# Patient Record
Sex: Female | Born: 1950
Health system: Southern US, Community
[De-identification: ages and names within clinical notes are randomized; demographics above are authoritative.]

## PROBLEM LIST (undated history)

## (undated) DIAGNOSIS — E785 Hyperlipidemia, unspecified: Secondary | ICD-10-CM

## (undated) DIAGNOSIS — I10 Essential (primary) hypertension: Secondary | ICD-10-CM

## (undated) DIAGNOSIS — R87629 Unspecified abnormal cytological findings in specimens from vagina: Secondary | ICD-10-CM

## (undated) DIAGNOSIS — R112 Nausea with vomiting, unspecified: Secondary | ICD-10-CM

## (undated) DIAGNOSIS — M1990 Unspecified osteoarthritis, unspecified site: Secondary | ICD-10-CM

## (undated) DIAGNOSIS — K219 Gastro-esophageal reflux disease without esophagitis: Secondary | ICD-10-CM

## (undated) DIAGNOSIS — E876 Hypokalemia: Secondary | ICD-10-CM

## (undated) DIAGNOSIS — Z9889 Other specified postprocedural states: Secondary | ICD-10-CM

## (undated) DIAGNOSIS — K635 Polyp of colon: Secondary | ICD-10-CM

## (undated) HISTORY — PX: TUBAL LIGATION: SHX77

## (undated) HISTORY — PX: TOE SURGERY: SHX1073

## (undated) HISTORY — DX: Essential (primary) hypertension: I10

## (undated) HISTORY — DX: Unspecified osteoarthritis, unspecified site: M19.90

## (undated) HISTORY — DX: Hyperlipidemia, unspecified: E78.5

## (undated) HISTORY — PX: BREAST BIOPSY: SHX20

## (undated) HISTORY — PX: BREAST SURGERY: SHX581

## (undated) HISTORY — DX: Hypokalemia: E87.6

## (undated) HISTORY — PX: NEUROMA SURGERY: SHX722

## (undated) HISTORY — DX: Unspecified abnormal cytological findings in specimens from vagina: R87.629

## (undated) HISTORY — PX: ABDOMINAL HYSTERECTOMY: SHX81

## (undated) HISTORY — PX: HYSTEROSCOPY: SHX211

## (undated) HISTORY — PX: HAMMER TOE SURGERY: SHX385

## (undated) HISTORY — PX: DILATION AND CURETTAGE OF UTERUS: SHX78

## (undated) HISTORY — PX: APPENDECTOMY: SHX54

---

## 1997-08-12 ENCOUNTER — Other Ambulatory Visit: Admission: RE | Admit: 1997-08-12 | Discharge: 1997-08-12 | Payer: Self-pay | Admitting: *Deleted

## 1998-04-19 ENCOUNTER — Ambulatory Visit (HOSPITAL_COMMUNITY): Admission: RE | Admit: 1998-04-19 | Discharge: 1998-04-19 | Payer: Self-pay | Admitting: *Deleted

## 1998-04-19 ENCOUNTER — Encounter: Payer: Self-pay | Admitting: *Deleted

## 1999-04-21 ENCOUNTER — Encounter: Payer: Self-pay | Admitting: Internal Medicine

## 1999-04-21 ENCOUNTER — Ambulatory Visit (HOSPITAL_COMMUNITY): Admission: RE | Admit: 1999-04-21 | Discharge: 1999-04-21 | Payer: Self-pay | Admitting: Internal Medicine

## 2000-05-02 ENCOUNTER — Encounter: Payer: Self-pay | Admitting: *Deleted

## 2000-05-02 ENCOUNTER — Ambulatory Visit (HOSPITAL_COMMUNITY): Admission: RE | Admit: 2000-05-02 | Discharge: 2000-05-02 | Payer: Self-pay | Admitting: *Deleted

## 2000-09-03 ENCOUNTER — Other Ambulatory Visit: Admission: RE | Admit: 2000-09-03 | Discharge: 2000-09-03 | Payer: Self-pay | Admitting: Obstetrics and Gynecology

## 2001-05-07 ENCOUNTER — Encounter: Admission: RE | Admit: 2001-05-07 | Discharge: 2001-05-07 | Payer: Self-pay | Admitting: Internal Medicine

## 2001-05-07 ENCOUNTER — Encounter: Payer: Self-pay | Admitting: Internal Medicine

## 2001-09-04 ENCOUNTER — Other Ambulatory Visit: Admission: RE | Admit: 2001-09-04 | Discharge: 2001-09-04 | Payer: Self-pay | Admitting: Obstetrics and Gynecology

## 2002-05-08 ENCOUNTER — Encounter: Admission: RE | Admit: 2002-05-08 | Discharge: 2002-05-08 | Payer: Self-pay | Admitting: Internal Medicine

## 2002-05-08 ENCOUNTER — Encounter: Payer: Self-pay | Admitting: Internal Medicine

## 2002-09-09 ENCOUNTER — Other Ambulatory Visit: Admission: RE | Admit: 2002-09-09 | Discharge: 2002-09-09 | Payer: Self-pay | Admitting: *Deleted

## 2003-05-11 ENCOUNTER — Encounter: Admission: RE | Admit: 2003-05-11 | Discharge: 2003-05-11 | Payer: Self-pay | Admitting: Internal Medicine

## 2003-09-10 ENCOUNTER — Other Ambulatory Visit: Admission: RE | Admit: 2003-09-10 | Discharge: 2003-09-10 | Payer: Self-pay | Admitting: Obstetrics and Gynecology

## 2004-01-21 ENCOUNTER — Ambulatory Visit: Payer: Self-pay | Admitting: Internal Medicine

## 2004-02-03 ENCOUNTER — Other Ambulatory Visit: Admission: RE | Admit: 2004-02-03 | Discharge: 2004-02-03 | Payer: Self-pay | Admitting: Obstetrics & Gynecology

## 2004-02-03 ENCOUNTER — Other Ambulatory Visit: Admission: RE | Admit: 2004-02-03 | Discharge: 2004-02-03 | Payer: Self-pay | Admitting: Obstetrics and Gynecology

## 2004-04-25 ENCOUNTER — Encounter: Admission: RE | Admit: 2004-04-25 | Discharge: 2004-04-25 | Payer: Self-pay | Admitting: Internal Medicine

## 2004-05-13 ENCOUNTER — Encounter: Admission: RE | Admit: 2004-05-13 | Discharge: 2004-05-13 | Payer: Self-pay | Admitting: Internal Medicine

## 2004-06-27 ENCOUNTER — Other Ambulatory Visit: Admission: RE | Admit: 2004-06-27 | Discharge: 2004-06-27 | Payer: Self-pay | Admitting: Obstetrics & Gynecology

## 2004-12-26 ENCOUNTER — Other Ambulatory Visit: Admission: RE | Admit: 2004-12-26 | Discharge: 2004-12-26 | Payer: Self-pay | Admitting: Obstetrics & Gynecology

## 2005-01-23 ENCOUNTER — Ambulatory Visit: Payer: Self-pay | Admitting: Internal Medicine

## 2005-05-19 ENCOUNTER — Encounter: Admission: RE | Admit: 2005-05-19 | Discharge: 2005-05-19 | Payer: Self-pay | Admitting: Internal Medicine

## 2005-06-27 ENCOUNTER — Other Ambulatory Visit: Admission: RE | Admit: 2005-06-27 | Discharge: 2005-06-27 | Payer: Self-pay | Admitting: Obstetrics & Gynecology

## 2005-07-31 ENCOUNTER — Encounter: Admission: RE | Admit: 2005-07-31 | Discharge: 2005-07-31 | Payer: Self-pay | Admitting: Obstetrics & Gynecology

## 2005-10-05 ENCOUNTER — Ambulatory Visit (HOSPITAL_COMMUNITY): Admission: RE | Admit: 2005-10-05 | Discharge: 2005-10-05 | Payer: Self-pay | Admitting: Obstetrics & Gynecology

## 2005-10-05 ENCOUNTER — Encounter (INDEPENDENT_AMBULATORY_CARE_PROVIDER_SITE_OTHER): Payer: Self-pay | Admitting: *Deleted

## 2005-12-18 ENCOUNTER — Encounter (INDEPENDENT_AMBULATORY_CARE_PROVIDER_SITE_OTHER): Payer: Self-pay | Admitting: *Deleted

## 2005-12-18 LAB — CONVERTED CEMR LAB

## 2006-01-24 ENCOUNTER — Ambulatory Visit: Payer: Self-pay | Admitting: Internal Medicine

## 2006-01-24 LAB — CONVERTED CEMR LAB
ALT: 28 units/L (ref 0–40)
Alkaline Phosphatase: 64 units/L (ref 39–117)
BUN: 14 mg/dL (ref 6–23)
Calcium: 9.8 mg/dL (ref 8.4–10.5)
Chloride: 102 meq/L (ref 96–112)
Cholesterol: 150 mg/dL (ref 0–200)
GFR calc non Af Amer: 70 mL/min
Glomerular Filtration Rate, Af Am: 84 mL/min/{1.73_m2}
HDL: 55 mg/dL (ref >39.0)
Hemoglobin: 14.7 g/dL (ref 12.0–15.0)
LDL Cholesterol: 86 mg/dL (ref 0–99)
Monocytes Absolute: 0.3 10*3/uL (ref 0.2–0.7)
Monocytes Relative: 5.4 % (ref 3.0–11.0)
Neutro Abs: 3.2 10*3/uL (ref 1.4–7.7)
RDW: 11 % — ABNORMAL LOW (ref 11.5–14.6)
Sodium: 139 meq/L (ref 135–145)
TSH: 1 microintl units/mL (ref 0.35–5.50)
WBC: 4.9 10*3/uL (ref 4.5–10.5)

## 2006-03-20 HISTORY — PX: COLONOSCOPY: SHX174

## 2006-03-28 ENCOUNTER — Encounter: Admission: RE | Admit: 2006-03-28 | Discharge: 2006-03-28 | Payer: Self-pay | Admitting: Internal Medicine

## 2006-05-22 ENCOUNTER — Encounter: Admission: RE | Admit: 2006-05-22 | Discharge: 2006-05-22 | Payer: Self-pay | Admitting: Obstetrics & Gynecology

## 2007-02-04 ENCOUNTER — Encounter: Admission: RE | Admit: 2007-02-04 | Discharge: 2007-02-04 | Payer: Self-pay | Admitting: Obstetrics & Gynecology

## 2007-04-15 ENCOUNTER — Encounter (INDEPENDENT_AMBULATORY_CARE_PROVIDER_SITE_OTHER): Payer: Self-pay | Admitting: *Deleted

## 2007-04-15 DIAGNOSIS — Z8639 Personal history of other endocrine, nutritional and metabolic disease: Secondary | ICD-10-CM

## 2007-04-15 DIAGNOSIS — R87619 Unspecified abnormal cytological findings in specimens from cervix uteri: Secondary | ICD-10-CM | POA: Insufficient documentation

## 2007-04-15 DIAGNOSIS — M1990 Unspecified osteoarthritis, unspecified site: Secondary | ICD-10-CM | POA: Insufficient documentation

## 2007-04-15 DIAGNOSIS — Z862 Personal history of diseases of the blood and blood-forming organs and certain disorders involving the immune mechanism: Secondary | ICD-10-CM | POA: Insufficient documentation

## 2007-04-16 ENCOUNTER — Ambulatory Visit: Payer: Self-pay | Admitting: Internal Medicine

## 2007-04-16 DIAGNOSIS — N926 Irregular menstruation, unspecified: Secondary | ICD-10-CM | POA: Insufficient documentation

## 2007-04-16 DIAGNOSIS — E785 Hyperlipidemia, unspecified: Secondary | ICD-10-CM | POA: Insufficient documentation

## 2007-04-16 DIAGNOSIS — T887XXA Unspecified adverse effect of drug or medicament, initial encounter: Secondary | ICD-10-CM | POA: Insufficient documentation

## 2007-04-16 DIAGNOSIS — I10 Essential (primary) hypertension: Secondary | ICD-10-CM | POA: Insufficient documentation

## 2007-04-22 ENCOUNTER — Encounter (INDEPENDENT_AMBULATORY_CARE_PROVIDER_SITE_OTHER): Payer: Self-pay | Admitting: *Deleted

## 2007-04-29 ENCOUNTER — Encounter (INDEPENDENT_AMBULATORY_CARE_PROVIDER_SITE_OTHER): Payer: Self-pay | Admitting: *Deleted

## 2007-05-07 ENCOUNTER — Ambulatory Visit: Payer: Self-pay | Admitting: Internal Medicine

## 2007-05-23 ENCOUNTER — Encounter: Admission: RE | Admit: 2007-05-23 | Discharge: 2007-05-23 | Payer: Self-pay | Admitting: Internal Medicine

## 2007-08-05 ENCOUNTER — Telehealth (INDEPENDENT_AMBULATORY_CARE_PROVIDER_SITE_OTHER): Payer: Self-pay | Admitting: *Deleted

## 2007-08-07 ENCOUNTER — Encounter: Admission: RE | Admit: 2007-08-07 | Discharge: 2007-08-07 | Payer: Self-pay | Admitting: Radiology

## 2007-08-19 ENCOUNTER — Ambulatory Visit: Payer: Self-pay | Admitting: Internal Medicine

## 2007-08-27 ENCOUNTER — Encounter: Payer: Self-pay | Admitting: Internal Medicine

## 2007-09-18 ENCOUNTER — Ambulatory Visit (HOSPITAL_COMMUNITY): Admission: RE | Admit: 2007-09-18 | Discharge: 2007-09-19 | Payer: Self-pay | Admitting: Surgery

## 2007-09-18 ENCOUNTER — Encounter (INDEPENDENT_AMBULATORY_CARE_PROVIDER_SITE_OTHER): Payer: Self-pay | Admitting: Surgery

## 2007-09-18 HISTORY — PX: CHOLECYSTECTOMY: SHX55

## 2007-10-21 ENCOUNTER — Encounter: Payer: Self-pay | Admitting: Internal Medicine

## 2008-02-06 ENCOUNTER — Ambulatory Visit: Payer: Self-pay | Admitting: Family Medicine

## 2008-02-24 ENCOUNTER — Encounter: Admission: RE | Admit: 2008-02-24 | Discharge: 2008-02-24 | Payer: Self-pay | Admitting: Obstetrics & Gynecology

## 2008-03-20 HISTORY — PX: NEURECTOMY FOOT: SUR888

## 2008-03-23 ENCOUNTER — Encounter: Admission: RE | Admit: 2008-03-23 | Discharge: 2008-03-23 | Payer: Self-pay

## 2008-04-22 ENCOUNTER — Ambulatory Visit: Payer: Self-pay | Admitting: Internal Medicine

## 2008-04-22 DIAGNOSIS — E559 Vitamin D deficiency, unspecified: Secondary | ICD-10-CM | POA: Insufficient documentation

## 2008-04-27 ENCOUNTER — Encounter (INDEPENDENT_AMBULATORY_CARE_PROVIDER_SITE_OTHER): Payer: Self-pay | Admitting: *Deleted

## 2008-04-28 ENCOUNTER — Telehealth (INDEPENDENT_AMBULATORY_CARE_PROVIDER_SITE_OTHER): Payer: Self-pay | Admitting: *Deleted

## 2008-04-29 ENCOUNTER — Ambulatory Visit: Payer: Self-pay | Admitting: Internal Medicine

## 2008-04-29 LAB — CONVERTED CEMR LAB: OCCULT 3: NEGATIVE

## 2008-04-30 ENCOUNTER — Encounter (INDEPENDENT_AMBULATORY_CARE_PROVIDER_SITE_OTHER): Payer: Self-pay | Admitting: *Deleted

## 2008-05-25 ENCOUNTER — Encounter: Admission: RE | Admit: 2008-05-25 | Discharge: 2008-05-25 | Payer: Self-pay | Admitting: Internal Medicine

## 2008-05-29 ENCOUNTER — Encounter: Admission: RE | Admit: 2008-05-29 | Discharge: 2008-05-29 | Payer: Self-pay | Admitting: Internal Medicine

## 2008-08-27 ENCOUNTER — Ambulatory Visit: Payer: Self-pay | Admitting: Internal Medicine

## 2008-08-31 LAB — CONVERTED CEMR LAB
ALT: 25 units/L (ref 0–35)
AST: 30 units/L (ref 0–37)
Alkaline Phosphatase: 57 units/L (ref 39–117)
Cholesterol: 175 mg/dL (ref 0–200)
HDL: 61 mg/dL (ref >39.00)
LDL Cholesterol: 103 mg/dL — ABNORMAL HIGH (ref 0–99)
Triglycerides: 54 mg/dL (ref 0.0–149.0)
VLDL: 10.8 mg/dL (ref 0.0–40.0)

## 2008-09-01 ENCOUNTER — Encounter (INDEPENDENT_AMBULATORY_CARE_PROVIDER_SITE_OTHER): Payer: Self-pay | Admitting: *Deleted

## 2009-01-28 ENCOUNTER — Telehealth: Payer: Self-pay | Admitting: Internal Medicine

## 2009-03-20 HISTORY — PX: BUNIONECTOMY: SHX129

## 2009-03-23 ENCOUNTER — Encounter: Admission: RE | Admit: 2009-03-23 | Discharge: 2009-03-23 | Payer: Self-pay | Admitting: Obstetrics & Gynecology

## 2009-04-23 ENCOUNTER — Ambulatory Visit: Payer: Self-pay | Admitting: Internal Medicine

## 2009-05-31 ENCOUNTER — Encounter: Admission: RE | Admit: 2009-05-31 | Discharge: 2009-05-31 | Payer: Self-pay | Admitting: Obstetrics & Gynecology

## 2010-04-10 ENCOUNTER — Encounter: Payer: Self-pay | Admitting: Internal Medicine

## 2010-04-17 LAB — CONVERTED CEMR LAB
ALT: 21 units/L (ref 0–35)
ALT: 24 units/L (ref 0–35)
AST: 22 units/L (ref 0–37)
AST: 23 units/L (ref 0–37)
Albumin: 4.2 g/dL (ref 3.5–5.2)
Alkaline Phosphatase: 48 units/L (ref 39–117)
Alkaline Phosphatase: 64 units/L (ref 39–117)
BUN: 13 mg/dL (ref 6–23)
Basophils Absolute: 0 10*3/uL (ref 0.0–0.1)
Basophils Absolute: 0 10*3/uL (ref 0.0–0.1)
Basophils Relative: 0.4 % (ref 0.0–3.0)
Basophils Relative: 0.5 % (ref 0.0–3.0)
Bilirubin, Direct: 0.1 mg/dL (ref 0.0–0.3)
Bilirubin, Direct: 0.1 mg/dL (ref 0.0–0.3)
CO2: 33 meq/L — ABNORMAL HIGH (ref 19–32)
Chloride: 103 meq/L (ref 96–112)
Chloride: 104 meq/L (ref 96–112)
Creatinine, Ser: 0.9 mg/dL (ref 0.4–1.2)
Eosinophils Absolute: 0.1 10*3/uL (ref 0.0–0.6)
Eosinophils Absolute: 0.1 10*3/uL (ref 0.0–0.7)
Eosinophils Relative: 1.2 % (ref 0.0–5.0)
GFR calc Af Amer: 67 mL/min
GFR calc Af Amer: 84 mL/min
GFR calc non Af Amer: 55 mL/min
GFR calc non Af Amer: 60.81 mL/min (ref >60)
Glucose, Bld: 76 mg/dL (ref 70–99)
Glucose, Bld: 87 mg/dL (ref 70–99)
HCT: 44 % (ref 36.0–46.0)
HCT: 44.6 % (ref 36.0–46.0)
HDL goal, serum: 40 mg/dL
HDL: 54.4 mg/dL (ref >39.0)
HDL: 67 mg/dL (ref >39.00)
Hemoglobin: 14.6 g/dL (ref 12.0–15.0)
LDL Goal: 130 mg/dL
LDL Goal: 160 mg/dL
Lymphocytes Relative: 23.7 % (ref 12.0–46.0)
MCHC: 33.3 g/dL (ref 30.0–36.0)
MCHC: 34.6 g/dL (ref 30.0–36.0)
MCV: 98.6 fL (ref 78.0–100.0)
Monocytes Absolute: 0.3 10*3/uL (ref 0.1–1.0)
Monocytes Relative: 6 % (ref 3.0–12.0)
Neutrophils Relative %: 68.7 % (ref 43.0–77.0)
Platelets: 258 10*3/uL (ref 150.0–400.0)
Potassium: 3.8 meq/L (ref 3.5–5.1)
Potassium: 4.1 meq/L (ref 3.5–5.1)
Potassium: 4.2 meq/L (ref 3.5–5.1)
Sodium: 140 meq/L (ref 135–145)
TSH: 0.83 microintl units/mL (ref 0.35–5.50)
Total Bilirubin: 0.8 mg/dL (ref 0.3–1.2)
Total CHOL/HDL Ratio: 3.7
Total Protein: 6.8 g/dL (ref 6.0–8.3)
Triglycerides: 85 mg/dL (ref 0–149)
VLDL: 13.4 mg/dL (ref 0.0–40.0)
VLDL: 17 mg/dL (ref 0–40)
Vit D, 1,25-Dihydroxy: 43 (ref 30–89)
WBC: 5.3 10*3/uL (ref 4.5–10.5)
WBC: 6 10*3/uL (ref 4.5–10.5)

## 2010-04-19 NOTE — Assessment & Plan Note (Signed)
Summary: cpx/alr   Vital Signs:  Patient profile:   60 year old female Height:      62.75 inches Weight:      162.8 pounds BMI:     29.17 Temp:     98.4 degrees F oral Pulse rate:   59 / minute Resp:     14 per minute BP sitting:   118 / 78  (left arm) Cuff size:   large  Vitals Entered By: Shonna Chock (April 23, 2009 8:29 AM) CC: CPX with fasting labs and refill meds , Lipid Management Comments REVIEWED MED LIST, PATIENT AGREED DOSE AND INSTRUCTION CORRECT    Primary Care Provider:  Marga Melnick MD  CC:  CPX with fasting labs and refill meds  and Lipid Management.  History of Present Illness: [name] is here for a physical; she is asymptomatic except for several months of intermittent  L thigh pain @ night which awakens her. Minor daytime symptoms  occasionally. Mobilization  & NSAIDS help.  Lipid Management History:      Positive NCEP/ATP III risk factors include female age 7 years old or older and hypertension.  Negative NCEP/ATP III risk factors include no history of early menopause without estrogen hormone replacement, non-diabetic, HDL cholesterol greater than 60, no family history for ischemic heart disease, non-tobacco-user status, no ASHD (atherosclerotic heart disease), no prior stroke/TIA, no peripheral vascular disease, and no history of aortic aneurysm.     Allergies: 1)  ! Tetracycline 2)  ! * Emycin 3)  ! Maxzide  Past History:  Past Medical History: Hyperlipidemia: NMR : LDL 161(0960/454), HDL 59,TG 69. LDL goal = < 130,ideally < 100. Hypertension history of abnormal pap,mon itored annually by  Dr Jennette Kettle history of hypokalemia degenerative joint disease Vitamin D deficiency( 1600 International Units  daily )  Past Surgical History: Tubal ligation G2 P2 Toe surgery for bone spur Colonoscopy, VIRTUAL  (04/2004) negative Hysteroscopy to assess Prometrium  therapy and dysmenorrhea (09/2005) hysteroscopy  with D and C in  7/07, 12/08 & 03/2008(+ polyp  removal); IUD insertion 09/2008, Dr Jennette Kettle neuroma left foot Dr. Ralene Cork  ,UJ:[id number] injections 2009; Bunionectomy,Hammer Toe ,& Neurectomy  L foot 03/2008; Bunionectomy 03/2009 Cholecystectomy,Lap 09/2007,Dr Gerkin  Family History: Father: CABG 4-vessel, MI @ 29; Mother: deceased Lung CA (former smoker); Siblings: sister morbid obesity,DJD; PGM:  deceased arrhymthmia age 39, MI; PGF:  deceased age 25 CVA; MGM:  breast CA, osteoporosis; MGF:  deceased Lung CA (former smoker); Maternal aunt:  breast CA  Social History: Never Smoked Heart Healthy Diet Occupation: Nurse,[employer] Married Regular exercise-no due to foot surgery  Review of Systems General:  Denies chills, fatigue, fever, sweats, and weight loss. Eyes:  Denies blurring, double vision, and vision loss-both eyes. ENT:  Complains of nasal congestion; denies decreased hearing, ringing in ears, and sinus pressure; Perennial congestion ? from wood dust. CV:  Denies bluish discoloration of lips or nails, chest pain or discomfort, difficulty breathing at night, difficulty breathing while lying down, leg cramps with exertion, palpitations, shortness of breath with exertion, swelling of feet, and swelling of hands. Resp:  Denies cough, shortness of breath, sputum productive, and wheezing. GI:  Denies abdominal pain, bloody stools, dark tarry stools, and indigestion. GU:  Denies discharge, dysuria, and hematuria; Nocturia X 1. MS:  See HPI; Complains of joint pain; denies joint redness, joint swelling, low back pain, mid back pain, and thoracic pain; ? weakness L thigh intermittently. Derm:  Denies changes in nail beds, dryness, hair  loss, lesion(s), and rash. Neuro:  Denies brief paralysis, numbness, and tingling. Psych:  Denies anxiety, depression, easily angered, easily tearful, and irritability. Endo:  Denies cold intolerance, excessive hunger, excessive thirst, excessive urination, and heat intolerance. Heme:  Denies abnormal  bruising and bleeding. Allergy:  Complains of itching eyes, seasonal allergies, and sneezing; Visine & artificial tears as needed .  Physical Exam  General:  well-nourished,in no acute distress; alert,appropriate and cooperative throughout examination Head:  Normocephalic and atraumatic without obvious abnormalities.  Eyes:  No corneal or conjunctival inflammation noted. Perrla. Funduscopic exam benign, without hemorrhages, exudates or papilledema.  Ears:  External ear exam shows no significant lesions or deformities.  Otoscopic examination reveals clear canals, tympanic membranes are intact bilaterally without bulging, retraction, inflammation or discharge. Hearing is grossly normal bilaterally. Nose:  External nasal examination shows no deformity or inflammation. Nasal mucosa are  dry , esp on L ,without lesions or exudates. Mouth:  Oral mucosa and oropharynx without lesions or exudates.  Teeth in good repair. Neck:  No deformities, masses, or tenderness noted. Lungs:  Normal respiratory effort, chest expands symmetrically. Lungs are clear to auscultation, no crackles or wheezes. Heart:  Normal rate and regular rhythm. S1 and S2 normal without gallop, murmur, click, rub . S4 Abdomen:  Bowel sounds positive,abdomen soft and non-tender without masses, organomegaly or hernias noted. Genitalia:  Dr Jennette Kettle Msk:  No deformity or scoliosis noted of thoracic or lumbar spine.   Pulses:  R and L carotid,dorsalis pedis and posterior tibial pulses are full and equal bilaterally. Decreased radial arteries Extremities:  No clubbing, cyanosis, edema  noted with normal full range of motion of all joints.  DIP OA changes & crepitus in knees Neurologic:  alert & oriented X3, strength normal in all extremities, and DTRs symmetrical and normal.   Skin:  Intact without suspicious lesions or rashes Cervical Nodes:  No lymphadenopathy noted Axillary Nodes:  No palpable lymphadenopathy Psych:  memory intact for  recent and remote, normally interactive, and good eye contact.     Impression & Recommendations:  Problem # 1:  PREVENTIVE HEALTH CARE (ICD-V70.0)  Orders: EKG w/ Interpretation (93000) Venipuncture (16109) TLB-Lipid Panel (80061-LIPID) TLB-BMP (Basic Metabolic Panel-BMET) (80048-METABOL) TLB-CBC Platelet - w/Differential (85025-CBCD) TLB-Hepatic/Liver Function Pnl (80076-HEPATIC) TLB-TSH (Thyroid Stimulating Hormone) (84443-TSH) T-Vitamin D (25-Hydroxy) (60454-09811)  Problem # 2:  HIP PAIN, LEFT (ICD-719.45) probabbly related to DJD Her updated medication list for this problem includes:    Percocet 5-325 Mg Tabs (Oxycodone-acetaminophen) .Marland Kitchen... As needed  Problem # 3:  VITAMIN D DEFICIENCY (ICD-268.9)  Orders: Venipuncture (91478) T-Vitamin D (25-Hydroxy) (29562-13086)  Problem # 4:  HYPERTENSION, ESSENTIAL NOS (ICD-401.9)  Controlled Her updated medication list for this problem includes:    Spironolactone 25 Mg Tabs (Spironolactone) .Marland Kitchen... Take 1 tablet by mouth two times a day  Orders: EKG w/ Interpretation (93000) Venipuncture (57846)  Problem # 5:  HYPERLIPIDEMIA (ICD-272.2)  Her updated medication list for this problem includes:    Pravastatin Sodium 20 Mg Tabs (Pravastatin sodium) .Marland Kitchen... 1 at bedtime  Orders: Venipuncture (96295) TLB-Lipid Panel (80061-LIPID)  Problem # 6:  DEGENERATIVE JOINT DISEASE (ICD-715.90)  Her updated medication list for this problem includes:    Percocet 5-325 Mg Tabs (Oxycodone-acetaminophen) .Marland Kitchen... As needed  Complete Medication List: 1)  Folic Acid 800 Mcg Tabs (Folic acid) .... Take 1 tablet by mouth daily 2)  Caltrate 600+d Plus 600-400 Mg-unit Tabs (Calcium carbonate-vit d-min) .... Take 1 tablet by mouth daily 3)  Spironolactone 25 Mg  Tabs (Spironolactone) .... Take 1 tablet by mouth two times a day 4)  Estrogen Patch  5)  Super B Complex  6)  Vitamin D 1000iu  .... Qd 7)  Pepcid Ac 10 Mg Chew (Famotidine) .... As  needed. 8)  Percocet 5-325 Mg Tabs (Oxycodone-acetaminophen) .... As needed 9)  Fish Oil 1000 Mg Caps (Omega-3 fatty acids) .... 2 once daily 10)  Zolpidem Tartrate 5 Mg Tabs (Zolpidem tartrate) .Marland Kitchen.. 1 every 3rd night as needed 11)  Pravastatin Sodium 20 Mg Tabs (Pravastatin sodium) .Marland Kitchen.. 1 at bedtime 12)  Fluticasone Propionate 50 Mcg/act Susp (Fluticasone propionate) .Marland Kitchen.. 1 spray two times a day as needed  Lipid Assessment/Plan:      Based on NCEP/ATP III, the patient's risk factor category is "0-1 risk factors".  The patient's lipid goals are as follows: Total cholesterol goal is 200; LDL cholesterol goal is 130; HDL cholesterol goal is 40; Triglyceride goal is 150.  Her LDL cholesterol goal has been met.    Patient Instructions: 1)  AS Tylenol at bedtime as needed for thigh pain. Your LDL goal = < 130. Prescriptions: FLUTICASONE PROPIONATE 50 MCG/ACT SUSP (FLUTICASONE PROPIONATE) 1 spray two times a day as needed  #1 x 11   Entered and Authorized by:   Marga Melnick MD   Signed by:   Marga Melnick MD on 04/23/2009   Method used:   Print then Give to Patient   RxID:   [id number] PRAVASTATIN SODIUM 20 MG TABS (PRAVASTATIN SODIUM) 1 at bedtime  #90 Each x 3   Entered and Authorized by:   Marga Melnick MD   Signed by:   Marga Melnick MD on 04/23/2009   Method used:   Print then Give to Patient   RxID:   [id number] SPIRONOLACTONE 25 MG  TABS (SPIRONOLACTONE) take 1 tablet by mouth two times a day  #180 x 3   Entered and Authorized by:   Marga Melnick MD   Signed by:   Marga Melnick MD on 04/23/2009   Method used:   Print then Give to Patient   RxID:   [id number]

## 2010-04-28 ENCOUNTER — Other Ambulatory Visit: Payer: Self-pay | Admitting: Obstetrics & Gynecology

## 2010-04-28 DIAGNOSIS — Z1231 Encounter for screening mammogram for malignant neoplasm of breast: Secondary | ICD-10-CM

## 2010-05-09 ENCOUNTER — Other Ambulatory Visit: Payer: Self-pay | Admitting: Internal Medicine

## 2010-05-09 ENCOUNTER — Encounter (INDEPENDENT_AMBULATORY_CARE_PROVIDER_SITE_OTHER): Payer: PRIVATE HEALTH INSURANCE | Admitting: Internal Medicine

## 2010-05-09 ENCOUNTER — Encounter: Payer: Self-pay | Admitting: Internal Medicine

## 2010-05-09 DIAGNOSIS — E559 Vitamin D deficiency, unspecified: Secondary | ICD-10-CM

## 2010-05-09 DIAGNOSIS — Z0000 Encounter for general adult medical examination without abnormal findings: Secondary | ICD-10-CM

## 2010-05-09 DIAGNOSIS — E782 Mixed hyperlipidemia: Secondary | ICD-10-CM

## 2010-05-09 DIAGNOSIS — I10 Essential (primary) hypertension: Secondary | ICD-10-CM

## 2010-05-09 DIAGNOSIS — R635 Abnormal weight gain: Secondary | ICD-10-CM

## 2010-05-09 DIAGNOSIS — M79609 Pain in unspecified limb: Secondary | ICD-10-CM

## 2010-05-09 DIAGNOSIS — M1990 Unspecified osteoarthritis, unspecified site: Secondary | ICD-10-CM

## 2010-05-09 DIAGNOSIS — Z2911 Encounter for prophylactic immunotherapy for respiratory syncytial virus (RSV): Secondary | ICD-10-CM

## 2010-05-09 DIAGNOSIS — Z23 Encounter for immunization: Secondary | ICD-10-CM

## 2010-05-09 LAB — LIPID PANEL
Cholesterol: 167 mg/dL (ref 0–200)
HDL: 55.5 mg/dL (ref >39.00)
LDL Cholesterol: 100 mg/dL — ABNORMAL HIGH (ref 0–99)
Total CHOL/HDL Ratio: 3
Triglycerides: 60 mg/dL (ref 0.0–149.0)

## 2010-05-09 LAB — HEPATIC FUNCTION PANEL
ALT: 85 U/L — ABNORMAL HIGH (ref 0–35)
Alkaline Phosphatase: 78 U/L (ref 39–117)
Bilirubin, Direct: 0.1 mg/dL (ref 0.0–0.3)
Total Protein: 7 g/dL (ref 6.0–8.3)

## 2010-05-09 LAB — CBC WITH DIFFERENTIAL/PLATELET
Basophils Relative: 0.6 % (ref 0.0–3.0)
Eosinophils Absolute: 0.1 10*3/uL (ref 0.0–0.7)
Lymphocytes Relative: 25 % (ref 12.0–46.0)
MCHC: 34.6 g/dL (ref 30.0–36.0)
MCV: 97.5 fl (ref 78.0–100.0)
Monocytes Absolute: 0.3 10*3/uL (ref 0.1–1.0)
Neutrophils Relative %: 68.3 % (ref 43.0–77.0)
Platelets: 251 10*3/uL (ref 150.0–400.0)
RBC: 4.56 Mil/uL (ref 3.87–5.11)
WBC: 5.8 10*3/uL (ref 4.5–10.5)

## 2010-05-09 LAB — BASIC METABOLIC PANEL
BUN: 13 mg/dL (ref 6–23)
CO2: 29 mEq/L (ref 19–32)
Calcium: 9.9 mg/dL (ref 8.4–10.5)
Chloride: 104 mEq/L (ref 96–112)
Creatinine, Ser: 0.8 mg/dL (ref 0.4–1.2)

## 2010-05-10 LAB — CONVERTED CEMR LAB: Vit D, 25-Hydroxy: 64 ng/mL (ref 30–89)

## 2010-05-12 ENCOUNTER — Other Ambulatory Visit: Payer: Self-pay | Admitting: Internal Medicine

## 2010-05-12 ENCOUNTER — Ambulatory Visit
Admission: RE | Admit: 2010-05-12 | Discharge: 2010-05-12 | Disposition: A | Discharge status: Home / Self Care | Payer: PRIVATE HEALTH INSURANCE | Source: Ambulatory Visit | Attending: Internal Medicine | Admitting: Internal Medicine

## 2010-05-12 DIAGNOSIS — R52 Pain, unspecified: Secondary | ICD-10-CM

## 2010-05-17 NOTE — Assessment & Plan Note (Signed)
Summary: cpx/fasting/kn   Vital Signs:  Patient profile:   60 year old female Height:      63 inches Weight:      168.8 pounds BMI:     30.01 Temp:     98.3 degrees F oral Pulse rate:   60 / minute Resp:     14 per minute BP sitting:   102 / 70  (left arm) Cuff size:   large  Vitals Entered By: Shonna Chock CMA (May 09, 2010 8:31 AM) CC: CPX with fasting labs, refill meds , Lower Extremity Joint pain   Primary Care Provider:  Marga Melnick MD  CC:  CPX with fasting labs, refill meds , and Lower Extremity Joint pain.  History of Present Illness:    [name] is here for a physical ; she has  had significant L > R  thumb pain for 6 - 8 mos. Rx: NSAIDS help "some". She  reports weakness, but denies swelling, redness, giving away, locking, popping, stiffness for >1 hr, and decreased ROM.  The pain is described as aching and intermittent.  The patient denies the following symptoms: fever, rash, photosensitivity, eye symptoms, diarrhea, and dysuria.  Strong FH of OA.    Hyperlipidemia Follow-Up:  She  denies muscle aches, GI upset, abdominal pain, flushing, itching, constipation, and fatigue.  The patient denies the following symptoms: chest pain/pressure, exercise intolerance, dypsnea, palpitations, syncope, and pedal edema.  Compliance with medications (by patient report) has been near 100%.  Dietary compliance has been fair.  The patient reports exercising 2-3 X per week.  Adjunctive measures currently used by the patient include folic acid and fish oil supplements.     Hypertension Follow-Up: She denies lightheadedness, urinary frequency, and headaches.  Compliance with medications (by patient report) has been near 100%.  Adjunctive measures currently used by the patient include salt restriction.   BP 98-123/60-70s @ work.  Current Medications (verified): 1)  Folic Acid 800 Mcg  Tabs (Folic Acid) .... Take 1 Tablet By Mouth Daily 2)  Caltrate 600+d Plus 600-400 Mg-Unit  Tabs (Calcium  Carbonate-Vit D-Min) .... Take 1 Tablet By Mouth Daily 3)  Spironolactone 25 Mg  Tabs (Spironolactone) .... Take 1 Tablet By Mouth Two Times A Day 4)  Estrogen Patch 5)  Super B Complex 6)  Vitamin D 1000iu .... Qd 7)  Pepcid Ac 10 Mg  Chew (Famotidine) .... As Needed. 8)  Percocet 5-325 Mg Tabs (Oxycodone-Acetaminophen) .... As Needed 9)  Fish Oil 1000 Mg Caps (Omega-3 Fatty Acids) .... 2 Once Daily 10)  Zolpidem Tartrate 5 Mg Tabs (Zolpidem Tartrate) .Marland Kitchen.. 1 Every 3rd Night As Needed 11)  Pravastatin Sodium 20 Mg Tabs (Pravastatin Sodium) .Marland Kitchen.. 1 At Bedtime 12)  Fluticasone Propionate 50 Mcg/act Susp (Fluticasone Propionate) .Marland Kitchen.. 1 Spray Two Times A Day As Needed  Allergies: 1)  ! Tetracycline 2)  ! * Emycin 3)  ! Maxzide 4)  ! Betadine  Past History:  Past Medical History: Hyperlipidemia: NMR : LDL 120 (1267/584), HDL 59,TG 69. LDL goal = < 130,ideally < 100. Hypertension History of abnormal pap,mon itored annually by  Dr Jennette Kettle History of hypokalemia on HCTZ Degenerative joint disease Vitamin D deficiency, PMH of ( 1600 International Units  daily )  Past Surgical History: Tubal ligation G2 P2 Toe surgery for bone spur Colonoscopy, VIRTUAL  (04/2004) negative Hysteroscopy to assess Prometrium  therapy and dysmenorrhea (09/2005) Hysteroscopy  with D and C in  09/2005, 02/2007 & 03/2008 (+ polyp removal); IUD  insertion 09/2008, Dr Jennette Kettle Neuroma left foot Dr. Ralene Cork  ,[id number] injections 2009; Bunionectomy,Hammer Toe ,& Neurectomy  L foot 03/2008; Bunionectomy 03/2009 Cholecystectomy,Lap 09/2007,Dr Gerkin  Family History: Father: CABG 4-vessel, MI @ 76; Mother: deceased Lung  cancer  (former smoker); Siblings: sister: morbid obesity,DJD; PGM:  deceased arrhymthmia age 33, MI; PGF:  deceased age 23 CVA; MGM:  breast cancer , osteoporosis; MGF:  deceased Lung  cancer  (former smoker); Maternal aunt:  breast  cancer   Social History: Never Smoked Heart Healthy  Diet Occupation: Nurse,[employer] Married  Review of Systems  The patient denies anorexia, vision loss, decreased hearing, hoarseness, prolonged cough, hemoptysis, melena, hematochezia, severe indigestion/heartburn, hematuria, suspicious skin lesions, depression, abnormal bleeding, enlarged lymph nodes, and angioedema.         Weight up 20# over 18 months despite diets.  Physical Exam  General:  well-nourished; alert,appropriate and cooperative throughout examination Head:  Normocephalic and atraumatic without obvious abnormalities.  Eyes:  No corneal or conjunctival inflammation noted.  Perrla. Funduscopic exam benign, without hemorrhages, exudates or papilledema. Ears:  External ear exam shows no significant lesions or deformities.  Otoscopic examination reveals clear canals, tympanic membranes are intact bilaterally without bulging, retraction, inflammation or discharge. Hearing is grossly normal bilaterally. Nose:  External nasal examination shows no deformity or inflammation. Nasal mucosa are pink and moist without lesions or exudates. Mouth:  Oral mucosa and oropharynx without lesions or exudates.  Teeth in good repair. Neck:  No deformities, masses, or tenderness noted. Lungs:  Normal respiratory effort, chest expands symmetrically. Lungs are clear to auscultation, no crackles or wheezes. Heart:  Normal rate and regular rhythm. S1 and S2 normal without gallop, murmur, click, rub .Soft  S4 Abdomen:  Bowel sounds positive,abdomen soft and non-tender without masses, organomegaly or hernias noted. Genitalia:  Dr Konrad Dolores Msk:  No deformity or scoliosis noted of thoracic or lumbar spine.   Pulses:  R and L carotid,dorsalis pedis and posterior tibial pulses are full and equal bilaterally. Radial pulses difficult to palpate Extremities:  No clubbing, cyanosis, edema  noted . OA hand changes  & crepitus of L > R knee with normal full range of motion of all joints.   Neurologic:  alert &  oriented X3 and DTRs symmetrical and normal.   Skin:  Intact without suspicious lesions or rashes Cervical Nodes:  No lymphadenopathy noted Axillary Nodes:  No palpable lymphadenopathy Psych:  memory intact for recent and remote, normally interactive, and good eye contact.     Impression & Recommendations:  Problem # 1:  PREVENTIVE HEALTH CARE (ICD-V70.0)  Orders: EKG w/ Interpretation (93000) Venipuncture (40981) TLB-Lipid Panel (80061-LIPID) TLB-BMP (Basic Metabolic Panel-BMET) (80048-METABOL) TLB-CBC Platelet - w/Differential (85025-CBCD) TLB-Hepatic/Liver Function Pnl (80076-HEPATIC) TLB-TSH (Thyroid Stimulating Hormone) (84443-TSH) TLB-T4 (Thyrox), Free [phone]) T-Vitamin D (25-Hydroxy) (62130-86578)  Problem # 2:  THUMB PAIN, BILATERAL (ICD-729.5)  Orders: Radiology Referral (Radiology)  Problem # 3:  HYPERTENSION, ESSENTIAL NOS (ICD-401.9) controlled Her updated medication list for this problem includes:    Spironolactone 25 Mg Tabs (Spironolactone) .Marland Kitchen... Take 1 tablet by mouth two times a day  Problem # 4:  HYPERLIPIDEMIA (ICD-272.2)  Her updated medication list for this problem includes:    Pravastatin Sodium 20 Mg Tabs (Pravastatin sodium) .Marland Kitchen... 1 at bedtime  Problem # 5:  DEGENERATIVE JOINT DISEASE (ICD-715.90)  Her updated medication list for this problem includes:    Percocet 5-325 Mg Tabs (Oxycodone-acetaminophen) .Marland Kitchen... As needed  Problem # 6:  VITAMIN D DEFICIENCY (ICD-268.9)  Complete Medication List: 1)  Folic Acid 800 Mcg Tabs (Folic acid) .... Take 1 tablet by mouth daily 2)  Caltrate 600+d Plus 600-400 Mg-unit Tabs (Calcium carbonate-vit d-min) .... Take 1 tablet by mouth daily 3)  Spironolactone 25 Mg Tabs (Spironolactone) .... Take 1 tablet by mouth two times a day 4)  Estrogen Patch  5)  Super B Complex  6)  Vitamin D 1000iu  .... Qd 7)  Pepcid Ac 10 Mg Chew (Famotidine) .... As needed. 8)  Percocet 5-325 Mg Tabs (Oxycodone-acetaminophen)  .... As needed 9)  Fish Oil 1000 Mg Caps (Omega-3 fatty acids) .... 2 once daily 10)  Zolpidem Tartrate 5 Mg Tabs (Zolpidem tartrate) .Marland Kitchen.. 1 every 3rd night as needed 11)  Pravastatin Sodium 20 Mg Tabs (Pravastatin sodium) .Marland Kitchen.. 1 at bedtime 12)  Fluticasone Propionate 50 Mcg/act Susp (Fluticasone propionate) .Marland Kitchen.. 1 spray two times a day as needed  Other Orders: Zoster (Shingles) Vaccine Live [phone]) Admin 1st Vaccine (60454)  Patient Instructions: 1)  It is important that you exercise regularly at least 20 minutes 5 times a week. If you develop chest pain, have severe difficulty breathing, or feel very tired , stop exercising immediately and seek medical attention. 2)  Check your Blood Pressure regularly. If it is above:135/85 ON AVERAGE  you should make an appointment. Prescriptions: FLUTICASONE PROPIONATE 50 MCG/ACT SUSP (FLUTICASONE PROPIONATE) 1 spray two times a day as needed  #1 x 11   Entered and Authorized by:   Marga Melnick MD   Signed by:   Marga Melnick MD on 05/09/2010   Method used:   Print then Give to Patient   RxID:   [id number] PRAVASTATIN SODIUM 20 MG TABS (PRAVASTATIN SODIUM) 1 at bedtime  #90 Each x 3   Entered and Authorized by:   Marga Melnick MD   Signed by:   Marga Melnick MD on 05/09/2010   Method used:   Print then Give to Patient   RxID:   [id number] ZOLPIDEM TARTRATE 5 MG TABS (ZOLPIDEM TARTRATE) 1 every 3rd night as needed  #10 x 5   Entered and Authorized by:   Marga Melnick MD   Signed by:   Marga Melnick MD on 05/09/2010   Method used:   Print then Give to Patient   RxID:   [id number] SPIRONOLACTONE 25 MG  TABS (SPIRONOLACTONE) take 1 tablet by mouth two times a day  #180 x 3   Entered and Authorized by:   Marga Melnick MD   Signed by:   Marga Melnick MD on 05/09/2010   Method used:   Print then Give to Patient   RxID:   [id number]    Orders Added: 1)  Zoster (Shingles) Vaccine Live [90736] 2)  Admin 1st Vaccine  [90471] 3)  Est. Patient 40-64 years [99396] 4)  EKG w/ Interpretation [93000] 5)  Venipuncture [36415] 6)  TLB-Lipid Panel [80061-LIPID] 7)  TLB-BMP (Basic Metabolic Panel-BMET) [80048-METABOL] 8)  TLB-CBC Platelet - w/Differential [85025-CBCD] 9)  TLB-Hepatic/Liver Function Pnl [80076-HEPATIC] 10)  TLB-TSH (Thyroid Stimulating Hormone) [84443-TSH] 11)  TLB-T4 (Thyrox), Free [64332-RJ1O] 12)  T-Vitamin D (25-Hydroxy) [84166-06301] 13)  Radiology Referral [Radiology]   Immunizations Administered:  Zostavax # 1:    Vaccine Type: Zostavax    Site: Left Arm    Mfr: Merck    Dose: 0.68mL    Route: [place]    Given by: Shonna Chock CMA    Exp. Date: 04/02/2011    Lot #: [id number]    VIS given:  12/30/04 given May 09, 2010.   Immunizations Administered:  Zostavax # 1:    Vaccine Type: Zostavax    Site: Left Arm    Mfr: Merck    Dose: 0.74mL    Route: [place]    Given by: Shonna Chock CMA    Exp. Date: 04/02/2011    Lot #: [id number]    VIS given: 12/30/04 given May 09, 2010.    Appended Document: cpx/fasting/kn

## 2010-05-30 ENCOUNTER — Other Ambulatory Visit (INDEPENDENT_AMBULATORY_CARE_PROVIDER_SITE_OTHER): Payer: PRIVATE HEALTH INSURANCE

## 2010-05-30 ENCOUNTER — Encounter (INDEPENDENT_AMBULATORY_CARE_PROVIDER_SITE_OTHER): Payer: Self-pay | Admitting: *Deleted

## 2010-05-30 ENCOUNTER — Other Ambulatory Visit: Payer: Self-pay | Admitting: Internal Medicine

## 2010-05-30 DIAGNOSIS — R740 Nonspecific elevation of levels of transaminase and lactic acid dehydrogenase [LDH]: Secondary | ICD-10-CM

## 2010-05-30 DIAGNOSIS — R7401 Elevation of levels of liver transaminase levels: Secondary | ICD-10-CM

## 2010-05-30 LAB — ALT: ALT: 19 U/L (ref 0–35)

## 2010-06-02 ENCOUNTER — Ambulatory Visit: Payer: Self-pay

## 2010-06-09 ENCOUNTER — Ambulatory Visit
Admission: RE | Admit: 2010-06-09 | Discharge: 2010-06-09 | Disposition: A | Discharge status: Home / Self Care | Payer: PRIVATE HEALTH INSURANCE | Source: Ambulatory Visit | Attending: Obstetrics & Gynecology | Admitting: Obstetrics & Gynecology

## 2010-06-09 DIAGNOSIS — Z1231 Encounter for screening mammogram for malignant neoplasm of breast: Secondary | ICD-10-CM

## 2010-08-02 NOTE — Op Note (Signed)
[name], [name]             ACCOUNT NO.:  [id number]   MEDICAL RECORD NO.:  [id number]          PATIENT TYPE:  OIB   LOCATION:  5127                         FACILITY:  [facility]   PHYSICIAN:  Velora Heckler, MD      DATE OF BIRTH:  1950-10-15   DATE OF PROCEDURE:  09/18/2007  DATE OF DISCHARGE:                               OPERATIVE REPORT   PREOPERATIVE DIAGNOSIS:  Symptomatic cholelithiasis.   POSTOPERATIVE DIAGNOSIS:  Symptomatic cholelithiasis.   PROCEDURE:  Laparoscopic cholecystectomy with intraoperative  cholangiography.   SURGEON:  Velora Heckler, MD, FACS   ASSISTANT:  Magnus Ivan, RNFA   ANESTHESIA:  General per Dr. Arta Bruce.   ESTIMATED BLOOD LOSS:  Minimal.   PREPARATION:  Betadine.   COMPLICATIONS:  None.   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  She is employed at [employer].  She is  referred by Dr. Marga Melnick for symptomatic gallstones.  The patient  had initial episode of biliary colic in May 2009.  She now comes to  surgery for cholecystectomy.   BODY OF REPORT:  Procedure is done in OR #16 at the [facility].  The patient was brought to the operating room,  placed in supine position on the operating room table.  Following  administration of general anesthesia, the patient is positioned and then  prepped and draped in usual strict aseptic fashion.  After ascertaining  that an adequate level of anesthesia has been achieved, an  infraumbilical incision was made, the midline with a #15 blade.  Dissection was carried through subcutaneous tissues.  Fascia was incised  in the midline and the peritoneal cavity is entered cautiously.  A 0  Vicryl purse-string suture is placed in the fascia.  An Hasson cannula  was introduced and secured with a purse-string suture.  Abdomen was  insufflated with carbon dioxide.  Laparoscope was introduced.  Abdomen  was explored.   Operative ports were placed along the right costal margin  in the midline, midclavicular line, and anterior axillary line.  Fundus  of the gallbladder was grasped and retracted cephalad.  There are  adhesions to the undersurface of the gallbladder.  These were taken down  with gentle blunt dissection.  Gallbladder is completely exposed.  There  were stones in the neck of the gallbladder which were visible.  Gallbladder was gently elevated.  Dissection was begun at the neck of  the gallbladder and peritoneum was incised.  Cystic duct just dissected  out.  Shoulder between the cystic duct and the common duct is  identified.  Clip was placed at the neck of the gallbladder.  Cystic  duct is incised and clear yellow bile emanates from the cystic duct.  A  Cook cholangiography catheter was inserted into the cystic duct and  secured with a Ligaclip.  Using C-arm fluoroscopy, real time  cholangiography was performed.  There was rapid filling of a slightly  dilated-appearing common bile duct.  There is free flow distally into  the duodenum without filling defect or obstruction.  There is reflux of  contrast into both the right and left hepatic ductal systems.  Clip was  withdrawn and Cook catheter was removed from the peritoneal cavity.  Cystic duct is triply clipped and then divided.  Cystic artery was  dissected out, doubly clipped, and divided.  Posterior branch of the  cystic artery was clipped and then divided with the electrocautery.  Gallbladder was excised from the gallbladder bed using the hook  electrocautery for hemostasis.  Gallbladder was completely excised and  placed and removed through the umbilical port without difficulty.  It  contains multiple gallstones.  Right upper quadrant was copiously  irrigated with warm saline.  Saline was evacuated.  Good hemostasis was  noted.  Ports were removed under direct vision and good hemostasis was  noted at all port sites.  Pneumoperitoneum was  released.  0 Vicryl purse-  string suture was tied securely.  Wounds were anesthetized with local  anesthetic.  Wounds were closed with interrupted 4-0 Monocryl  subcuticular sutures.  Wounds were washed and dried and Benzoin and  Steri-Strips were applied.  Sterile dressings were applied.  The patient  was awakened from anesthesia and brought to the recovery room in stable  condition.  The patient tolerated the procedure well.      Velora Heckler, MD  Electronically Signed     TMG/MEDQ  D:  09/18/2007  T:  09/19/2007  Job:  [id number]   cc:   Titus Dubin. Alwyn Ren, MD,FACP,FCCP

## 2010-08-05 NOTE — Op Note (Signed)
[name], [name]             ACCOUNT NO.:  [id number]   MEDICAL RECORD NO.:  [id number]          PATIENT TYPE:  AMB   LOCATION:  SDC                           FACILITY:  [facility]   PHYSICIAN:  Freddy Finner, M.D.   DATE OF BIRTH:  Jul 26, 1950   DATE OF PROCEDURE:  10/05/2005  DATE OF DISCHARGE:                                 OPERATIVE REPORT   PREOPERATIVE DIAGNOSES:  1.  Postmenopausal bleeding.  2.  Suggestion of endometrial polyp at transvaginal pelvic ultrasound done      at [facility].   POSTOPERATIVE DIAGNOSIS:  Thickened area of endometrium in posterior fundus   OPERATIVE PROCEDURE:  Hysteroscopy and dilatation and curettage.   SURGEON:  Dr. Jennette Kettle.   ANESTHESIA:  Mask IV sedation and paracervical block.   INTRAOPERATIVE COMPLICATIONS:  None.   INTRAOPERATIVE BLOOD LOSS:  Less than or equal to 50 mL.   SORBITOL DEFICIT:  Less than or equal to 100 mL.   The patient is a 60 year old on hormone replacement therapy, who has had  some postmenopausal bleeding and had ultrasound done at [facility]  with findings as noted above.  She is admitted now for hysteroscopy and D&C.  She was admitted on the morning of surgery.  She was brought to the  operating room and placed under adequate IV sedation, placed in the dorsal  lithotomy position using Allen stirrup system.  Betadine prep to mons,  perineum and vagina was carried out in the usual fashion.  The cervix was  visualized using bi-valve speculum and grasped on the anterior lip with a  single-tooth tenaculum.  Paracervical block was placed using a total of 10  cc of 1% Xylocaine.  Injections were made at 4 and 8 o'clock in the vaginal  fornices.  The uterus sounded to 9 cm.  The cervix was progressively dilated  with Shawnie Pons dilators to 23.  The 12.5 degree hysteroscope was introduced  using Sorbitol as a distending medium.  Findings were as noted above.  There  was a little thickening of the posterior  endometrium high on the posterior  fundal wall.  Gentle thorough curettage was carried out followed by  exploration with Randall stone forceps.  Inspection with hysteroscope  revealed adequate sampling of the endometrium and resolution of thickening  noted above.  The procedure was terminated and instruments removed.  The  patient was taken to the recovery room in good condition.  She should be  given routine outpatient surgical instructions.  She is to follow up in the  office in approximately 2 weeks.  She has Darvocet to be taken as needed for  postoperative pain.     Freddy Finner, M.D.  Electronically Signed    WRN/MEDQ  D:  10/05/2005  T:  10/05/2005  Job:  [id number]

## 2010-09-01 ENCOUNTER — Encounter: Payer: Self-pay | Admitting: Internal Medicine

## 2010-09-02 ENCOUNTER — Encounter: Payer: Self-pay | Admitting: Internal Medicine

## 2010-09-02 ENCOUNTER — Ambulatory Visit (INDEPENDENT_AMBULATORY_CARE_PROVIDER_SITE_OTHER): Payer: PRIVATE HEALTH INSURANCE | Admitting: Internal Medicine

## 2010-09-02 DIAGNOSIS — R1013 Epigastric pain: Secondary | ICD-10-CM

## 2010-09-02 DIAGNOSIS — R109 Unspecified abdominal pain: Secondary | ICD-10-CM

## 2010-09-02 DIAGNOSIS — M546 Pain in thoracic spine: Secondary | ICD-10-CM

## 2010-09-02 LAB — CBC WITH DIFFERENTIAL/PLATELET
Basophils Relative: 0.6 % (ref 0.0–3.0)
Eosinophils Relative: 2.7 % (ref 0.0–5.0)
Hemoglobin: 14.4 g/dL (ref 12.0–15.0)
Lymphocytes Relative: 23.1 % (ref 12.0–46.0)
Monocytes Relative: 6.3 % (ref 3.0–12.0)
Neutrophils Relative %: 67.3 % (ref 43.0–77.0)
RBC: 4.25 Mil/uL (ref 3.87–5.11)
WBC: 6.2 10*3/uL (ref 4.5–10.5)

## 2010-09-02 LAB — POCT URINALYSIS DIPSTICK
Bilirubin, UA: NEGATIVE
Glucose, UA: NEGATIVE
Nitrite, UA: NEGATIVE
Urobilinogen, UA: 0.2

## 2010-09-02 LAB — HEPATIC FUNCTION PANEL
ALT: 46 U/L — ABNORMAL HIGH (ref 0–35)
AST: 25 U/L (ref 0–37)
Alkaline Phosphatase: 71 U/L (ref 39–117)
Bilirubin, Direct: 0.1 mg/dL (ref 0.0–0.3)
Total Protein: 7.2 g/dL (ref 6.0–8.3)

## 2010-09-02 MED ORDER — TRAMADOL HCL 50 MG PO TABS: 50.0000 mg | ORAL_TABLET | Freq: Four times a day (QID) | ORAL | Status: AC | PRN

## 2010-09-02 MED ORDER — RANITIDINE HCL 150 MG PO CAPS: 150.0000 mg | ORAL_CAPSULE | Freq: Two times a day (BID) | ORAL | Status: DC

## 2010-09-02 NOTE — Patient Instructions (Signed)
The triggers for dyspepsia or "heart burn"  include stress; the "aspirin family" ; alcohol; peppermint; and caffeine (coffee, tea, cola, and chocolate). The aspirin family would include aspirin and the nonsteroidal agents such as ibuprofen &  Naproxen. Tylenol would not cause reflux. If having dyspepsia ; food & dink should be avoided for @ least 2 hors before going to bed. Complete stool cards

## 2010-09-02 NOTE — Progress Notes (Signed)
  Subjective:    Patient ID: [name], female    DOB: 1951-01-23, 60 y.o.   MRN: [id number]  HPI  #1 ABDOMINAL PAIN Location: epigastrium  Onset: 08/22/2010   Radiation: ? minimally to back  Severity: up to 4-5 Quality: aching  Duration: 60-90 minutes after b'fast & lunch  Better with: Maalox & Zantac 150 mg Worse with:after  eating  Symptoms Nausea/Vomiting: no  Diarrhea: no  Constipation: no  Melena/BRBPR: no, but 1 BM green 06/05  Hematemesis: no  Anorexia: no  Fever/Chills: no  Dysuria: no  Rash: no  Wt loss: no  EtOH use: no  NSAIDs/ASA: yes, Aleve or Ibuprofen daily  LMP: 03/2010 Vaginal bleeding: no  Past Surgeries: virtual Colonoscopy ; no upper Endo FH:no GI disease #2 BACK PAIN Location: lower neck & upper back  Quality: aching  Onset: 03/12 Worse with: lifting weights  Better with: NSAIDS help some   Radiation: from neck to interscapular area Trauma: no Red Flags Fecal/urinary incontinence: no  Numbness/Weakness: no  No relief with bedrest: yes, but variable   PMH of osteoporosis or chronic steroid use: no, BMD improved         Review of Systems PMH of mildly elevated LFT in 03/12; it resolved on F/U.     Objective:   Physical Exam Gen.: Healthy and well-nourished in appearance. Alert, appropriate and cooperative throughout exam. Head: Normocephalic without obvious abnormalities Eyes: No corneal or conjunctival inflammation noted. Field of Vision grossly normal. No icterus Mouth: Oral mucosa and oropharynx reveal no lesions or exudates. Teeth in good repair. Neck: No deformities, masses, or tenderness noted. Range of motion &. Thyroid normal. Lungs: Normal respiratory effort; chest expands symmetrically. Lungs are clear to auscultation without rales, wheezes, or increased work of breathing. Heart: Slow  rate and regular  rhythm. Normal S1 and S2. No gallop, click, or rub. S4 w/o  murmur. Abdomen: Bowel sounds normal; abdomen soft and  nontender. No masses, organomegaly or hernias noted. Musculoskeletal/extremities: No deformity or scoliosis noted of  the thoracic or lumbar spine. No clubbing, cyanosis, edema, or deformity noted. Range of motion  normal .Tone & strength  Normal.OA/ DJD of fingers. Nail health  Good. She lay back & sat up w/o help. Neg SLR Vascular: Carotid, radial artery, dorsalis pedis and dorsalis posterior tibial pulses are full and equal. No bruits present. Neurologic: Alert and oriented x3. Deep tendon reflexes symmetrical and normal.          Skin: Intact without suspicious lesions or rashes. No jaundice Lymph: No cervical or axillary lymphadenopathy present. Psych: Mood and affect are normal. Normally interactive                                                                                          Assessment & Plan:  #1abd pain #2 neck/back pain w/o neurologic deficit Plan: see orders

## 2010-09-06 ENCOUNTER — Other Ambulatory Visit: Payer: Self-pay | Admitting: Orthopaedic Surgery

## 2010-09-06 DIAGNOSIS — M549 Dorsalgia, unspecified: Secondary | ICD-10-CM

## 2010-09-09 ENCOUNTER — Other Ambulatory Visit: Payer: Self-pay | Admitting: Orthopaedic Surgery

## 2010-09-09 DIAGNOSIS — M549 Dorsalgia, unspecified: Secondary | ICD-10-CM

## 2010-09-12 ENCOUNTER — Other Ambulatory Visit (INDEPENDENT_AMBULATORY_CARE_PROVIDER_SITE_OTHER): Payer: PRIVATE HEALTH INSURANCE

## 2010-09-12 DIAGNOSIS — Z1211 Encounter for screening for malignant neoplasm of colon: Secondary | ICD-10-CM

## 2010-09-12 LAB — HEMOCCULT GUIAC POC 1CARD (OFFICE)
Card #3 Fecal Occult Blood, POC: NEGATIVE
Fecal Occult Blood, POC: NEGATIVE

## 2010-09-12 NOTE — Progress Notes (Signed)
Labs only

## 2010-09-23 ENCOUNTER — Other Ambulatory Visit: Payer: Self-pay | Admitting: Family Medicine

## 2010-12-15 LAB — DIFFERENTIAL
Basophils Absolute: 0
Basophils Relative: 1
Eosinophils Absolute: 0.1
Lymphs Abs: 1.3
Neutrophils Relative %: 65

## 2010-12-15 LAB — CBC
MCHC: 34.4
RBC: 4.42
WBC: 4.9

## 2010-12-15 LAB — COMPREHENSIVE METABOLIC PANEL
ALT: 30
Alkaline Phosphatase: 55
CO2: 31
Calcium: 9.1
Chloride: 105
GFR calc non Af Amer: 56 — ABNORMAL LOW
Glucose, Bld: 96
Sodium: 141
Total Bilirubin: 0.6

## 2010-12-15 LAB — URINALYSIS, ROUTINE W REFLEX MICROSCOPIC
Bilirubin Urine: NEGATIVE
Hgb urine dipstick: NEGATIVE
Ketones, ur: NEGATIVE
Nitrite: NEGATIVE
Protein, ur: NEGATIVE
Urobilinogen, UA: 0.2

## 2010-12-15 LAB — PROTIME-INR
INR: 0.9
Prothrombin Time: 12.1

## 2011-01-02 ENCOUNTER — Other Ambulatory Visit: Payer: Self-pay | Admitting: Internal Medicine

## 2011-01-02 DIAGNOSIS — R1013 Epigastric pain: Secondary | ICD-10-CM

## 2011-01-02 MED ORDER — RANITIDINE HCL 150 MG PO CAPS: 150.0000 mg | ORAL_CAPSULE | Freq: Two times a day (BID) | ORAL | Status: DC

## 2011-01-02 NOTE — Telephone Encounter (Signed)
Pharmacy called and requested rx be changed from cap to tab for cost saving reasons. OK to change per Dr.Hopper

## 2011-01-02 NOTE — Telephone Encounter (Signed)
Addended by: Edgardo Roys on: 01/02/2011 05:21 PM   Modules accepted: Orders

## 2011-01-02 NOTE — Telephone Encounter (Signed)
RX sent

## 2011-05-03 ENCOUNTER — Other Ambulatory Visit: Payer: Self-pay | Admitting: Obstetrics & Gynecology

## 2011-05-03 DIAGNOSIS — Z1231 Encounter for screening mammogram for malignant neoplasm of breast: Secondary | ICD-10-CM

## 2011-05-15 ENCOUNTER — Encounter: Payer: Self-pay | Admitting: Internal Medicine

## 2011-05-15 ENCOUNTER — Ambulatory Visit (INDEPENDENT_AMBULATORY_CARE_PROVIDER_SITE_OTHER): Payer: PRIVATE HEALTH INSURANCE | Admitting: Internal Medicine

## 2011-05-15 VITALS — BP 112/80 | HR 70 | Temp 97.7°F | Resp 12 | Ht 62.75 in | Wt 162.0 lb

## 2011-05-15 DIAGNOSIS — E782 Mixed hyperlipidemia: Secondary | ICD-10-CM

## 2011-05-15 DIAGNOSIS — M542 Cervicalgia: Secondary | ICD-10-CM

## 2011-05-15 DIAGNOSIS — Z0000 Encounter for general adult medical examination without abnormal findings: Secondary | ICD-10-CM

## 2011-05-15 LAB — LIPID PANEL
Cholesterol: 141 mg/dL (ref 0–200)
LDL Cholesterol: 77 mg/dL (ref 0–99)
Total CHOL/HDL Ratio: 3
Triglycerides: 50 mg/dL (ref 0.0–149.0)
VLDL: 10 mg/dL (ref 0.0–40.0)

## 2011-05-15 LAB — CBC WITH DIFFERENTIAL/PLATELET
Basophils Absolute: 0 10*3/uL (ref 0.0–0.1)
Eosinophils Relative: 0.6 % (ref 0.0–5.0)
Monocytes Absolute: 0.3 10*3/uL (ref 0.1–1.0)
Monocytes Relative: 6.3 % (ref 3.0–12.0)
Neutrophils Relative %: 66.3 % (ref 43.0–77.0)
Platelets: 234 10*3/uL (ref 150.0–400.0)
RDW: 12.3 % (ref 11.5–14.6)
WBC: 4.9 10*3/uL (ref 4.5–10.5)

## 2011-05-15 LAB — BASIC METABOLIC PANEL
BUN: 10 mg/dL (ref 6–23)
Creatinine, Ser: 0.9 mg/dL (ref 0.4–1.2)
GFR: 69.96 mL/min (ref >60.00)
Glucose, Bld: 80 mg/dL (ref 70–99)

## 2011-05-15 LAB — HEPATIC FUNCTION PANEL
ALT: 19 U/L (ref 0–35)
AST: 20 U/L (ref 0–37)
Bilirubin, Direct: 0.1 mg/dL (ref 0.0–0.3)
Total Bilirubin: 0.8 mg/dL (ref 0.3–1.2)

## 2011-05-15 NOTE — Patient Instructions (Signed)
Preventive Health Care: Exercise  30-45  minutes a day, 3-4 days a week. Walking is especially valuable in preventing Osteoporosis. Eat a low-fat diet with lots of fruits and vegetables, up to 7-9 servings per day. Consume less than 30 grams of sugar per day from foods & drinks with High Fructose Corn Syrup as # 1,2,3 or #4 on label. Massage therapy is medically indicated to treat the chronic recurrent neck and shoulder pain. This would be safer than taking nonsteroidals on a regular basis.

## 2011-05-15 NOTE — Progress Notes (Signed)
Subjective:    Patient ID: [name], female    DOB: 1950/11/17, 61 y.o.   MRN: [id number]  HPI  Reece Levy  is here for a physical; acute issues include neck & shoulder pain intermittently with decreased  ROM. NSAIDS help occasionally; massage definitely helps.    Review of Systems Patient reports no significant  vision/ hearing  changes, adenopathy,fever,   persistant / recurrent hoarseness , swallowing issues, palpitations,edema,persistant /recurrent cough, hemoptysis, dyspnea( rest/ exertional/paroxysmal nocturnal), gastrointestinal bleeding(melena, rectal bleeding), abdominal pain, significant heartburn,  bowel changes,GU symptoms(dysuria, hematuria,pyuria, incontinence), Gyn symptoms(abnormal  bleeding , pain),  focal weakness, memory loss,numbness & tingling, skin/hair /nail changes,abnormal bruising or bleeding, anxiety,or depression.   She is on weight loss product from her gynecologist and has lost 12 pounds. With decreased humidity in the work environment she's had some epistaxis. She has intermittent chest tightness without anginal character. This resolves simply with relaxation.     Objective:   Physical Exam Gen.: Healthy and well-nourished in appearance. Alert, appropriate and cooperative throughout exam. Head: Normocephalic without obvious abnormalities  Eyes: No corneal or conjunctival inflammation noted. Pupils equal round reactive to light and accommodation. Fundal exam is benign without hemorrhages, exudate, papilledema. Extraocular motion intact. Vision grossly normal. Ears: External  ear exam reveals no significant lesions or deformities. Canals clear .TMs normal. Hearing is grossly normal bilaterally. Nose: External nasal exam reveals no deformity or inflammation. Nasal mucosa are pink and moist. No lesions or exudates noted. Septum dry Mouth: Oral mucosa and oropharynx reveal no lesions or exudates. Teeth in good repair. Neck: No deformities, masses, or tenderness noted.  Range of motion decreased laterally. Thyroid normal Lungs: Normal respiratory effort; chest expands symmetrically. Lungs are clear to auscultation without rales, wheezes, or increased work of breathing. Heart: Normal rate and rhythm. Normal S1 and S2. No gallop, click, or rub. S4 with slurring at  LSB 'no murmur. Abdomen: Bowel sounds normal; abdomen soft and nontender. No masses, organomegaly or hernias noted. Genitalia:Dr Jennette Kettle                Musculoskeletal/extremities: minor lordosis  noted of  the thoracic  spine. No clubbing, cyanosis, edema, or deformity noted. Tone & strength  normal.Joints : OA finger changes. Nail health  good. Vascular: Carotid, radial artery, dorsalis pedis and  posterior tibial pulses are full and equal. No bruits present. Neurologic: Alert and oriented x3. Deep tendon reflexes symmetrical and normal.         Skin: Intact without suspicious lesions or rashes. Lymph: No cervical, axillary lymphadenopathy present. Psych: Mood and affect are normal. Normally interactive                                                                                         Assessment & Plan:  #1 comprehensive physical exam; no acute findings #2 see Problem List with Assessments & Recommendations  #3 neck and shoulder pain; she's had significant consistent response to massage therapy. This would be medically indicated on a regular basis in order to reduce use of nonsteroidals.  #4 atypical chest symptoms in the context of weight loss product. Stress testing indicated if symptoms  persist or progress. Plan: see Orders

## 2011-05-18 ENCOUNTER — Encounter: Payer: Self-pay | Admitting: Internal Medicine

## 2011-05-23 ENCOUNTER — Encounter: Payer: Self-pay | Admitting: Internal Medicine

## 2011-05-23 ENCOUNTER — Other Ambulatory Visit: Payer: Self-pay

## 2011-05-23 MED ORDER — PRAVASTATIN SODIUM 20 MG PO TABS: 20.0000 mg | ORAL_TABLET | Freq: Every day | ORAL | Status: DC

## 2011-05-23 MED ORDER — ZOLPIDEM TARTRATE 5 MG PO TABS: 5.0000 mg | ORAL_TABLET | Freq: Every evening | ORAL | Status: DC | PRN

## 2011-05-23 MED ORDER — SPIRONOLACTONE 25 MG PO TABS: 25.0000 mg | ORAL_TABLET | Freq: Two times a day (BID) | ORAL | Status: DC

## 2011-05-23 NOTE — Telephone Encounter (Signed)
Prescriptions sent to pharmacy and patient notified.

## 2011-05-25 ENCOUNTER — Telehealth: Payer: Self-pay | Admitting: *Deleted

## 2011-05-25 MED ORDER — SPIRONOLACTONE 25 MG PO TABS: 25.0000 mg | ORAL_TABLET | Freq: Two times a day (BID) | ORAL | Status: DC

## 2011-05-25 MED ORDER — PRAVASTATIN SODIUM 20 MG PO TABS: 20.0000 mg | ORAL_TABLET | Freq: Every day | ORAL | Status: DC

## 2011-05-25 NOTE — Telephone Encounter (Signed)
rx sent to pharmacy by e-script For #90 per noted last CPE on 05-15-11

## 2011-05-25 NOTE — Telephone Encounter (Signed)
Noted MY chart email request for 90 day supply of pravastatin and aldactone, noted last CPE 05-15-11 sent 90 day supply in absence of MD Alwyn Ren via MD Beverely Low

## 2011-06-12 ENCOUNTER — Ambulatory Visit
Admission: RE | Admit: 2011-06-12 | Discharge: 2011-06-12 | Disposition: A | Discharge status: Home / Self Care | Payer: PRIVATE HEALTH INSURANCE | Source: Ambulatory Visit | Attending: Obstetrics & Gynecology | Admitting: Obstetrics & Gynecology

## 2011-06-12 DIAGNOSIS — Z1231 Encounter for screening mammogram for malignant neoplasm of breast: Secondary | ICD-10-CM

## 2011-08-15 ENCOUNTER — Other Ambulatory Visit: Payer: Self-pay | Admitting: Internal Medicine

## 2011-10-25 ENCOUNTER — Encounter: Payer: Self-pay | Admitting: Internal Medicine

## 2011-10-30 ENCOUNTER — Encounter: Payer: Self-pay | Admitting: Internal Medicine

## 2012-04-29 ENCOUNTER — Other Ambulatory Visit: Payer: Self-pay | Admitting: Internal Medicine

## 2012-05-04 ENCOUNTER — Other Ambulatory Visit: Payer: Self-pay

## 2012-05-14 ENCOUNTER — Other Ambulatory Visit: Payer: Self-pay | Admitting: Obstetrics & Gynecology

## 2012-05-14 DIAGNOSIS — Z1231 Encounter for screening mammogram for malignant neoplasm of breast: Secondary | ICD-10-CM

## 2012-05-15 ENCOUNTER — Encounter: Payer: Self-pay | Admitting: Lab

## 2012-05-16 ENCOUNTER — Ambulatory Visit (INDEPENDENT_AMBULATORY_CARE_PROVIDER_SITE_OTHER): Payer: PRIVATE HEALTH INSURANCE | Admitting: Internal Medicine

## 2012-05-16 ENCOUNTER — Encounter: Payer: Self-pay | Admitting: Internal Medicine

## 2012-05-16 VITALS — BP 122/80 | HR 62 | Temp 98.0°F | Resp 12 | Ht 62.08 in | Wt 154.0 lb

## 2012-05-16 DIAGNOSIS — Z0000 Encounter for general adult medical examination without abnormal findings: Secondary | ICD-10-CM

## 2012-05-16 LAB — CBC WITH DIFFERENTIAL/PLATELET
Basophils Absolute: 0.1 10*3/uL (ref 0.0–0.1)
Eosinophils Absolute: 0.1 10*3/uL (ref 0.0–0.7)
Hemoglobin: 14.6 g/dL (ref 12.0–15.0)
Lymphocytes Relative: 23.6 % (ref 12.0–46.0)
MCHC: 34.3 g/dL (ref 30.0–36.0)
Monocytes Relative: 6.3 % (ref 3.0–12.0)
Neutro Abs: 3.7 10*3/uL (ref 1.4–7.7)
Neutrophils Relative %: 67.5 % (ref 43.0–77.0)
Platelets: 250 10*3/uL (ref 150.0–400.0)
RDW: 12.2 % (ref 11.5–14.6)

## 2012-05-16 LAB — TSH: TSH: 1.02 u[IU]/mL (ref 0.35–5.50)

## 2012-05-16 LAB — HEPATIC FUNCTION PANEL
AST: 25 U/L (ref 0–37)
Albumin: 4.1 g/dL (ref 3.5–5.2)
Alkaline Phosphatase: 55 U/L (ref 39–117)
Bilirubin, Direct: 0.1 mg/dL (ref 0.0–0.3)
Total Bilirubin: 0.8 mg/dL (ref 0.3–1.2)

## 2012-05-16 LAB — BASIC METABOLIC PANEL
BUN: 16 mg/dL (ref 6–23)
CO2: 26 mEq/L (ref 19–32)
Calcium: 9.1 mg/dL (ref 8.4–10.5)
Creatinine, Ser: 0.9 mg/dL (ref 0.4–1.2)
GFR: 64.61 mL/min (ref >60.00)
Glucose, Bld: 88 mg/dL (ref 70–99)
Sodium: 136 mEq/L (ref 135–145)

## 2012-05-16 LAB — LIPID PANEL
HDL: 56.2 mg/dL (ref >39.00)
LDL Cholesterol: 91 mg/dL (ref 0–99)
VLDL: 13.6 mg/dL (ref 0.0–40.0)

## 2012-05-16 MED ORDER — SPIRONOLACTONE 25 MG PO TABS: 25.0000 mg | ORAL_TABLET | Freq: Two times a day (BID) | ORAL | Status: DC

## 2012-05-16 MED ORDER — PRAVASTATIN SODIUM 20 MG PO TABS: 20.0000 mg | ORAL_TABLET | Freq: Every day | ORAL | Status: DC

## 2012-05-16 NOTE — Patient Instructions (Addendum)
Preventive Health Care: Exercise  30-45  minutes a day, 3-4 days a week. Walking is especially valuable in preventing Osteoporosis. Eat a low-fat diet with lots of fruits and vegetables, up to 7-9 servings per day. This would eliminate need for vitamin supplements for most individuals. Consume less than 30 grams of sugar per day from foods & drinks with High Fructose Corn Syrup as #2,3 or #4 on label. Minimal Blood Pressure Goal= AVERAGE < 140/90;  Ideal is an AVERAGE < 135/85. This AVERAGE should be calculated from @ least 5-7 BP readings taken @ different times of day on different days of week. You should not respond to isolated BP readings , but rather the AVERAGE for that week .

## 2012-05-16 NOTE — Progress Notes (Signed)
  Subjective:    Patient ID: [name], female    DOB: 19-Apr-1950, 62 y.o.   MRN: [id number]  HPI [name] is here for a physical; she denies acute issues .     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 Specifically she denies chest pain, palpitations, dyspnea, or claudication. Family history is negative for premature coronary disease . Advanced cholesterol testing reveals her LDL goal is less than 130. There is medication compliance with the statin. Significant abdominal symptoms, memory deficit, or myalgias denied. .     Objective:   Physical Exam Gen.: Healthy and well-nourished in appearance. Alert, appropriate and cooperative throughout exam.Appears younger than stated age  Head: Normocephalic without obvious abnormalities  Eyes: No corneal or conjunctival inflammation noted. Pupils equal round reactive to light and accommodation.  Extraocular motion intact. Vision grossly normal with lenses Ears: External  ear exam reveals no significant lesions or deformities. Canals clear .TMs normal. Hearing is grossly normal bilaterally. Nose: External nasal exam reveals no deformity or inflammation. Nasal mucosa are pink and moist. No lesions or exudates noted. Mouth: Oral mucosa and oropharynx reveal no lesions or exudates. Teeth in good repair. Neck: No deformities, masses, or tenderness noted. Range of motion & Thyroid normal. Lungs: Normal respiratory effort; chest expands symmetrically. Lungs are clear to auscultation without rales, wheezes, or increased work of breathing. Heart: Normal rate and rhythm. Normal S1 and S2. No gallop, click, or rub. S4 w/o significant murmur. Abdomen: Bowel sounds normal; abdomen soft and nontender. No masses, organomegaly or hernias noted. Genitalia: As per Dr Gray Bernhardt                                  Musculoskeletal/extremities: No deformity or scoliosis noted of   the thoracic or lumbar spine.  No clubbing, cyanosis, or edema noted. Range of motion normal .Tone & strength  Normal. Joints  reveal mild  DJD DIP changes. Nail health good. Able to lie down & sit up w/o help. Negative SLR bilaterally Vascular: Carotid, radial artery, dorsalis pedis and  posterior tibial pulses are  equal. No bruits present.Radial & DPP are decreased Neurologic: Alert and oriented x3. Deep tendon reflexes symmetrical and normal.  Skin: Intact without suspicious lesions or rashes. Lymph: No cervical, axillary lymphadenopathy present. Psych: Mood and affect are normal. Normally interactive                                                                                        Assessment & Plan:  #1 comprehensive physical exam; no acute findings  Plan: see Orders  & Recommendations

## 2012-05-22 LAB — VITAMIN D 1,25 DIHYDROXY: Vitamin D 1, 25 (OH)2 Total: 54 pg/mL (ref 18–72)

## 2012-06-13 ENCOUNTER — Ambulatory Visit
Admission: RE | Admit: 2012-06-13 | Discharge: 2012-06-13 | Disposition: A | Discharge status: Home / Self Care | Payer: PRIVATE HEALTH INSURANCE | Source: Ambulatory Visit | Attending: Obstetrics & Gynecology | Admitting: Obstetrics & Gynecology

## 2012-06-13 DIAGNOSIS — Z1231 Encounter for screening mammogram for malignant neoplasm of breast: Secondary | ICD-10-CM

## 2012-06-25 ENCOUNTER — Other Ambulatory Visit: Payer: Self-pay | Admitting: Internal Medicine

## 2012-06-25 ENCOUNTER — Encounter: Payer: Self-pay | Admitting: Internal Medicine

## 2012-06-25 DIAGNOSIS — M5137 Other intervertebral disc degeneration, lumbosacral region: Secondary | ICD-10-CM

## 2012-06-25 DIAGNOSIS — M533 Sacrococcygeal disorders, not elsewhere classified: Secondary | ICD-10-CM

## 2012-06-26 ENCOUNTER — Encounter: Payer: Self-pay | Admitting: Internal Medicine

## 2012-06-26 ENCOUNTER — Other Ambulatory Visit: Payer: Self-pay | Admitting: Internal Medicine

## 2012-06-27 ENCOUNTER — Ambulatory Visit
Admission: RE | Admit: 2012-06-27 | Discharge: 2012-06-27 | Disposition: A | Discharge status: Home / Self Care | Payer: No Typology Code available for payment source | Source: Ambulatory Visit | Attending: Internal Medicine | Admitting: Internal Medicine

## 2012-06-27 ENCOUNTER — Other Ambulatory Visit: Payer: Self-pay | Admitting: Internal Medicine

## 2012-06-27 DIAGNOSIS — M461 Sacroiliitis, not elsewhere classified: Secondary | ICD-10-CM

## 2012-07-01 ENCOUNTER — Ambulatory Visit
Admission: RE | Admit: 2012-07-01 | Discharge: 2012-07-01 | Disposition: A | Discharge status: Home / Self Care | Payer: Self-pay | Source: Ambulatory Visit | Attending: Internal Medicine | Admitting: Internal Medicine

## 2012-07-01 DIAGNOSIS — M533 Sacrococcygeal disorders, not elsewhere classified: Secondary | ICD-10-CM

## 2012-07-01 DIAGNOSIS — M5137 Other intervertebral disc degeneration, lumbosacral region: Secondary | ICD-10-CM

## 2012-07-01 MED ORDER — METHYLPREDNISOLONE ACETATE 40 MG/ML INJ SUSP (RADIOLOG: 120.0000 mg | Freq: Once | INTRAMUSCULAR | Status: DC

## 2012-07-01 MED ORDER — IOHEXOL 180 MG/ML  SOLN: 1.0000 mL | Freq: Once | INTRAMUSCULAR | Status: AC | PRN

## 2012-07-12 ENCOUNTER — Encounter: Payer: Self-pay | Admitting: Internal Medicine

## 2012-09-25 ENCOUNTER — Other Ambulatory Visit: Payer: Self-pay | Admitting: Otolaryngology

## 2012-09-25 DIAGNOSIS — J3489 Other specified disorders of nose and nasal sinuses: Secondary | ICD-10-CM

## 2012-09-26 ENCOUNTER — Ambulatory Visit
Admission: RE | Admit: 2012-09-26 | Discharge: 2012-09-26 | Disposition: A | Discharge status: Home / Self Care | Payer: No Typology Code available for payment source | Source: Ambulatory Visit | Attending: Otolaryngology | Admitting: Otolaryngology

## 2012-09-26 DIAGNOSIS — J3489 Other specified disorders of nose and nasal sinuses: Secondary | ICD-10-CM

## 2012-10-09 ENCOUNTER — Encounter: Payer: Self-pay | Admitting: Internal Medicine

## 2012-10-10 ENCOUNTER — Other Ambulatory Visit: Payer: Self-pay | Admitting: Internal Medicine

## 2012-10-10 DIAGNOSIS — M545 Low back pain, unspecified: Secondary | ICD-10-CM

## 2012-10-11 ENCOUNTER — Other Ambulatory Visit: Payer: Self-pay | Admitting: Family Medicine

## 2012-10-11 DIAGNOSIS — M549 Dorsalgia, unspecified: Secondary | ICD-10-CM

## 2012-10-14 ENCOUNTER — Ambulatory Visit
Admission: RE | Admit: 2012-10-14 | Discharge: 2012-10-14 | Disposition: A | Discharge status: Home / Self Care | Payer: No Typology Code available for payment source | Source: Ambulatory Visit | Attending: Family Medicine | Admitting: Family Medicine

## 2012-10-14 DIAGNOSIS — M549 Dorsalgia, unspecified: Secondary | ICD-10-CM

## 2012-10-14 MED ORDER — METHYLPREDNISOLONE ACETATE 40 MG/ML INJ SUSP (RADIOLOG
120.0000 mg | Freq: Once | INTRAMUSCULAR | Status: AC
  Administered 2012-10-14: 120 mg via EPIDURAL

## 2012-10-14 MED ORDER — IOHEXOL 180 MG/ML  SOLN
1.0000 mL | Freq: Once | INTRAMUSCULAR | Status: AC | PRN
  Administered 2012-10-14: 1 mL via INTRA_ARTICULAR

## 2012-12-27 ENCOUNTER — Other Ambulatory Visit: Payer: Self-pay | Admitting: Internal Medicine

## 2012-12-27 NOTE — Telephone Encounter (Signed)
OK # 30 q 3rd night prn

## 2012-12-27 NOTE — Telephone Encounter (Signed)
Ambien Last OV: 05/16/2012 Last refill: 05/23/2011 UDS due    Low risk

## 2012-12-27 NOTE — Telephone Encounter (Signed)
Ambien Last OV: 05/16/2012 Last refill: 06/12/2011 UDS due Low risk

## 2012-12-27 NOTE — Telephone Encounter (Signed)
Ambien refilled

## 2013-01-23 ENCOUNTER — Other Ambulatory Visit: Payer: Self-pay

## 2013-01-28 ENCOUNTER — Encounter: Payer: Self-pay | Admitting: Internal Medicine

## 2013-02-24 ENCOUNTER — Other Ambulatory Visit: Payer: Self-pay | Admitting: Obstetrics & Gynecology

## 2013-02-24 ENCOUNTER — Ambulatory Visit
Admission: RE | Admit: 2013-02-24 | Discharge: 2013-02-24 | Disposition: A | Discharge status: Home / Self Care | Payer: No Typology Code available for payment source | Source: Ambulatory Visit | Attending: Obstetrics & Gynecology | Admitting: Obstetrics & Gynecology

## 2013-02-24 DIAGNOSIS — N938 Other specified abnormal uterine and vaginal bleeding: Secondary | ICD-10-CM

## 2013-03-12 ENCOUNTER — Encounter (HOSPITAL_BASED_OUTPATIENT_CLINIC_OR_DEPARTMENT_OTHER): Payer: Self-pay | Admitting: *Deleted

## 2013-03-12 NOTE — Progress Notes (Signed)
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

## 2013-03-17 ENCOUNTER — Other Ambulatory Visit: Payer: Self-pay | Admitting: Obstetrics & Gynecology

## 2013-03-17 DIAGNOSIS — R9389 Abnormal findings on diagnostic imaging of other specified body structures: Secondary | ICD-10-CM

## 2013-03-19 ENCOUNTER — Ambulatory Visit
Admission: RE | Admit: 2013-03-19 | Discharge: 2013-03-19 | Disposition: A | Discharge status: Home / Self Care | Payer: No Typology Code available for payment source | Source: Ambulatory Visit | Attending: Obstetrics & Gynecology | Admitting: Obstetrics & Gynecology

## 2013-03-19 DIAGNOSIS — R9389 Abnormal findings on diagnostic imaging of other specified body structures: Secondary | ICD-10-CM

## 2013-03-21 ENCOUNTER — Ambulatory Visit (HOSPITAL_BASED_OUTPATIENT_CLINIC_OR_DEPARTMENT_OTHER)
Admission: RE | Admit: 2013-03-21 | Discharge: 2013-03-21 | Disposition: A | Discharge status: Home / Self Care | Payer: No Typology Code available for payment source | Source: Ambulatory Visit | Attending: Otolaryngology | Admitting: Otolaryngology

## 2013-03-21 LAB — BASIC METABOLIC PANEL
BUN: 12 mg/dL (ref 6–23)
CALCIUM: 9.2 mg/dL (ref 8.4–10.5)
CO2: 27 mEq/L (ref 19–32)
Chloride: 100 mEq/L (ref 96–112)
Creatinine, Ser: 0.82 mg/dL (ref 0.50–1.10)
GFR calc Af Amer: 88 mL/min — ABNORMAL LOW (ref >90)
GFR, EST NON AFRICAN AMERICAN: 76 mL/min — AB (ref >90)
Glucose, Bld: 89 mg/dL (ref 70–99)
Potassium: 4.3 mEq/L (ref 3.7–5.3)
SODIUM: 141 meq/L (ref 137–147)

## 2013-03-24 ENCOUNTER — Other Ambulatory Visit: Payer: Self-pay

## 2013-03-24 ENCOUNTER — Ambulatory Visit (HOSPITAL_BASED_OUTPATIENT_CLINIC_OR_DEPARTMENT_OTHER): Payer: PRIVATE HEALTH INSURANCE | Admitting: Anesthesiology

## 2013-03-24 ENCOUNTER — Encounter (HOSPITAL_BASED_OUTPATIENT_CLINIC_OR_DEPARTMENT_OTHER): Payer: PRIVATE HEALTH INSURANCE | Admitting: Anesthesiology

## 2013-03-24 ENCOUNTER — Encounter (HOSPITAL_BASED_OUTPATIENT_CLINIC_OR_DEPARTMENT_OTHER)
Admission: RE | Disposition: A | Discharge status: Home / Self Care | Payer: Self-pay | Source: Ambulatory Visit | Attending: Otolaryngology

## 2013-03-24 ENCOUNTER — Ambulatory Visit (HOSPITAL_BASED_OUTPATIENT_CLINIC_OR_DEPARTMENT_OTHER)
Admission: RE | Admit: 2013-03-24 | Discharge: 2013-03-24 | Disposition: A | Discharge status: Home / Self Care | Payer: PRIVATE HEALTH INSURANCE | Source: Ambulatory Visit | Attending: Otolaryngology | Admitting: Otolaryngology

## 2013-03-24 ENCOUNTER — Encounter (HOSPITAL_BASED_OUTPATIENT_CLINIC_OR_DEPARTMENT_OTHER): Payer: Self-pay | Admitting: *Deleted

## 2013-03-24 DIAGNOSIS — K219 Gastro-esophageal reflux disease without esophagitis: Secondary | ICD-10-CM | POA: Insufficient documentation

## 2013-03-24 DIAGNOSIS — I10 Essential (primary) hypertension: Secondary | ICD-10-CM | POA: Insufficient documentation

## 2013-03-24 DIAGNOSIS — E785 Hyperlipidemia, unspecified: Secondary | ICD-10-CM | POA: Insufficient documentation

## 2013-03-24 DIAGNOSIS — E559 Vitamin D deficiency, unspecified: Secondary | ICD-10-CM | POA: Insufficient documentation

## 2013-03-24 DIAGNOSIS — J329 Chronic sinusitis, unspecified: Secondary | ICD-10-CM | POA: Insufficient documentation

## 2013-03-24 DIAGNOSIS — J3489 Other specified disorders of nose and nasal sinuses: Secondary | ICD-10-CM | POA: Insufficient documentation

## 2013-03-24 DIAGNOSIS — J349 Unspecified disorder of nose and nasal sinuses: Secondary | ICD-10-CM

## 2013-03-24 DIAGNOSIS — E876 Hypokalemia: Secondary | ICD-10-CM | POA: Insufficient documentation

## 2013-03-24 HISTORY — PX: ENDOSCOPIC CONCHA BULLOSA RESECTION: SHX6395

## 2013-03-24 HISTORY — DX: Gastro-esophageal reflux disease without esophagitis: K21.9

## 2013-03-24 HISTORY — PX: ETHMOIDECTOMY: SHX5197

## 2013-03-24 LAB — POCT HEMOGLOBIN-HEMACUE: HEMOGLOBIN: 12.7 g/dL (ref 12.0–15.0)

## 2013-03-24 SURGERY — EXCISION, CONCHA BULLOSA, ENDOSCOPIC
Anesthesia: General | Laterality: Left

## 2013-03-24 SURGERY — EXCISION, CONCHA BULLOSA, ENDOSCOPIC
Anesthesia: General | Site: Nose | Laterality: Left

## 2013-03-24 MED ORDER — OXYMETAZOLINE HCL 0.05 % NA SOLN
NASAL | Status: DC | PRN
  Administered 2013-03-24: 1 via NASAL

## 2013-03-24 MED ORDER — HYDROMORPHONE HCL PF 1 MG/ML IJ SOLN
0.2500 mg | INTRAMUSCULAR | Status: DC | PRN
  Administered 2013-03-24 (×2): 0.25 mg via INTRAVENOUS
  Administered 2013-03-24: 0.5 mg via INTRAVENOUS

## 2013-03-24 MED ORDER — LIDOCAINE HCL (CARDIAC) 20 MG/ML IV SOLN
INTRAVENOUS | Status: DC | PRN
  Administered 2013-03-24: 60 mg via INTRAVENOUS

## 2013-03-24 MED ORDER — CEPHALEXIN 500 MG PO CAPS: 500.0000 mg | ORAL_CAPSULE | Freq: Three times a day (TID) | ORAL | Status: DC

## 2013-03-24 MED ORDER — OXYCODONE HCL 5 MG PO TABS
ORAL_TABLET | ORAL | Status: AC
  Filled 2013-03-24: qty 1

## 2013-03-24 MED ORDER — PROMETHAZINE HCL 25 MG RE SUPP: 25.0000 mg | Freq: Four times a day (QID) | RECTAL | Status: DC | PRN

## 2013-03-24 MED ORDER — MIDAZOLAM HCL 2 MG/2ML IJ SOLN
INTRAMUSCULAR | Status: AC
Start: 2013-03-24 — End: 2013-03-24
  Filled 2013-03-24: qty 2

## 2013-03-24 MED ORDER — MIDAZOLAM HCL 5 MG/5ML IJ SOLN
INTRAMUSCULAR | Status: DC | PRN
  Administered 2013-03-24: 2 mg via INTRAVENOUS

## 2013-03-24 MED ORDER — SUCCINYLCHOLINE CHLORIDE 20 MG/ML IJ SOLN
INTRAMUSCULAR | Status: DC | PRN
  Administered 2013-03-24: 100 mg via INTRAVENOUS

## 2013-03-24 MED ORDER — BACIT-POLY-NEO HC 1 % EX OINT
TOPICAL_OINTMENT | CUTANEOUS | Status: AC
  Filled 2013-03-24: qty 30

## 2013-03-24 MED ORDER — OXYCODONE HCL 5 MG PO TABS
5.0000 mg | ORAL_TABLET | Freq: Once | ORAL | Status: AC | PRN
  Administered 2013-03-24: 5 mg via ORAL

## 2013-03-24 MED ORDER — PROPOFOL 10 MG/ML IV BOLUS
INTRAVENOUS | Status: DC | PRN
  Administered 2013-03-24: 150 mg via INTRAVENOUS

## 2013-03-24 MED ORDER — LIDOCAINE-EPINEPHRINE 1 %-1:100000 IJ SOLN
INTRAMUSCULAR | Status: DC | PRN
  Administered 2013-03-24: 4 mL

## 2013-03-24 MED ORDER — ONDANSETRON HCL 4 MG/2ML IJ SOLN
INTRAMUSCULAR | Status: DC | PRN
  Administered 2013-03-24: 4 mg via INTRAVENOUS

## 2013-03-24 MED ORDER — LACTATED RINGERS IV SOLN
INTRAVENOUS | Status: DC
  Administered 2013-03-24 (×2): via INTRAVENOUS

## 2013-03-24 MED ORDER — OXYMETAZOLINE HCL 0.05 % NA SOLN
NASAL | Status: AC
  Filled 2013-03-24: qty 15

## 2013-03-24 MED ORDER — PROPOFOL 10 MG/ML IV BOLUS
INTRAVENOUS | Status: AC
  Filled 2013-03-24: qty 20

## 2013-03-24 MED ORDER — FENTANYL CITRATE 0.05 MG/ML IJ SOLN
INTRAMUSCULAR | Status: DC | PRN
  Administered 2013-03-24: 100 ug via INTRAVENOUS

## 2013-03-24 MED ORDER — FENTANYL CITRATE 0.05 MG/ML IJ SOLN: 50.0000 ug | INTRAMUSCULAR | Status: DC | PRN

## 2013-03-24 MED ORDER — DEXAMETHASONE SODIUM PHOSPHATE 4 MG/ML IJ SOLN
INTRAMUSCULAR | Status: DC | PRN
  Administered 2013-03-24: 10 mg via INTRAVENOUS

## 2013-03-24 MED ORDER — MIDAZOLAM HCL 2 MG/2ML IJ SOLN: 1.0000 mg | INTRAMUSCULAR | Status: DC | PRN

## 2013-03-24 MED ORDER — HYDROMORPHONE HCL PF 1 MG/ML IJ SOLN
INTRAMUSCULAR | Status: AC
  Filled 2013-03-24: qty 1

## 2013-03-24 MED ORDER — LIDOCAINE-EPINEPHRINE 1 %-1:100000 IJ SOLN
INTRAMUSCULAR | Status: AC
  Filled 2013-03-24: qty 1

## 2013-03-24 MED ORDER — FENTANYL CITRATE 0.05 MG/ML IJ SOLN
INTRAMUSCULAR | Status: AC
  Filled 2013-03-24: qty 4

## 2013-03-24 MED ORDER — OXYCODONE HCL 5 MG/5ML PO SOLN: 5.0000 mg | Freq: Once | ORAL | Status: AC | PRN

## 2013-03-24 MED ORDER — OXYMETAZOLINE HCL 0.05 % NA SOLN: 2.0000 | NASAL | Status: DC

## 2013-03-24 MED ORDER — NEOMYCIN-POLYMYXIN-HC 3.5-10000-1 OT SUSP
OTIC | Status: AC
  Filled 2013-03-24: qty 20

## 2013-03-24 MED ORDER — HYDROCODONE-ACETAMINOPHEN 7.5-325 MG PO TABS: 1.0000 | ORAL_TABLET | Freq: Four times a day (QID) | ORAL | Status: DC | PRN

## 2013-03-24 MED ORDER — CEFAZOLIN SODIUM 1-5 GM-% IV SOLN
1.0000 g | INTRAVENOUS | Status: AC
  Administered 2013-03-24: 2 g via INTRAVENOUS

## 2013-03-24 MED ORDER — CEFAZOLIN SODIUM 1-5 GM-% IV SOLN
INTRAVENOUS | Status: AC
  Filled 2013-03-24: qty 100

## 2013-03-24 SURGICAL SUPPLY — 37 items
ATTRACTOMAT 16X20 MAGNETIC DRP (DRAPES) ×2 IMPLANT
BLADE RAD40 ROTATE 4M 4 5PK (BLADE) IMPLANT
BLADE RAD60 ROTATE M4 4 5PK (BLADE) IMPLANT
BLADE ROTATE RAD12 5PK M4 4MM (BLADE) IMPLANT
BLADE TRICUT ROTATE M4 4 5PK (BLADE) IMPLANT
BUR HS RAD FRONTAL 3 (BURR) IMPLANT
CANISTER SUC SOCK COL 7 IN (MISCELLANEOUS) ×2 IMPLANT
CANISTER SUCT 1200ML W/VALVE (MISCELLANEOUS) ×4 IMPLANT
CORDS BIPOLAR (ELECTRODE) IMPLANT
DECANTER SPIKE VIAL GLASS SM (MISCELLANEOUS) IMPLANT
DRESSING ADAPTIC 1/2  N-ADH (PACKING) IMPLANT
DRESSING NASAL KENNEDY 3.5X.9 (MISCELLANEOUS) IMPLANT
DRSG NASAL KENNEDY 3.5X.9 (MISCELLANEOUS)
DRSG NASAL KENNEDY LMNT 8CM (GAUZE/BANDAGES/DRESSINGS) IMPLANT
DRSG NASOPORE 8CM (GAUZE/BANDAGES/DRESSINGS) IMPLANT
DRSG TELFA 3X8 NADH (GAUZE/BANDAGES/DRESSINGS) ×2 IMPLANT
GAUZE SPONGE 4X4 16PLY XRAY LF (GAUZE/BANDAGES/DRESSINGS) IMPLANT
GAUZE VASELINE FOILPK 1/2 X 72 (GAUZE/BANDAGES/DRESSINGS) IMPLANT
GLOVE ECLIPSE 7.5 STRL STRAW (GLOVE) ×2 IMPLANT
GOWN STRL REUS W/ TWL LRG LVL3 (GOWN DISPOSABLE) ×1 IMPLANT
GOWN STRL REUS W/TWL LRG LVL3 (GOWN DISPOSABLE) ×1
HEMOSTAT SURGICEL .5X2 ABSORB (HEMOSTASIS) IMPLANT
IV NS 500ML (IV SOLUTION) ×1
IV NS 500ML BAXH (IV SOLUTION) ×1 IMPLANT
NEEDLE 27GAX1X1/2 (NEEDLE) ×2 IMPLANT
NEEDLE SPNL 25GX3.5 QUINCKE BL (NEEDLE) IMPLANT
NS IRRIG 1000ML POUR BTL (IV SOLUTION) ×2 IMPLANT
PACK BASIN DAY SURGERY FS (CUSTOM PROCEDURE TRAY) ×2 IMPLANT
PACK ENT DAY SURGERY (CUSTOM PROCEDURE TRAY) ×2 IMPLANT
PATTIES SURGICAL .5 X3 (DISPOSABLE) ×2 IMPLANT
SLEEVE SCD COMPRESS KNEE MED (MISCELLANEOUS) IMPLANT
SOLUTION BUTLER CLEAR DIP (MISCELLANEOUS) ×2 IMPLANT
SPONGE GAUZE 2X2 8PLY STRL LF (GAUZE/BANDAGES/DRESSINGS) ×2 IMPLANT
SPONGE SURGIFOAM ABS GEL 12-7 (HEMOSTASIS) IMPLANT
TOWEL OR 17X24 6PK STRL BLUE (TOWEL DISPOSABLE) ×4 IMPLANT
TUBE CONNECTING 20X1/4 (TUBING) ×2 IMPLANT
YANKAUER SUCT BULB TIP NO VENT (SUCTIONS) ×2 IMPLANT

## 2013-03-24 NOTE — Anesthesia Postprocedure Evaluation (Signed)
  Anesthesia Post-op Note  Patient: [name]  Procedure(s) Performed: Procedure(s): ENDOSCOPIC CONCHA BULLOSA RESECTION (Left) ETHMOIDECTOMY (Left)  Patient Location: PACU  Anesthesia Type:General  Level of Consciousness: awake and alert   Airway and Oxygen Therapy: Patient Spontanous Breathing  Post-op Pain: mild  Post-op Assessment: Post-op Vital signs reviewed, Patient's Cardiovascular Status Stable and Respiratory Function Stable  Post-op Vital Signs: Reviewed  Filed Vitals:   03/24/13 0915  BP: 151/89  Pulse: 70  Temp:   Resp: 13    Complications: No apparent anesthesia complications

## 2013-03-24 NOTE — H&P (Signed)
[name] is an 63 y.o. female.   Chief Complaint: Chronic sinusitis HPI: Chronic and recurring sinus infections.  Past Medical History  Diagnosis Date  . Hyperlipidemia     NMR: LDL 120 (1267/584), HDL 59, TG 60. LDL goall<130, ideally <100  . Hypertension   . Abnormal vaginal Pap smear     monitored annually by Dr. Dory Horn  . Hypokalemia     on HCTZ/Triam  . Degenerative joint disease   . Vitamin D deficiency     2000 IU  . GERD (gastroesophageal reflux disease)     Past Surgical History  Procedure Laterality Date  . Tubal ligation    . Toe surgery      for bone spur  . Hysteroscopy      w/ D and C in 09/2005,02/2007,03/2008 (+ poly removal); IDU insertion 09/2008, Dr.Neal  . Neuroma surgery      left foot ,Dr.Sikora  . Bunionectomy  03/2009  . Hammer toe surgery    . Neurectomy foot  03/2008    left  . Colonoscopy  2008    virtual colonoscopy  . Cholecystectomy  09/2007    Lap, Dr.Gerkin     Family History  Problem Relation Age of Onset  . Heart attack Father 9    CABG 4-vessel  . Diabetes Father     late onset  . Lung cancer Mother     former smoker  . Diabetes Mother     late onset  . Obesity Sister   . Stroke Paternal Grandfather 72  . Lung cancer Maternal Grandfather     former smoker  . Breast cancer Maternal Aunt   . Heart attack Paternal Grandmother 45  . Diabetes type I      grand daughter   Social History:  reports that she has never smoked. She does not have any smokeless tobacco history on file. She reports that she drinks alcohol. She reports that she does not use illicit drugs.  Allergies:  Allergies  Allergen Reactions  . Hydrochlorothiazide W-Triamterene Other (See Comments)    hypokalemia  . Povidone Itching    Intolerance, if exposed over a long period.  Ok if used and washed off quickly.  . Tetracycline Rash    Medications Prior to Admission  Medication Sig Dispense Refill  . b complex vitamins tablet Take 1 tablet by  mouth daily.        . Calcium Carbonate-Vitamin D (CALTRATE 600+D PO) Take by mouth.        . Cholecalciferol (VITAMIN D3) 1000 UNITS tablet Take 1,000 Units by mouth daily.        Marland Kitchen estradiol (MINIVELLE) 0.1 MG/24HR Place 1 patch onto the skin 2 (two) times a week.      . famotidine (PEPCID AC) 10 MG chewable tablet Chew 10 mg by mouth as needed.        . fluticasone (FLONASE) 50 MCG/ACT nasal spray USE ONE SPRAY IN EACH NOSTRIL TWICE DAILY AS NEEDED  16 g  5  . Omega-3 Fatty Acids (FISH OIL) 1000 MG CAPS Take by mouth.        . pravastatin (PRAVACHOL) 20 MG tablet Take 1 tablet (20 mg total) by mouth daily.  90 tablet  3  . spironolactone (ALDACTONE) 25 MG tablet Take 1 tablet (25 mg total) by mouth 2 (two) times daily.  180 tablet  3  . zolpidem (AMBIEN) 5 MG tablet TAKE ONE TABLET BY MOUTH AT BEDTIME AS NEEDED FOR  SLEEP (1 EVERY 3RD NIGHT AS NEEDED)  30 tablet  0    No results found for this or any previous visit (from the past 48 hour(s)). No results found.  ROS: otherwise negative  Blood pressure 124/83, pulse 62, temperature 97.7 F (36.5 C), temperature source Oral, resp. rate 18, height 5\' 2"  (1.575 m), weight 160 lb 3.2 oz (72.666 kg), SpO2 99.00%.  PHYSICAL EXAM: Overall appearance:  Healthy appearing, in no distress Head:  Normocephalic, atraumatic. Ears: External auditory canals are clear; tympanic membranes are intact and the middle ears are free of any effusion. Nose: External nose is healthy in appearance. Internal nasal exam reveals enlarged middle turbinate. Oral Cavity/pharynx:  There are no mucosal lesions or masses identified. Hypopharynx/Larynx: no signs of any mucosal lesions or masses identified. Vocal cords move normally. Neuro:  No identifiable neurologic deficits. Neck: No palpable neck masses.  Studies Reviewed: Maxillo-facial CT    Assessment/Plan Endoscopic ethmoidectomy and concha bullosa resection.  Kemaria Dedic 03/24/2013, 7:16 AM

## 2013-03-24 NOTE — Anesthesia Preprocedure Evaluation (Addendum)
Anesthesia Evaluation  Patient identified by MRN, date of birth, ID band Patient awake    Reviewed: Allergy & Precautions, H&P , NPO status , Patient's Chart, lab work & pertinent test results  History of Anesthesia Complications Negative for: history of anesthetic complications  Airway Mallampati: I TM Distance: >3 FB Neck ROM: Full    Dental no notable dental hx. (+) Teeth Intact and Dental Advisory Given   Pulmonary neg pulmonary ROS,  breath sounds clear to auscultation  Pulmonary exam normal       Cardiovascular hypertension, Pt. on medications Rhythm:Regular Rate:Normal     Neuro/Psych negative neurological ROS  negative psych ROS   GI/Hepatic Neg liver ROS, GERD-  Medicated and Controlled,  Endo/Other  negative endocrine ROS  Renal/GU negative Renal ROS  negative genitourinary   Musculoskeletal   Abdominal   Peds  Hematology negative hematology ROS (+)   Anesthesia Other Findings   Reproductive/Obstetrics negative OB ROS                          Anesthesia Physical Anesthesia Plan  ASA: II  Anesthesia Plan: General   Post-op Pain Management:    Induction: Intravenous  Airway Management Planned: Oral ETT  Additional Equipment:   Intra-op Plan:   Post-operative Plan: Extubation in OR  Informed Consent: I have reviewed the patients History and Physical, chart, labs and discussed the procedure including the risks, benefits and alternatives for the proposed anesthesia with the patient or authorized representative who has indicated his/her understanding and acceptance.   Dental advisory given  Plan Discussed with: CRNA  Anesthesia Plan Comments:         Anesthesia Quick Evaluation

## 2013-03-24 NOTE — Transfer of Care (Signed)
Immediate Anesthesia Transfer of Care Note  Patient: [name]  Procedure(s) Performed: Procedure(s): ENDOSCOPIC CONCHA BULLOSA RESECTION (Left) ETHMOIDECTOMY (Left)  Patient Location: PACU  Anesthesia Type:General  Level of Consciousness: awake, alert  and oriented  Airway & Oxygen Therapy: Patient Spontanous Breathing and Patient connected to face mask oxygen  Post-op Assessment: Report given to PACU RN and Post -op Vital signs reviewed and stable  Post vital signs: Reviewed and stable  Complications: No apparent anesthesia complications

## 2013-03-24 NOTE — Anesthesia Procedure Notes (Signed)
Procedure Name: Intubation Date/Time: 03/24/2013 7:38 AM Performed by: Maryella Shivers Pre-anesthesia Checklist: Patient identified, Emergency Drugs available, Suction available and Patient being monitored Patient Re-evaluated:Patient Re-evaluated prior to inductionOxygen Delivery Method: Circle System Utilized Preoxygenation: Pre-oxygenation with 100% oxygen Intubation Type: IV induction Ventilation: Mask ventilation without difficulty Laryngoscope Size: Mac and 3 Grade View: Grade I Tube type: Oral Tube size: 6.5 mm Number of attempts: 1 Airway Equipment and Method: stylet and oral airway Placement Confirmation: ETT inserted through vocal cords under direct vision,  positive ETCO2 and breath sounds checked- equal and bilateral Secured at: 21 cm Tube secured with: Tape Dental Injury: Teeth and Oropharynx as per pre-operative assessment

## 2013-03-24 NOTE — Discharge Instructions (Signed)
Avoid heavy physical activity for 2 weeks.   Post Anesthesia Home Care Instructions  Activity: Get plenty of rest for the remainder of the day. A responsible adult should stay with you for 24 hours following the procedure.  For the next 24 hours, DO NOT: -Drive a car -Paediatric nurse -Drink alcoholic beverages -Take any medication unless instructed by your physician -Make any legal decisions or sign important papers.  Meals: Start with liquid foods such as gelatin or soup. Progress to regular foods as tolerated. Avoid greasy, spicy, heavy foods. If nausea and/or vomiting occur, drink only clear liquids until the nausea and/or vomiting subsides. Call your physician if vomiting continues.  Special Instructions/Symptoms: Your throat may feel dry or sore from the anesthesia or the breathing tube placed in your throat during surgery. If this causes discomfort, gargle with warm salt water. The discomfort should disappear within 24 hours.

## 2013-03-24 NOTE — Op Note (Signed)
OPERATIVE REPORT  DATE OF SURGERY: 03/24/2013  PATIENT:  [name],  64 y.o. female  PRE-OPERATIVE DIAGNOSIS:  CONCHA BULLOSA  POST-OPERATIVE DIAGNOSIS:  CONCHA BULLOSA  PROCEDURE:  Procedure(s): ENDOSCOPIC CONCHA BULLOSA RESECTION ETHMOIDECTOMY  SURGEON:  Beckie Salts, MD  ASSISTANTS: none  ANESTHESIA:   General   EBL:  20 ml  DRAINS: none  LOCAL MEDICATIONS USED:  1% Xylocaine with epinephrine  SPECIMEN:  Left nasal and sinus contents  COUNTS:  Correct  PROCEDURE DETAILS: The patient was taken to the operating room and placed on the operating table in the supine position. Following induction of general endotracheal anesthesia, the face was draped in a standard fashion for sinus surgery. Afrin spray was used preoperatively in the nasal cavities. 1% Xylocaine with epinephrine was infiltrated into the superior and posterior attachments of the left middle turbinate and the lateral nasal wall on the left. The middle turbinate was very large and solid. A sickle knife was used to attempt to enter into the cavity of the middle turbinate concha bullosa. This was unsuccessful as it was mainly solid bone. A Tru-Cut forceps was used to bite away the lateral part of the middle turbinate. There was a sinus cavity posteriorly within the middle turbinate and anterior portion was all thickened bone. The entire lateral lamina of the middle turbinate was removed using combination of Tru-Cut forceps and the microdebrider. The medial lamella was kept intact. The infundibulum was nicely exposed and an anterior ethmoidectomy was completed. There was thickened mucosa and some thickened mucus within some of the anterior cells. The frontal recess was explored and was patent. The uncinate was resected using the microdebrider and forceps exposing the maxillary ostium. This was clear blood and secretions were suctioned and the ethmoid cavity was packed with NasalPore coated with bacitracin ointment. The  pharynx suctioned of blood and secretions. The patient was awakened extubated and transferred to recovery in stable condition.    PATIENT DISPOSITION:  To PACU, stable

## 2013-03-25 ENCOUNTER — Encounter (HOSPITAL_BASED_OUTPATIENT_CLINIC_OR_DEPARTMENT_OTHER): Payer: Self-pay | Admitting: Otolaryngology

## 2013-04-08 ENCOUNTER — Encounter: Payer: Self-pay | Admitting: Internal Medicine

## 2013-04-08 NOTE — Telephone Encounter (Signed)
Copy of Dr. Clayborn Heron explanation letter sent to pt via My Chart

## 2013-04-21 ENCOUNTER — Other Ambulatory Visit: Payer: Self-pay | Admitting: Obstetrics & Gynecology

## 2013-04-28 HISTORY — PX: ENDOMETRIAL BIOPSY: SHX622

## 2013-05-05 ENCOUNTER — Other Ambulatory Visit: Payer: Self-pay

## 2013-05-05 DIAGNOSIS — Z1231 Encounter for screening mammogram for malignant neoplasm of breast: Secondary | ICD-10-CM

## 2013-05-19 ENCOUNTER — Encounter: Payer: Self-pay | Admitting: Internal Medicine

## 2013-05-19 ENCOUNTER — Other Ambulatory Visit (INDEPENDENT_AMBULATORY_CARE_PROVIDER_SITE_OTHER): Payer: PRIVATE HEALTH INSURANCE

## 2013-05-19 ENCOUNTER — Ambulatory Visit (INDEPENDENT_AMBULATORY_CARE_PROVIDER_SITE_OTHER): Payer: PRIVATE HEALTH INSURANCE | Admitting: Internal Medicine

## 2013-05-19 VITALS — BP 120/80 | HR 65 | Temp 97.9°F | Resp 12 | Ht 62.5 in | Wt 166.2 lb

## 2013-05-19 DIAGNOSIS — Z0000 Encounter for general adult medical examination without abnormal findings: Secondary | ICD-10-CM

## 2013-05-19 DIAGNOSIS — E782 Mixed hyperlipidemia: Secondary | ICD-10-CM

## 2013-05-19 DIAGNOSIS — M1990 Unspecified osteoarthritis, unspecified site: Secondary | ICD-10-CM

## 2013-05-19 LAB — LIPID PANEL
CHOL/HDL RATIO: 3
Cholesterol: 165 mg/dL (ref 0–200)
HDL: 61.4 mg/dL (ref >39.00)
LDL CALC: 94 mg/dL (ref 0–99)
TRIGLYCERIDES: 49 mg/dL (ref 0.0–149.0)
VLDL: 9.8 mg/dL (ref 0.0–40.0)

## 2013-05-19 LAB — CBC WITH DIFFERENTIAL/PLATELET
BASOS ABS: 0 10*3/uL (ref 0.0–0.1)
Basophils Relative: 0.6 % (ref 0.0–3.0)
Eosinophils Absolute: 0.1 10*3/uL (ref 0.0–0.7)
Eosinophils Relative: 1.3 % (ref 0.0–5.0)
HCT: 43.1 % (ref 36.0–46.0)
Hemoglobin: 14.5 g/dL (ref 12.0–15.0)
Lymphocytes Relative: 19.5 % (ref 12.0–46.0)
Lymphs Abs: 1.2 10*3/uL (ref 0.7–4.0)
MCHC: 33.5 g/dL (ref 30.0–36.0)
MCV: 98.8 fl (ref 78.0–100.0)
MONO ABS: 0.3 10*3/uL (ref 0.1–1.0)
Monocytes Relative: 5.6 % (ref 3.0–12.0)
NEUTROS PCT: 73 % (ref 43.0–77.0)
Neutro Abs: 4.6 10*3/uL (ref 1.4–7.7)
PLATELETS: 273 10*3/uL (ref 150.0–400.0)
RBC: 4.37 Mil/uL (ref 3.87–5.11)
RDW: 12.5 % (ref 11.5–14.6)
WBC: 6.2 10*3/uL (ref 4.5–10.5)

## 2013-05-19 LAB — HEPATIC FUNCTION PANEL
ALT: 29 U/L (ref 0–35)
AST: 30 U/L (ref 0–37)
Albumin: 4 g/dL (ref 3.5–5.2)
Alkaline Phosphatase: 56 U/L (ref 39–117)
BILIRUBIN DIRECT: 0.1 mg/dL (ref 0.0–0.3)
BILIRUBIN TOTAL: 0.8 mg/dL (ref 0.3–1.2)
Total Protein: 7.1 g/dL (ref 6.0–8.3)

## 2013-05-19 LAB — TSH: TSH: 1.52 u[IU]/mL (ref 0.35–5.50)

## 2013-05-19 MED ORDER — SPIRONOLACTONE 25 MG PO TABS: 25.0000 mg | ORAL_TABLET | Freq: Two times a day (BID) | ORAL | Status: DC

## 2013-05-19 MED ORDER — PRAVASTATIN SODIUM 20 MG PO TABS: 20.0000 mg | ORAL_TABLET | Freq: Every day | ORAL | Status: DC

## 2013-05-19 MED ORDER — ZOLPIDEM TARTRATE 5 MG PO TABS: ORAL_TABLET | ORAL | Status: DC

## 2013-05-19 NOTE — Patient Instructions (Signed)
Your next office appointment will be determined based upon review of your pending labs. Those instructions will be transmitted to you through My Chart . 

## 2013-05-19 NOTE — Progress Notes (Signed)
   Subjective:    Patient ID: [name], female    DOB: Aug 26, 1950, 63 y.o.   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    Review of Systems   She's had a recent exacerbation of the  arthritic pain. She uses meloxicam & Vicodin as needed. She's had 2 SI joint injections with significant response. Her Orthopedist has moved out of state. She is considering seeing a sports medicine specialist.     Objective:   Physical Exam  Gen.: Healthy and well-nourished in appearance. Alert, appropriate and cooperative throughout exam. Appears younger than stated age  Head: Normocephalic without obvious abnormalities  Eyes: No corneal or conjunctival inflammation noted. Pupils equal round reactive to light and accommodation. Extraocular motion intact.  Ears: External  ear exam reveals no significant lesions or deformities. Canals clear .TMs normal. Hearing is grossly normal bilaterally. Nose: External nasal exam reveals no deformity or inflammation. Nasal mucosa are pink and moist. No lesions or exudates noted.   Mouth: Oral mucosa and oropharynx reveal no lesions or exudates. Teeth in good repair. Neck: No deformities, masses, or tenderness noted. Range of motion slightly. Thyroid normsal, R lobe > L w/o nodules. Lungs: Normal respiratory effort; chest expands symmetrically. Lungs are clear to auscultation without rales, wheezes, or increased work of breathing. Heart: Normal rate and rhythm. Normal S1 and S2. No gallop, click, or rub. No murmur. Abdomen: Bowel sounds normal; abdomen soft and nontender. No masses, organomegaly or hernias noted. Genitalia: as per Gyn                                  Musculoskeletal/extremities: No deformity or scoliosis noted of  the thoracic or lumbar spine.   No clubbing, cyanosis, edema, or significant extremity  deformity noted. Range of motion normal .Tone & strength normal. Hand  joints normal except post trauma change R DIP   Fingernail health good. Able to lie down & sit up w/o help. Negative SLR bilaterally Vascular: Carotid, radial artery, dorsalis pedis and  posterior tibial pulses are full and equal. No bruits present. Neurologic: Alert and oriented x3. Deep tendon reflexes symmetrical and normal.   Skin: Intact without suspicious lesions or rashes. Lymph: No cervical, axillary lymphadenopathy present. Psych: Mood and affect are normal. Normally interactive                                                                                        Assessment & Plan:  #1 comprehensive physical exam; no acute findings  Plan: see Orders  & Recommendations

## 2013-05-19 NOTE — Progress Notes (Signed)
Pre visit review using our clinic review tool, if applicable. No additional management support is needed unless otherwise documented below in the visit note. 

## 2013-05-22 LAB — VITAMIN D 1,25 DIHYDROXY
VITAMIN D 1, 25 (OH) TOTAL: 65 pg/mL (ref 18–72)
VITAMIN D3 1, 25 (OH): 65 pg/mL
Vitamin D2 1, 25 (OH)2: <8 pg/mL

## 2013-06-09 ENCOUNTER — Ambulatory Visit: Payer: PRIVATE HEALTH INSURANCE | Admitting: Sports Medicine

## 2013-06-16 ENCOUNTER — Encounter: Payer: Self-pay | Admitting: Sports Medicine

## 2013-06-16 ENCOUNTER — Ambulatory Visit (INDEPENDENT_AMBULATORY_CARE_PROVIDER_SITE_OTHER): Payer: PRIVATE HEALTH INSURANCE | Admitting: Sports Medicine

## 2013-06-16 ENCOUNTER — Ambulatory Visit
Admission: RE | Admit: 2013-06-16 | Discharge: 2013-06-16 | Disposition: A | Discharge status: Home / Self Care | Payer: PRIVATE HEALTH INSURANCE | Source: Ambulatory Visit

## 2013-06-16 ENCOUNTER — Ambulatory Visit
Admission: RE | Admit: 2013-06-16 | Discharge: 2013-06-16 | Disposition: A | Discharge status: Home / Self Care | Payer: PRIVATE HEALTH INSURANCE | Source: Ambulatory Visit | Attending: Sports Medicine | Admitting: Sports Medicine

## 2013-06-16 VITALS — BP 119/82 | HR 66 | Ht 62.5 in | Wt 166.0 lb

## 2013-06-16 DIAGNOSIS — M545 Low back pain, unspecified: Secondary | ICD-10-CM

## 2013-06-16 DIAGNOSIS — Z1231 Encounter for screening mammogram for malignant neoplasm of breast: Secondary | ICD-10-CM

## 2013-06-16 NOTE — Progress Notes (Signed)
   Subjective:    Patient ID: [name], female    DOB: 1950/08/27, 63 y.o.   MRN: [id number]  HPI chief complaint: Low back pain  Very pleasant 63 year old female comes in today complaining of 14 months of low back pain. No trauma that she can recall. She has undergone 2 previous left sided SI joint injections which did improve some of her pain. However, she tells me that she still has some persistent discomfort in the middle of her lumbar spine with radiating discomfort to the left. This pain has been constant despite her SI joint injections. Pain was exacerbated one month ago when she was painting baseboards at her house. Pain is most noticeable first thing in the morning but will occasionally awaken her at night as well. No groin pain. No radiating pain into her legs. No associated numbness or tingling. No prior low back surgeries. She has tried meloxicam 7.5 mg twice daily but it causes stomach upset. She has also tried Vicodin in the past. She had SI joint x-rays done last year which were unremarkable. She has not had any imaging of her lumbar spine.  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 No acute distress. Awake alert and oriented x3. Vital signs reviewed.  Lumbar spine: Full painless lumbar range of motion. There is some tenderness to palpation along the lumbar midline as well is in the paraspinal musculature to the left of L5-S1. Some spasm as well. No tenderness over the SI joint but pain does occur with FABER testing. Negative straight leg raise bilaterally. Negative log roll bilaterally. Strength is 5/5 both lower extremities with reflexes 2/4 at the Achilles and patellar tendons bilaterally. No atrophy. Neurovascularly intact distally.      Assessment & Plan:  Chronic low back pain SI joint dysfunction  X-rays and MRI of her lumbar spine. This is to evaluate for degenerative disc disease or facet arthropathy that may benefit from a diagnostic/therapeutic injection. Although some of her symptoms are arising from her SI joint, I think that her more chronic pain may be originating from her lumbar spine. I want her to stop taking her Mobic twice daily. Instead, she will take 15 mg daily as needed but will discontinue if stomach upset occurs. I will call her with results of her MRI once available. She will no doubt need some formal physical therapy in addition to whatever other treatment we decided upon.

## 2013-06-23 ENCOUNTER — Ambulatory Visit
Admission: RE | Admit: 2013-06-23 | Discharge: 2013-06-23 | Disposition: A | Discharge status: Home / Self Care | Payer: PRIVATE HEALTH INSURANCE | Source: Ambulatory Visit | Attending: Sports Medicine | Admitting: Sports Medicine

## 2013-06-23 DIAGNOSIS — M545 Low back pain, unspecified: Secondary | ICD-10-CM

## 2013-06-27 ENCOUNTER — Encounter: Payer: Self-pay | Admitting: Sports Medicine

## 2013-06-30 NOTE — Telephone Encounter (Signed)
I spoke to the patient on the phone after reviewing the MRI scan of her lumbar spine. There is mild left neural foraminal lateral recess stenosis at L2-L3 due to a left foraminal disc protrusion. She also has right greater than left facet hypertrophy at L3-L4 and Schmorl's nodes at L2-3 with prominent adjacent marrow edema. No obvious surgical pathology. My recommendation at this point in time is for the patient to see Dr.Ibazebo for evaluation. She may benefit from either a lumbar ESI or a facet injection but I would like Dr.Ibazebo's input before ordering these.

## 2013-07-14 ENCOUNTER — Other Ambulatory Visit: Payer: Self-pay | Admitting: Physical Medicine and Rehabilitation

## 2013-07-14 DIAGNOSIS — M51369 Other intervertebral disc degeneration, lumbar region without mention of lumbar back pain or lower extremity pain: Secondary | ICD-10-CM

## 2013-07-14 DIAGNOSIS — IMO0002 Reserved for concepts with insufficient information to code with codable children: Secondary | ICD-10-CM

## 2013-07-14 DIAGNOSIS — M5136 Other intervertebral disc degeneration, lumbar region: Secondary | ICD-10-CM

## 2013-07-14 DIAGNOSIS — M5126 Other intervertebral disc displacement, lumbar region: Secondary | ICD-10-CM

## 2013-07-15 ENCOUNTER — Ambulatory Visit
Admission: RE | Admit: 2013-07-15 | Discharge: 2013-07-15 | Disposition: A | Discharge status: Home / Self Care | Payer: PRIVATE HEALTH INSURANCE | Source: Ambulatory Visit | Attending: Physical Medicine and Rehabilitation | Admitting: Physical Medicine and Rehabilitation

## 2013-07-15 VITALS — BP 133/69 | HR 64

## 2013-07-15 DIAGNOSIS — M5136 Other intervertebral disc degeneration, lumbar region: Secondary | ICD-10-CM

## 2013-07-15 DIAGNOSIS — IMO0002 Reserved for concepts with insufficient information to code with codable children: Secondary | ICD-10-CM

## 2013-07-15 DIAGNOSIS — M5126 Other intervertebral disc displacement, lumbar region: Secondary | ICD-10-CM

## 2013-07-15 MED ORDER — METHYLPREDNISOLONE ACETATE 40 MG/ML INJ SUSP (RADIOLOG
120.0000 mg | Freq: Once | INTRAMUSCULAR | Status: AC
Start: 2013-07-15 — End: 2013-07-15
  Administered 2013-07-15: 120 mg via EPIDURAL

## 2013-07-15 MED ORDER — IOHEXOL 180 MG/ML  SOLN
1.0000 mL | Freq: Once | INTRAMUSCULAR | Status: AC | PRN
  Administered 2013-07-15: 1 mL via INTRAVENOUS

## 2013-07-21 ENCOUNTER — Encounter: Payer: Self-pay | Admitting: Internal Medicine

## 2013-09-15 ENCOUNTER — Encounter: Payer: Self-pay | Admitting: Sports Medicine

## 2013-09-15 ENCOUNTER — Other Ambulatory Visit: Payer: Self-pay | Admitting: Sports Medicine

## 2013-09-15 ENCOUNTER — Other Ambulatory Visit: Payer: Self-pay | Admitting: *Deleted

## 2013-09-15 DIAGNOSIS — M5442 Lumbago with sciatica, left side: Secondary | ICD-10-CM

## 2013-09-18 ENCOUNTER — Ambulatory Visit
Admission: RE | Admit: 2013-09-18 | Discharge: 2013-09-18 | Disposition: A | Discharge status: Home / Self Care | Payer: PRIVATE HEALTH INSURANCE | Source: Ambulatory Visit | Attending: Sports Medicine | Admitting: Sports Medicine

## 2013-09-18 DIAGNOSIS — M5442 Lumbago with sciatica, left side: Secondary | ICD-10-CM

## 2013-09-18 MED ORDER — IOHEXOL 180 MG/ML  SOLN
1.0000 mL | Freq: Once | INTRAMUSCULAR | Status: AC | PRN
  Administered 2013-09-18: 1 mL via INTRA_ARTICULAR

## 2013-09-18 MED ORDER — METHYLPREDNISOLONE ACETATE 40 MG/ML INJ SUSP (RADIOLOG
120.0000 mg | Freq: Once | INTRAMUSCULAR | Status: AC
  Administered 2013-09-18: 120 mg via INTRA_ARTICULAR

## 2013-09-26 ENCOUNTER — Encounter: Payer: Self-pay | Admitting: Internal Medicine

## 2013-09-26 NOTE — Progress Notes (Signed)
Received a fax from [facility] requesting most recent office visit, lab testing, and ekg faxed to [phone] for anesthesia clearance.

## 2013-09-29 ENCOUNTER — Other Ambulatory Visit: Payer: Self-pay | Admitting: Obstetrics & Gynecology

## 2014-01-16 ENCOUNTER — Other Ambulatory Visit: Payer: Self-pay | Admitting: Internal Medicine

## 2014-01-16 MED ORDER — ZOLPIDEM TARTRATE 5 MG PO TABS: ORAL_TABLET | ORAL | Status: DC

## 2014-02-26 ENCOUNTER — Other Ambulatory Visit: Payer: Self-pay | Admitting: Obstetrics & Gynecology

## 2014-03-02 LAB — CYTOLOGY - PAP

## 2014-04-07 ENCOUNTER — Ambulatory Visit (INDEPENDENT_AMBULATORY_CARE_PROVIDER_SITE_OTHER): Payer: PRIVATE HEALTH INSURANCE

## 2014-04-07 VITALS — BP 120/74 | HR 59 | Resp 18

## 2014-04-07 DIAGNOSIS — M21621 Bunionette of right foot: Secondary | ICD-10-CM

## 2014-04-07 DIAGNOSIS — R52 Pain, unspecified: Secondary | ICD-10-CM

## 2014-04-07 DIAGNOSIS — Q828 Other specified congenital malformations of skin: Secondary | ICD-10-CM

## 2014-04-07 DIAGNOSIS — M205X1 Other deformities of toe(s) (acquired), right foot: Secondary | ICD-10-CM

## 2014-04-07 DIAGNOSIS — B070 Plantar wart: Secondary | ICD-10-CM

## 2014-04-07 NOTE — Patient Instructions (Signed)

## 2014-04-07 NOTE — Progress Notes (Signed)
   Subjective:    Patient ID: AVERLY ERICSON, female    DOB: 1950-03-24, 64 y.o.   MRN: [id number]  HPI I HAVE A SPOT ON THE 5TH TOE ON THE RIGHT FOOT AND HURTS WITH RUBBING AND IS SORE AND HURTS IF I AM ON THE TREADMILL AND FEELS BETTER WHEN I GO BAREFOOT    Review of Systems  Musculoskeletal: Positive for back pain.  Allergic/Immunologic: Positive for environmental allergies.  All other systems reviewed and are negative.      Objective:   Physical Exam Neurovascular status is intact pedal pulses are palpable DP and PT +2 over 4 Refill time 3 seconds all digits epicritic and proprioceptive sensations intact and symmetric there is normal plantar response DTRs not listed dermatologically skin color pigment normal hair growth absent nails unremarkable patient is status post bunionectomy right foot. Does have slight tailor bunion deformity with lateral deviation of the fifth metatarsal and medial deviation under lapping of fifth digit there is nucleated keratotic lesion present a lateral fifth MTP area. X-rays confirm elevated I am angle 45 with slight tailor bunion deformity and increased lateral deviation angle. Neurologically he keratotic lesion consistent with porokeratosis versus verruca. Remainder the exam unremarkable noncontributory       Assessment & Plan:  Assessment porokeratosis versus verruca over the fifth MTP area right foot cannot rule out tailor bunion although the bunion does not seem to be symptomatic is resume more associated with the skin lesion itself. At this time lesion is debrided to slight pinpoint bleed it is of the area is nucleated and Neosporin and Band-Aid dressing applied patient will monitor this area if there is recurrence or further issues maintain treatment either with excision of verruca or more aggressive treatment for verruca for recurrence or possibly a bunion procedure at some point in the future if that becomes symptomatic. Gwendalyn Ege needed next  [employer] DPM

## 2014-04-23 ENCOUNTER — Other Ambulatory Visit: Payer: Self-pay | Admitting: Physical Medicine and Rehabilitation

## 2014-04-23 ENCOUNTER — Ambulatory Visit
Admission: RE | Admit: 2014-04-23 | Discharge: 2014-04-23 | Disposition: A | Discharge status: Home / Self Care | Payer: PRIVATE HEALTH INSURANCE | Source: Ambulatory Visit | Attending: Physical Medicine and Rehabilitation | Admitting: Physical Medicine and Rehabilitation

## 2014-04-23 DIAGNOSIS — M79605 Pain in left leg: Principal | ICD-10-CM

## 2014-04-23 DIAGNOSIS — M545 Low back pain, unspecified: Secondary | ICD-10-CM

## 2014-04-23 MED ORDER — METHYLPREDNISOLONE ACETATE 40 MG/ML INJ SUSP (RADIOLOG
120.0000 mg | Freq: Once | INTRAMUSCULAR | Status: AC
  Administered 2014-04-23: 120 mg via EPIDURAL

## 2014-04-23 MED ORDER — IOHEXOL 180 MG/ML  SOLN
1.0000 mL | Freq: Once | INTRAMUSCULAR | Status: AC | PRN
  Administered 2014-04-23: 1 mL via EPIDURAL

## 2014-05-18 ENCOUNTER — Other Ambulatory Visit: Payer: Self-pay

## 2014-05-18 DIAGNOSIS — Z1231 Encounter for screening mammogram for malignant neoplasm of breast: Secondary | ICD-10-CM

## 2014-05-20 ENCOUNTER — Other Ambulatory Visit: Payer: Self-pay | Admitting: Physical Medicine and Rehabilitation

## 2014-05-20 ENCOUNTER — Ambulatory Visit
Admission: RE | Admit: 2014-05-20 | Discharge: 2014-05-20 | Disposition: A | Discharge status: Home / Self Care | Payer: PRIVATE HEALTH INSURANCE | Source: Ambulatory Visit | Attending: Physical Medicine and Rehabilitation | Admitting: Physical Medicine and Rehabilitation

## 2014-05-20 DIAGNOSIS — M545 Low back pain, unspecified: Secondary | ICD-10-CM

## 2014-05-20 DIAGNOSIS — M79605 Pain in left leg: Principal | ICD-10-CM

## 2014-05-20 MED ORDER — METHYLPREDNISOLONE ACETATE 40 MG/ML INJ SUSP (RADIOLOG
120.0000 mg | Freq: Once | INTRAMUSCULAR | Status: AC
  Administered 2014-05-20: 120 mg via EPIDURAL

## 2014-05-20 MED ORDER — IOHEXOL 180 MG/ML  SOLN
1.0000 mL | Freq: Once | INTRAMUSCULAR | Status: AC | PRN
  Administered 2014-05-20: 1 mL via EPIDURAL

## 2014-05-25 ENCOUNTER — Encounter: Payer: Self-pay | Admitting: Sports Medicine

## 2014-05-25 ENCOUNTER — Ambulatory Visit (INDEPENDENT_AMBULATORY_CARE_PROVIDER_SITE_OTHER): Payer: PRIVATE HEALTH INSURANCE | Admitting: Sports Medicine

## 2014-05-25 VITALS — BP 136/80 | HR 95 | Ht 63.0 in | Wt 166.0 lb

## 2014-05-25 DIAGNOSIS — N926 Irregular menstruation, unspecified: Secondary | ICD-10-CM

## 2014-05-25 DIAGNOSIS — E785 Hyperlipidemia, unspecified: Secondary | ICD-10-CM

## 2014-05-25 DIAGNOSIS — I10 Essential (primary) hypertension: Secondary | ICD-10-CM

## 2014-05-25 DIAGNOSIS — M47816 Spondylosis without myelopathy or radiculopathy, lumbar region: Secondary | ICD-10-CM

## 2014-05-25 DIAGNOSIS — Z0000 Encounter for general adult medical examination without abnormal findings: Secondary | ICD-10-CM | POA: Insufficient documentation

## 2014-05-25 LAB — LIPID PANEL
Cholesterol: 185 mg/dL (ref 0–200)
HDL: 67 mg/dL (ref >=46)
LDL Cholesterol: 96 mg/dL (ref 0–99)
Total CHOL/HDL Ratio: 2.8 Ratio
Triglycerides: 111 mg/dL (ref <150)
VLDL: 22 mg/dL (ref 0–40)

## 2014-05-25 LAB — COMPREHENSIVE METABOLIC PANEL
Albumin: 4.5 g/dL (ref 3.5–5.2)
BUN: 15 mg/dL (ref 6–23)
CO2: 28 mEq/L (ref 19–32)
Glucose, Bld: 83 mg/dL (ref 70–99)
Potassium: 4 mEq/L (ref 3.5–5.3)
Sodium: 137 mEq/L (ref 135–145)
Total Protein: 7.1 g/dL (ref 6.0–8.3)

## 2014-05-25 LAB — CBC
HCT: 46.1 % — ABNORMAL HIGH (ref 36.0–46.0)
Hemoglobin: 15.6 g/dL — ABNORMAL HIGH (ref 12.0–15.0)
MCH: 33.1 pg (ref 26.0–34.0)
MCHC: 33.8 g/dL (ref 30.0–36.0)
MCV: 97.9 fL (ref 78.0–100.0)
MPV: 9.3 fL (ref 8.6–12.4)
Platelets: 265 10*3/uL (ref 150–400)
RBC: 4.71 MIL/uL (ref 3.87–5.11)
RDW: 12.8 % (ref 11.5–15.5)
WBC: 7.9 10*3/uL (ref 4.0–10.5)

## 2014-05-25 LAB — TSH: TSH: 0.498 u[IU]/mL (ref 0.350–4.500)

## 2014-05-25 LAB — COMPREHENSIVE METABOLIC PANEL WITH GFR
ALT: 40 U/L — ABNORMAL HIGH (ref 0–35)
AST: 20 U/L (ref 0–37)
Alkaline Phosphatase: 70 U/L (ref 39–117)
Calcium: 10.1 mg/dL (ref 8.4–10.5)
Chloride: 99 meq/L (ref 96–112)
Creat: 0.87 mg/dL (ref 0.50–1.10)
Total Bilirubin: 0.7 mg/dL (ref 0.2–1.2)

## 2014-05-25 LAB — HEMOGLOBIN A1C
Hgb A1c MFr Bld: 5.2 % (ref <5.7)
Mean Plasma Glucose: 103 mg/dL (ref <117)

## 2014-05-25 MED ORDER — PRAVASTATIN SODIUM 20 MG PO TABS: 20.0000 mg | ORAL_TABLET | Freq: Every day | ORAL | Status: DC

## 2014-05-25 MED ORDER — SPIRONOLACTONE 25 MG PO TABS: 25.0000 mg | ORAL_TABLET | Freq: Two times a day (BID) | ORAL | Status: DC

## 2014-05-25 NOTE — Progress Notes (Signed)
  Subjective:    CC: Establish care/CPE.   HPI:  This is a very pleasant 64 year old female here to establish care, she does have a history of lumbar spondylosis, has had epidurals, as well as SI joint injections and does very well.  Hyperlipidemia: Stable on pravastatin.  Hypertension: Stable on Spironolactone.  Past medical history, Surgical history, Family history not pertinant except as noted below, Social history, Allergies, and medications have been entered into the medical record, reviewed, and no changes needed.   Review of Systems: No headache, visual changes, nausea, vomiting, diarrhea, constipation, dizziness, abdominal pain, skin rash, fevers, chills, night sweats, swollen lymph nodes, weight loss, chest pain, body aches, joint swelling, muscle aches, shortness of breath, mood changes, visual or auditory hallucinations.  Objective:    General: Well Developed, well nourished, and in no acute distress.  Neuro: Alert and oriented x3, extra-ocular muscles intact, sensation grossly intact. Cranial nerves II through XII are intact, motor, sensory, and coordinative functions are all intact. HEENT: Normocephalic, atraumatic, pupils equal round reactive to light, neck supple, no masses, no lymphadenopathy, thyroid nonpalpable. Oropharynx, nasopharynx, external ear canals are unremarkable. Skin: Warm and dry, no rashes noted.  Cardiac: Regular rate and rhythm, no murmurs rubs or gallops.  Respiratory: Clear to auscultation bilaterally. Not using accessory muscles, speaking in full sentences.  Abdominal: Soft, nontender, nondistended, positive bowel sounds, no masses, no organomegaly.  Musculoskeletal: Shoulder, elbow, wrist, hip, knee, ankle stable, and with full range of motion.  Impression and Recommendations:    The patient was counselled, risk factors were discussed, anticipatory guidance given.

## 2014-05-25 NOTE — Assessment & Plan Note (Signed)
Well-controlled, continue spironolactone

## 2014-05-25 NOTE — Assessment & Plan Note (Signed)
Rechecking blood work. Up-to-date on mammogram, cervical cancer screening, bone densitometry. Declines standard colonoscopy, has been getting virtual colonoscopies.

## 2014-05-25 NOTE — Assessment & Plan Note (Signed)
Has been controlled, continue pravastatin, rechecking blood work.

## 2014-05-25 NOTE — Assessment & Plan Note (Signed)
Has had L2-L3 epidurals, L5-S1 epidurals, and left sacral iliac joint injections, all that provided good and immediate relief. She typically gets her injections a [facility], we can do her SI joint injections should she be unable to get in with him otherwise they will continue to manage.

## 2014-05-28 ENCOUNTER — Encounter: Payer: Self-pay | Admitting: Sports Medicine

## 2014-06-18 ENCOUNTER — Ambulatory Visit
Admission: RE | Admit: 2014-06-18 | Discharge: 2014-06-18 | Disposition: A | Discharge status: Home / Self Care | Payer: PRIVATE HEALTH INSURANCE | Source: Ambulatory Visit

## 2014-06-18 DIAGNOSIS — Z1231 Encounter for screening mammogram for malignant neoplasm of breast: Secondary | ICD-10-CM

## 2014-06-22 ENCOUNTER — Telehealth: Payer: Self-pay

## 2014-06-22 ENCOUNTER — Other Ambulatory Visit: Payer: Self-pay | Admitting: Sports Medicine

## 2014-06-22 DIAGNOSIS — Z1211 Encounter for screening for malignant neoplasm of colon: Secondary | ICD-10-CM

## 2014-06-22 DIAGNOSIS — R928 Other abnormal and inconclusive findings on diagnostic imaging of breast: Secondary | ICD-10-CM

## 2014-06-22 DIAGNOSIS — M533 Sacrococcygeal disorders, not elsewhere classified: Secondary | ICD-10-CM

## 2014-06-22 NOTE — Telephone Encounter (Signed)
Orders placed , this will probably need to go through [facility] for the SI injection.

## 2014-06-22 NOTE — Telephone Encounter (Signed)
Patient called requested an order for SI injection to be done @ [facility] also she wants a order  colonoscopy  she says the way to order it is under CT VC screening and if the insurance does not pay for it she will pay for it her self out of pocket. Rhonda Cunningham,CMA

## 2014-06-23 ENCOUNTER — Other Ambulatory Visit: Payer: Self-pay

## 2014-06-23 ENCOUNTER — Other Ambulatory Visit: Payer: Self-pay | Admitting: Sports Medicine

## 2014-06-23 DIAGNOSIS — R928 Other abnormal and inconclusive findings on diagnostic imaging of breast: Secondary | ICD-10-CM

## 2014-06-23 NOTE — Telephone Encounter (Signed)
Spoke to patient gave her results as noted below. Maximilliano Kersh,CMA  

## 2014-06-24 ENCOUNTER — Other Ambulatory Visit: Payer: Self-pay | Admitting: Sports Medicine

## 2014-06-24 ENCOUNTER — Ambulatory Visit
Admission: RE | Admit: 2014-06-24 | Discharge: 2014-06-24 | Disposition: A | Discharge status: Home / Self Care | Payer: PRIVATE HEALTH INSURANCE | Source: Ambulatory Visit | Attending: Sports Medicine | Admitting: Sports Medicine

## 2014-06-24 DIAGNOSIS — R928 Other abnormal and inconclusive findings on diagnostic imaging of breast: Secondary | ICD-10-CM

## 2014-06-26 ENCOUNTER — Other Ambulatory Visit: Payer: PRIVATE HEALTH INSURANCE

## 2014-06-26 ENCOUNTER — Ambulatory Visit
Admission: RE | Admit: 2014-06-26 | Discharge: 2014-06-26 | Disposition: A | Discharge status: Home / Self Care | Payer: PRIVATE HEALTH INSURANCE | Source: Ambulatory Visit | Attending: Sports Medicine | Admitting: Sports Medicine

## 2014-06-26 DIAGNOSIS — M533 Sacrococcygeal disorders, not elsewhere classified: Secondary | ICD-10-CM

## 2014-06-26 MED ORDER — IOHEXOL 180 MG/ML  SOLN
1.0000 mL | Freq: Once | INTRAMUSCULAR | Status: AC | PRN
  Administered 2014-06-26: 1 mL via INTRA_ARTICULAR

## 2014-06-26 MED ORDER — METHYLPREDNISOLONE ACETATE 40 MG/ML INJ SUSP (RADIOLOG
120.0000 mg | Freq: Once | INTRAMUSCULAR | Status: AC
  Administered 2014-06-26: 120 mg via INTRA_ARTICULAR

## 2014-06-26 NOTE — Discharge Instructions (Signed)

## 2014-07-13 ENCOUNTER — Ambulatory Visit (INDEPENDENT_AMBULATORY_CARE_PROVIDER_SITE_OTHER): Payer: PRIVATE HEALTH INSURANCE

## 2014-07-13 ENCOUNTER — Encounter: Payer: Self-pay | Admitting: Sports Medicine

## 2014-07-13 ENCOUNTER — Ambulatory Visit (INDEPENDENT_AMBULATORY_CARE_PROVIDER_SITE_OTHER): Payer: PRIVATE HEALTH INSURANCE | Admitting: Sports Medicine

## 2014-07-13 VITALS — BP 136/87 | HR 62 | Wt 168.0 lb

## 2014-07-13 DIAGNOSIS — M25561 Pain in right knee: Secondary | ICD-10-CM

## 2014-07-13 DIAGNOSIS — M25562 Pain in left knee: Secondary | ICD-10-CM | POA: Diagnosis not present

## 2014-07-13 DIAGNOSIS — M170 Bilateral primary osteoarthritis of knee: Secondary | ICD-10-CM | POA: Insufficient documentation

## 2014-07-13 NOTE — Progress Notes (Signed)
  Subjective:    CC: Left knee pain  HPI: For the past month and a half to 2 months this pleasant 64 year old female has had pain that she localizes over the medial aspect of her left knee with swelling, she does get catching, buckling, and pain. Pain is moderate, persistent without radiation. She's tried some oral NSAIDs with only minimal improvement.  Past medical history, Surgical history, Family history not pertinant except as noted below, Social history, Allergies, and medications have been entered into the medical record, reviewed, and no changes needed.   Review of Systems: No fevers, chills, night sweats, weight loss, chest pain, or shortness of breath.   Objective:    General: Well Developed, well nourished, and in no acute distress.  Neuro: Alert and oriented x3, extra-ocular muscles intact, sensation grossly intact.  HEENT: Normocephalic, atraumatic, pupils equal round reactive to light, neck supple, no masses, no lymphadenopathy, thyroid nonpalpable.  Skin: Warm and dry, no rashes. Cardiac: Regular rate and rhythm, no murmurs rubs or gallops, no lower extremity edema.  Respiratory: Clear to auscultation bilaterally. Not using accessory muscles, speaking in full sentences. Left Knee: Mild effusion with tenderness over the medial joint line. ROM normal in flexion and extension and lower leg rotation. There is also pain at terminal flexion. Ligaments with solid consistent endpoints including ACL, PCL, LCL, MCL. Negative Mcmurray's and provocative meniscal tests. Non painful patellar compression. Patellar and quadriceps tendons unremarkable. Hamstring and quadriceps strength is normal.  Procedure: Real-time Ultrasound Guided Injection of left knee Device: GE Logiq E  Verbal informed consent obtained.  Time-out conducted.  Noted no overlying erythema, induration, or other signs of local infection.  Skin prepped in a sterile fashion.  Local anesthesia: Topical Ethyl chloride.    With sterile technique and under real time ultrasound guidance:  Noted mild effusion, 2 mL Kenalog 40, 4 mL lidocaine injected easily. Completed without difficulty  Pain immediately resolved suggesting accurate placement of the medication.  Advised to call if fevers/chills, erythema, induration, drainage, or persistent bleeding.  Images permanently stored and available for review in the ultrasound unit.  Impression: Technically successful ultrasound guided injection.  Impression and Recommendations:

## 2014-07-13 NOTE — Assessment & Plan Note (Signed)
With an effusion, there is likely a degenerative meniscal tear, versus an intra-articular loose body. Injection and aspiration, x-rays. I do suspect she will probably need an MRI and arthroscopy considering degree of mechanical symptoms. She would like the MRI at [facility].

## 2014-08-10 ENCOUNTER — Encounter: Payer: Self-pay | Admitting: Sports Medicine

## 2014-08-10 ENCOUNTER — Ambulatory Visit (INDEPENDENT_AMBULATORY_CARE_PROVIDER_SITE_OTHER): Payer: PRIVATE HEALTH INSURANCE | Admitting: Sports Medicine

## 2014-08-10 ENCOUNTER — Telehealth: Payer: Self-pay | Admitting: Sports Medicine

## 2014-08-10 VITALS — BP 127/82 | HR 66 | Ht 63.0 in | Wt 171.0 lb

## 2014-08-10 DIAGNOSIS — M1712 Unilateral primary osteoarthritis, left knee: Secondary | ICD-10-CM | POA: Diagnosis not present

## 2014-08-10 NOTE — Progress Notes (Signed)
  Subjective:    CC: Follow-up  HPI: Left knee primary osteoarthritis: [name] has done extremely well, considering her degree of mechanical symptoms at the previous visit, all mechanical symptoms have resolved with injection, she was putting down mulch in the yard, she also hiked 15 miles and did extremely well. She did have some recurrence of swelling, mental pain, but overall very happy with how things gone so far.  Past medical history, Surgical history, Family history not pertinant except as noted below, Social history, Allergies, and medications have been entered into the medical record, reviewed, and no changes needed.   Review of Systems: No fevers, chills, night sweats, weight loss, chest pain, or shortness of breath.   Objective:    General: Well Developed, well nourished, and in no acute distress.  Neuro: Alert and oriented x3, extra-ocular muscles intact, sensation grossly intact.  HEENT: Normocephalic, atraumatic, pupils equal round reactive to light, neck supple, no masses, no lymphadenopathy, thyroid nonpalpable.  Skin: Warm and dry, no rashes. Cardiac: Regular rate and rhythm, no murmurs rubs or gallops, no lower extremity edema.  Respiratory: Clear to auscultation bilaterally. Not using accessory muscles, speaking in full sentences. Left Knee: Moderate swelling with minimal tenderness ROM normal in flexion and extension and lower leg rotation. Ligaments with solid consistent endpoints including ACL, PCL, LCL, MCL. Negative Mcmurray's and provocative meniscal tests. Non painful patellar compression. Patellar and quadriceps tendons unremarkable. Hamstring and quadriceps strength is normal.  Procedure: Real-time Ultrasound Guided Injection of left knee Device: GE Logiq E  Verbal informed consent obtained.  Time-out conducted.  Noted no overlying erythema, induration, or other signs of local infection.  Skin prepped in a sterile fashion.  Local anesthesia: Topical Ethyl  chloride.  With sterile technique and under real time ultrasound guidance:  Aspirated 19 mL of straw-colored fluid, syringe switched and 1 mL kenalog 40, 4 mL lidocaine injected easily. Completed without difficulty  Pain immediately resolved suggesting accurate placement of the medication.  Advised to call if fevers/chills, erythema, induration, drainage, or persistent bleeding.  Images permanently stored and available for review in the ultrasound unit.  Impression: Technically successful ultrasound guided injection.  Impression and Recommendations:

## 2014-08-10 NOTE — Telephone Encounter (Signed)
-----   Message from Silverio Decamp, MD sent at 08/10/2014  4:31 PM EDT ----- [facility] Koh try to get her approved for orthovisc. X-ray confirmed away with failure of injections and NSAIDs. Left knee only. -T

## 2014-08-10 NOTE — Telephone Encounter (Signed)
Submitted information to [facility] for approval.

## 2014-08-10 NOTE — Assessment & Plan Note (Signed)
Doing well after initial injection with resolution of mechanical symptoms, did go hiking, had some recurrence of swelling and pain, aspiration and injection today. OA reaction brace given today. Return in 4 weeks, Orthovisc if no better. We are going to get working on the approval process.

## 2014-08-11 NOTE — Telephone Encounter (Signed)
Upon review of benefit information, it states Dr. Dianah Field is considered out of state. That is incorrect. Spoke with drug rep Autumn Patty and spoke with Jenny Reichmann at [employer]. Case was sent for review. Received call from [facility] who stated she is going to reprocess this claim for accurate benefit information.

## 2014-08-11 NOTE — Telephone Encounter (Signed)
Attempted to contact Pt, received information from [place]. The injection code (20610) will be covered at 70% once deductible is met but the injections must be obtained from the speciality pharmacy. Unsure as to what cost that would be for the Pt.

## 2014-08-11 NOTE — Telephone Encounter (Signed)
Received new faxed information regarding OrthoVisc. Rx must still be sent to speciality pharmacy but injection cost (20610) is covered at 100% when administered in office setting.   Pre-Cert is required ([phone]). Reference #: [id number].  Will wait until Pt returns clinic call to begin pre-cert and send Rx to speciality pharmacy.

## 2014-08-12 NOTE — Telephone Encounter (Signed)
Spoke with Pt, would like to proceed with injection authorization.   Called pre-certification line [phone] and spoke with Neomia Dear. The claim did not allow a verbal pre-cert, so form will be faxed to office for completion.

## 2014-08-13 NOTE — Telephone Encounter (Signed)
Received authorization for injections. Called Pharmacy, ordered Rx. Will ship the injections to our office.

## 2014-08-13 NOTE — Telephone Encounter (Signed)
Prior Auth paperwork filled out and faxed back with clinicals.

## 2014-08-14 NOTE — Telephone Encounter (Signed)
Spoke with Ronalee Belts at the [facility] was authorized to be filled. Pt's OOP cost is $0. Called Pt this am to inform Rx will be filled and mailed to our office. Patient verbalized understanding, no further questions.

## 2014-08-18 ENCOUNTER — Other Ambulatory Visit: Payer: Self-pay | Admitting: Sports Medicine

## 2014-08-18 MED ORDER — HYALURONAN 30 MG/2ML IX SOSY: PREFILLED_SYRINGE | INTRA_ARTICULAR | Status: DC

## 2014-09-08 ENCOUNTER — Encounter: Payer: Self-pay | Admitting: Sports Medicine

## 2014-09-08 ENCOUNTER — Ambulatory Visit (INDEPENDENT_AMBULATORY_CARE_PROVIDER_SITE_OTHER): Payer: PRIVATE HEALTH INSURANCE | Admitting: Sports Medicine

## 2014-09-08 VITALS — BP 118/77 | HR 71 | Ht 63.0 in | Wt 168.0 lb

## 2014-09-08 DIAGNOSIS — M1712 Unilateral primary osteoarthritis, left knee: Secondary | ICD-10-CM | POA: Diagnosis not present

## 2014-09-08 NOTE — Telephone Encounter (Signed)
Called speciality pharmacy[facility], spoke with Estill Bamberg. Authorized for shipping of OrthoVisc injections to our office. They should arrive tomorrow (09/09/14).  Patient notified.

## 2014-09-08 NOTE — Progress Notes (Signed)
   Procedure: Real-time Ultrasound Guided Injection of left knee Device: GE Logiq E  Verbal informed consent obtained.  Time-out conducted.  Noted no overlying erythema, induration, or other signs of local infection.  Skin prepped in a sterile fashion.  Local anesthesia: Topical Ethyl chloride.  With sterile technique and under real time ultrasound guidance:   Aspirated 14 mL of straw-colored fluid, syringe switched and 30 mg/2 mL of OrthoVisc (sodium hyaluronate) in a prefilled syringe was injected easily into the knee through a 22-gauge needle. Completed without difficulty  Pain immediately resolved suggesting accurate placement of the medication.  Advised to call if fevers/chills, erythema, induration, drainage, or persistent bleeding.  Images permanently stored and available for review in the ultrasound unit.  Impression: Technically successful ultrasound guided injection.

## 2014-09-08 NOTE — Assessment & Plan Note (Addendum)
Pain is 70% decreased since injection month ago, we did start Orthovisc injections today. Return in one week for Orthovisc injection 2. We were supposed to get an order of Orthovisc in the mail, this is not a right, I used some of our supply, but did not bill for it

## 2014-09-09 NOTE — Progress Notes (Signed)
This has been taken care of. Rx was overnighted and will arrive today to clinic. Pt aware.

## 2014-09-16 ENCOUNTER — Ambulatory Visit (INDEPENDENT_AMBULATORY_CARE_PROVIDER_SITE_OTHER): Payer: PRIVATE HEALTH INSURANCE | Admitting: Sports Medicine

## 2014-09-16 ENCOUNTER — Encounter: Payer: Self-pay | Admitting: Sports Medicine

## 2014-09-16 VITALS — BP 119/76 | HR 65 | Ht 63.0 in | Wt 167.0 lb

## 2014-09-16 DIAGNOSIS — M1712 Unilateral primary osteoarthritis, left knee: Secondary | ICD-10-CM

## 2014-09-16 NOTE — Assessment & Plan Note (Signed)
Orthovisc injection # 2 of 4. Return in one week for #3, doing very well.

## 2014-09-16 NOTE — Progress Notes (Signed)
  Procedure: Real-time Ultrasound Guided Injection of left knee Device: GE Logiq E  Verbal informed consent obtained.  Time-out conducted.  Noted no overlying erythema, induration, or other signs of local infection.  Skin prepped in a sterile fashion.  Local anesthesia: Topical Ethyl chloride.  With sterile technique and under real time ultrasound guidance: 15 mL of straw-colored fluid aspirated, syringe switched and 30 mg/2 mL of OrthoVisc (sodium hyaluronate) in a prefilled syringe was injected easily into the knee through a 22-gauge needle.  Completed without difficulty  Pain immediately resolved suggesting accurate placement of the medication.  Advised to call if fevers/chills, erythema, induration, drainage, or persistent bleeding.  Images permanently stored and available for review in the ultrasound unit.  Impression: Technically successful ultrasound guided injection.

## 2014-09-17 ENCOUNTER — Ambulatory Visit: Payer: PRIVATE HEALTH INSURANCE | Admitting: Sports Medicine

## 2014-09-25 ENCOUNTER — Encounter: Payer: Self-pay | Admitting: Sports Medicine

## 2014-09-25 ENCOUNTER — Ambulatory Visit (INDEPENDENT_AMBULATORY_CARE_PROVIDER_SITE_OTHER): Payer: PRIVATE HEALTH INSURANCE | Admitting: Sports Medicine

## 2014-09-25 VITALS — BP 124/81 | HR 74 | Ht 63.0 in | Wt 166.0 lb

## 2014-09-25 DIAGNOSIS — M1712 Unilateral primary osteoarthritis, left knee: Secondary | ICD-10-CM

## 2014-09-25 NOTE — Assessment & Plan Note (Signed)
Orthovisc injection #3 of 4 today.  Return in one week for #4 of 4.

## 2014-09-25 NOTE — Progress Notes (Signed)
  Procedure: Real-time Ultrasound Guided Injection of left knee Device: GE Logiq E  Verbal informed consent obtained.  Time-out conducted.  Noted no overlying erythema, induration, or other signs of local infection.  Skin prepped in a sterile fashion.  Local anesthesia: Topical Ethyl chloride.  With sterile technique and under real time ultrasound guidance: 15 mL of straw-colored fluid aspirated, syringe switched and 30 mg/2 mL of OrthoVisc (sodium hyaluronate) in a prefilled syringe was injected easily into the knee through a 22-gauge needle.  Completed without difficulty  Pain immediately resolved suggesting accurate placement of the medication.  Advised to call if fevers/chills, erythema, induration, drainage, or persistent bleeding.  Images permanently stored and available for review in the ultrasound unit.  Impression: Technically successful ultrasound guided injection.

## 2014-10-02 ENCOUNTER — Ambulatory Visit (INDEPENDENT_AMBULATORY_CARE_PROVIDER_SITE_OTHER): Payer: PRIVATE HEALTH INSURANCE | Admitting: Sports Medicine

## 2014-10-02 ENCOUNTER — Encounter: Payer: Self-pay | Admitting: Sports Medicine

## 2014-10-02 VITALS — BP 117/78 | HR 59 | Ht 63.0 in | Wt 165.0 lb

## 2014-10-02 DIAGNOSIS — M1712 Unilateral primary osteoarthritis, left knee: Secondary | ICD-10-CM | POA: Diagnosis not present

## 2014-10-02 MED ORDER — MELOXICAM 15 MG PO TABS
15.0000 mg | ORAL_TABLET | Freq: Every day | ORAL | Status: DC
Start: 2014-10-02 — End: 2016-01-18

## 2014-10-02 NOTE — Progress Notes (Signed)

## 2014-10-02 NOTE — Assessment & Plan Note (Signed)
Orthovisc injection #4 into the left knee. 90% pain free.

## 2014-10-02 NOTE — Addendum Note (Signed)
Addended by: Silverio Decamp on: 10/02/2014 10:28 AM   Modules accepted: Orders

## 2014-11-13 ENCOUNTER — Other Ambulatory Visit: Payer: Self-pay | Admitting: Obstetrics & Gynecology

## 2015-01-14 ENCOUNTER — Encounter: Payer: Self-pay | Admitting: Sports Medicine

## 2015-01-14 ENCOUNTER — Ambulatory Visit (INDEPENDENT_AMBULATORY_CARE_PROVIDER_SITE_OTHER): Payer: PRIVATE HEALTH INSURANCE | Admitting: Sports Medicine

## 2015-01-14 VITALS — BP 128/74 | HR 64 | Wt 162.0 lb

## 2015-01-14 DIAGNOSIS — M25511 Pain in right shoulder: Secondary | ICD-10-CM

## 2015-01-14 DIAGNOSIS — M7552 Bursitis of left shoulder: Secondary | ICD-10-CM

## 2015-01-14 DIAGNOSIS — M7551 Bursitis of right shoulder: Secondary | ICD-10-CM | POA: Insufficient documentation

## 2015-01-14 NOTE — Progress Notes (Signed)
  Subjective:    CC: Right shoulder pain  HPI: [name] is a pleasant 64 year old female, we treated her knee osteoarthritis and finished a course of this was supplementation with Orthovisc and she is feeling significantly better, unfortunately a couple weeks ago she was hiking, fell, and had immediate pain in her right shoulder in the anterior aspect, moderate, persistent. She has been doing her rotator cuff rehabilitation exercises and using meloxicam without significant improvement in her pain, and desires aggressive interventional treatment today.  Past medical history, Surgical history, Family history not pertinant except as noted below, Social history, Allergies, and medications have been entered into the medical record, reviewed, and no changes needed.   Review of Systems: No fevers, chills, night sweats, weight loss, chest pain, or shortness of breath.   Objective:    General: Well Developed, well nourished, and in no acute distress.  Neuro: Alert and oriented x3, extra-ocular muscles intact, sensation grossly intact.  HEENT: Normocephalic, atraumatic, pupils equal round reactive to light, neck supple, no masses, no lymphadenopathy, thyroid nonpalpable.  Skin: Warm and dry, no rashes. Cardiac: Regular rate and rhythm, no murmurs rubs or gallops, no lower extremity edema.  Respiratory: Clear to auscultation bilaterally. Not using accessory muscles, speaking in full sentences. Right Shoulder: Inspection reveals no abnormalities, atrophy or asymmetry. Palpation is normal with no tenderness over AC joint or bicipital groove. ROM is full in all planes. Rotator cuff strength weak to internal rotation with a positive lift off test No signs of impingement with negative Neer and Hawkin's tests, empty can. Speeds and Yergason's tests normal. No labral pathology noted with negative Obrien's, negative crank, negative clunk, and good stability. Normal scapular function observed. No painful arc  and no drop arm sign. No apprehension sign  Procedure: Real-time Ultrasound Guided Injection of right subcoracoid bursa Device: GE Logiq E  Verbal informed consent obtained.  Time-out conducted.  Noted no overlying erythema, induration, or other signs of local infection.  Skin prepped in a sterile fashion.  Local anesthesia: Topical Ethyl chloride.  With sterile technique and under real time ultrasound guidance:  25-gauge needle advanced over the subscapularis, into a visibly distended subcoracoid space, 1 mL kenalog 40, 3 mL lidocaine injected easily. Completed without difficulty  Pain immediately resolved suggesting accurate placement of the medication.  Advised to call if fevers/chills, erythema, induration, drainage, or persistent bleeding.  Images permanently stored and available for review in the ultrasound unit.  Impression: Technically successful ultrasound guided injection.  Impression and Recommendations:

## 2015-01-14 NOTE — Assessment & Plan Note (Signed)
Clinically represents subscapularis strain. Subcoracoid space injection, continue with home rehabilitation exercises, meloxicam, return in one.

## 2015-02-22 ENCOUNTER — Ambulatory Visit (INDEPENDENT_AMBULATORY_CARE_PROVIDER_SITE_OTHER): Payer: PRIVATE HEALTH INSURANCE | Admitting: Sports Medicine

## 2015-02-22 ENCOUNTER — Encounter: Payer: Self-pay | Admitting: Sports Medicine

## 2015-02-22 VITALS — BP 128/78 | HR 61 | Wt 162.0 lb

## 2015-02-22 DIAGNOSIS — M25511 Pain in right shoulder: Secondary | ICD-10-CM

## 2015-02-22 NOTE — Assessment & Plan Note (Signed)
Pain completed resolved after subcoracoid space injection at the last visit, return as needed.

## 2015-02-22 NOTE — Progress Notes (Signed)
  Subjective:    CC: Follow-up  HPI: Right shoulder pain: Diagnosed subscapularis strain, injected the subscapularis bursa/subcoracoid space at the last visit and she returns today completely pain-free.  Past medical history, Surgical history, Family history not pertinant except as noted below, Social history, Allergies, and medications have been entered into the medical record, reviewed, and no changes needed.   Review of Systems: No fevers, chills, night sweats, weight loss, chest pain, or shortness of breath.   Objective:    General: Well Developed, well nourished, and in no acute distress.  Neuro: Alert and oriented x3, extra-ocular muscles intact, sensation grossly intact.  HEENT: Normocephalic, atraumatic, pupils equal round reactive to light, neck supple, no masses, no lymphadenopathy, thyroid nonpalpable.  Skin: Warm and dry, no rashes. Cardiac: Regular rate and rhythm, no murmurs rubs or gallops, no lower extremity edema.  Respiratory: Clear to auscultation bilaterally. Not using accessory muscles, speaking in full sentences. Right Shoulder: Inspection reveals no abnormalities, atrophy or asymmetry. Palpation is normal with no tenderness over AC joint or bicipital groove. ROM is full in all planes. Rotator cuff strength normal throughout. No signs of impingement with negative Neer and Hawkin's tests, empty can. Speeds and Yergason's tests normal. No labral pathology noted with negative Obrien's, negative crank, negative clunk, and good stability. Normal scapular function observed. No painful arc and no drop arm sign. No apprehension sign  Impression and Recommendations:

## 2015-03-09 IMAGING — US US TRANSVAGINAL NON-OB
1 series · 13 of 25 positions shown · non-contrast
Comparison: 02/24/2013

CLINICAL DATA: Check thickness of endometrium.

EXAM:
TRANSABDOMINAL AND TRANSVAGINAL ULTRASOUND OF PELVIS
TECHNIQUE: Both transabdominal and transvaginal ultrasound examinations of the
pelvis were performed. Transabdominal technique was performed for
global imaging of the pelvis including uterus, ovaries, adnexal
regions, and pelvic cul-de-sac. It was necessary to proceed with
endovaginal exam following the transabdominal exam to visualize the
uterus, endometrium, ovaries and adnexa .

[Series 1: us transvaginal non-ob · 0.26mm/px · 13 of 78 slices shown]
[im 1/78]
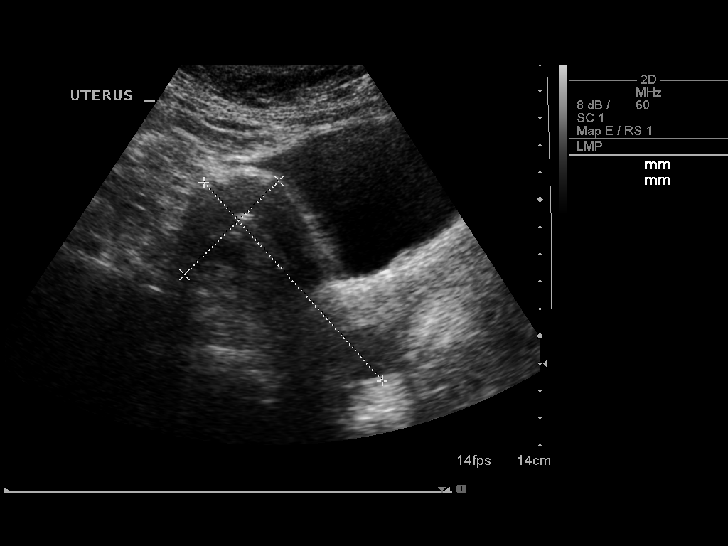
[im 7/78]
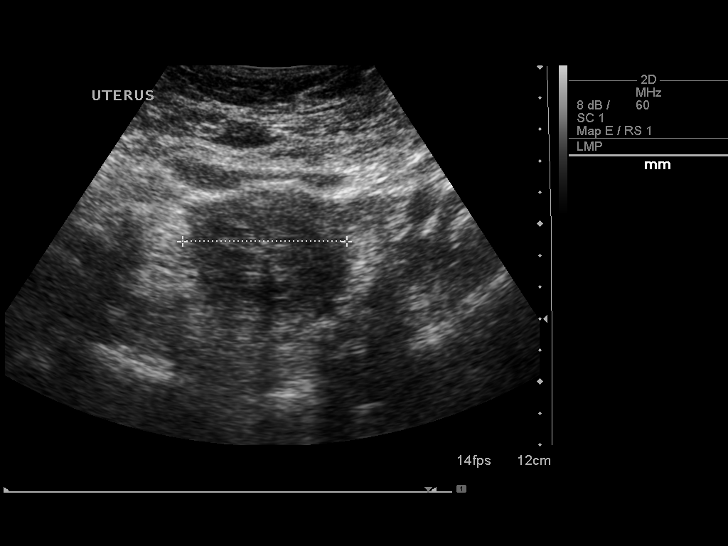
[im 13/78]
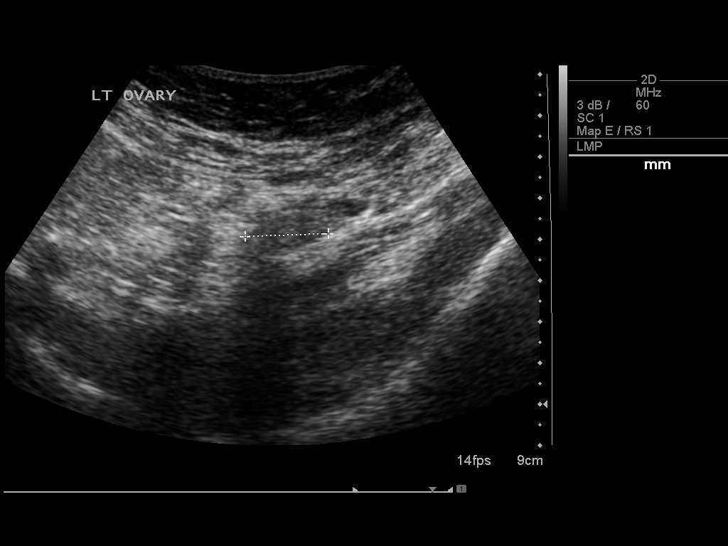
[im 20/78]
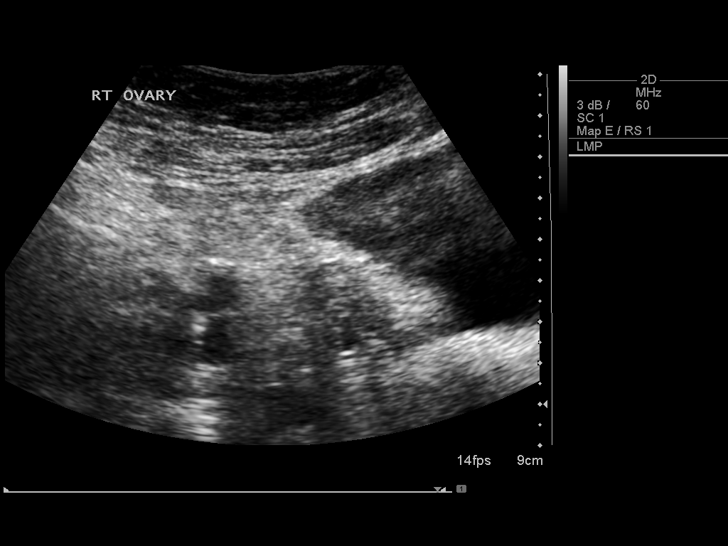
[im 26/78]
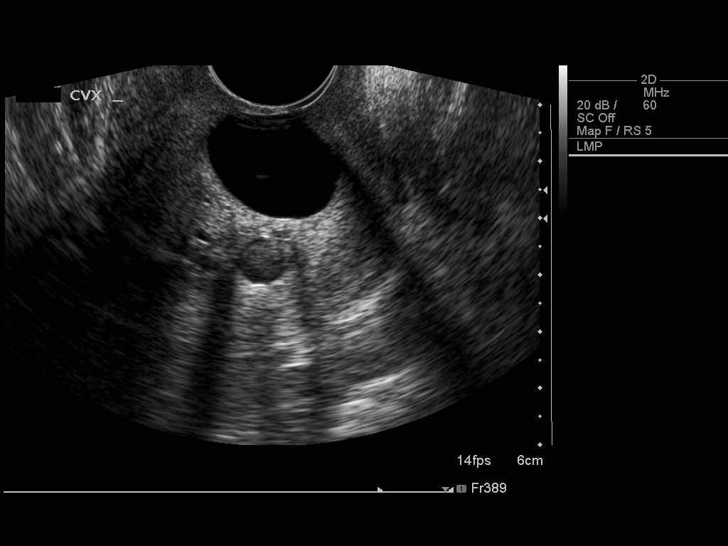
[im 33/78]
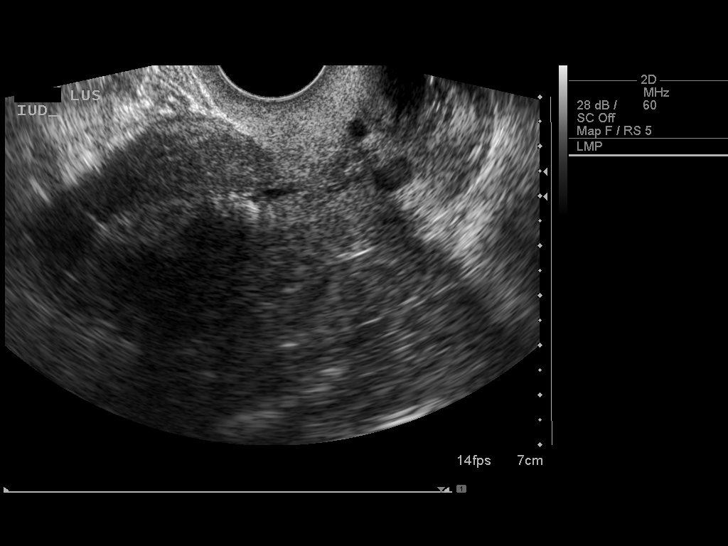
[im 39/78]
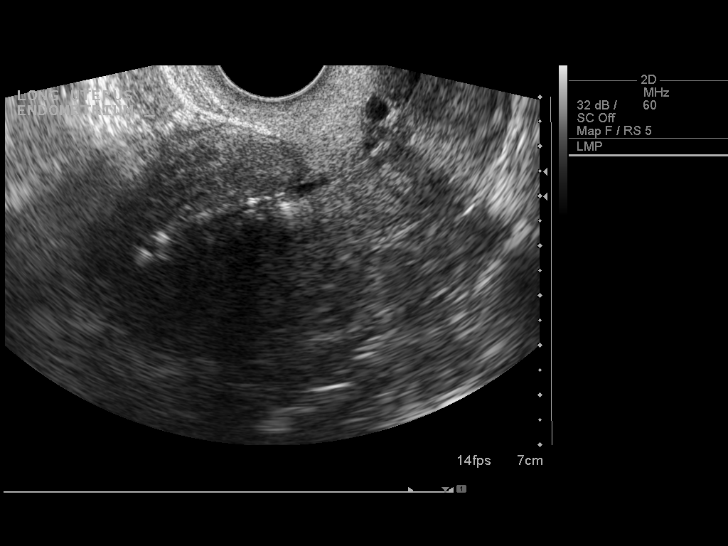
[im 45/78]
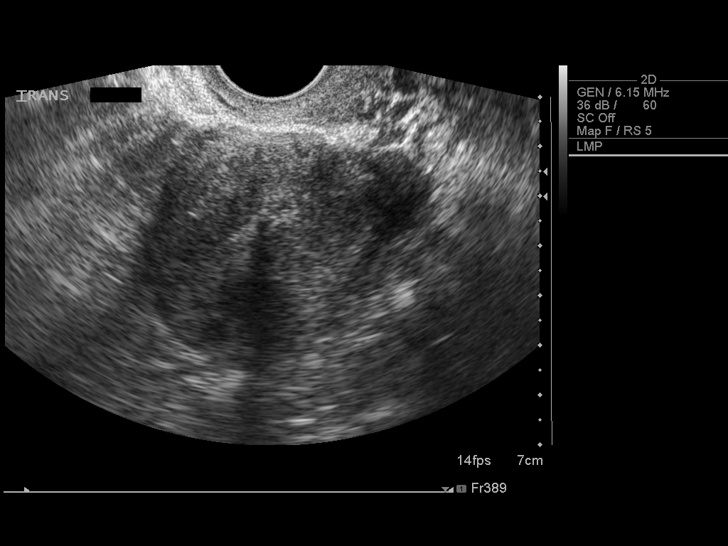
[im 52/78]
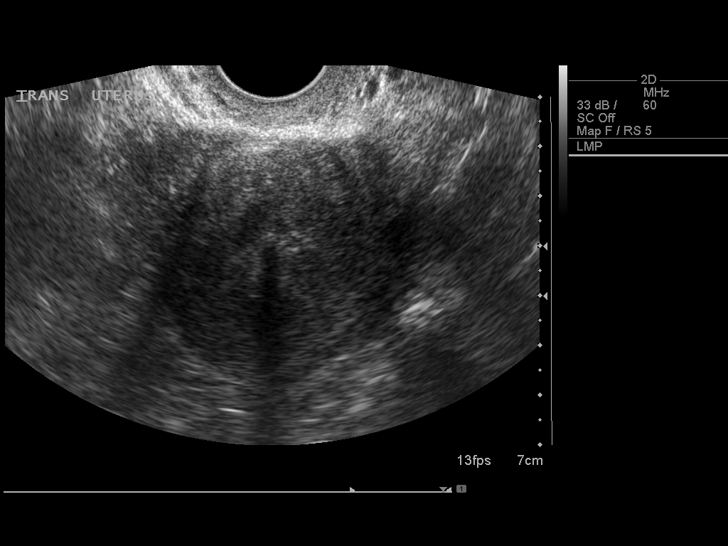
[im 58/78]
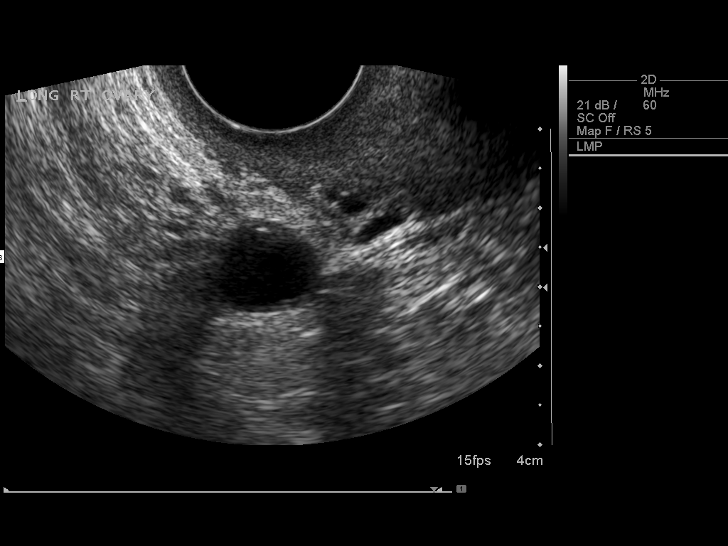
[im 65/78]
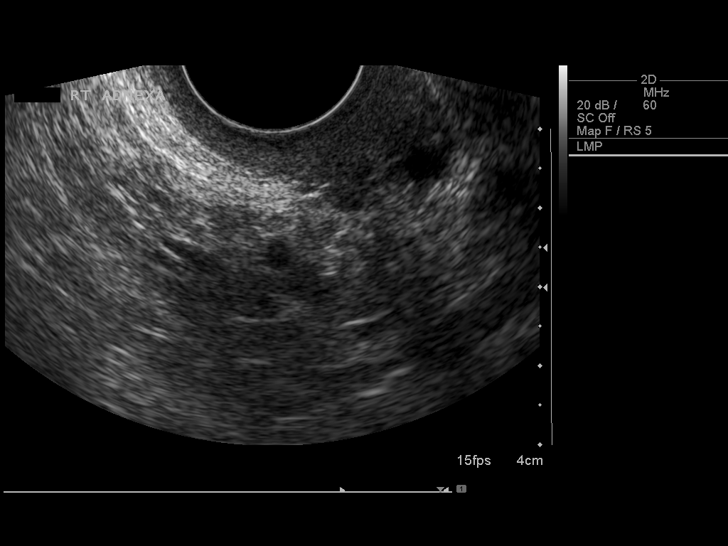
[im 71/78]
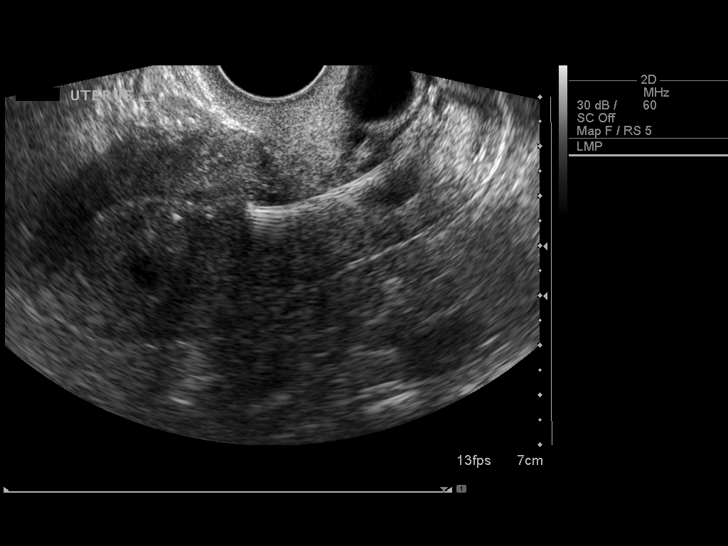
[im 78/78]
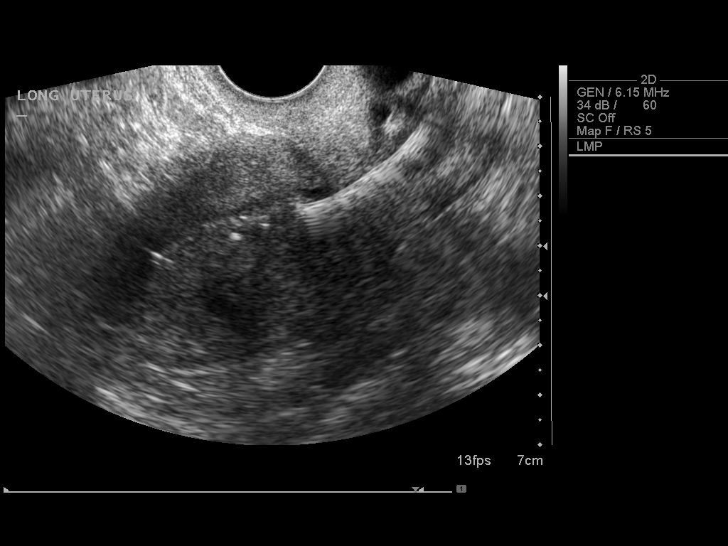

[13 of 25 positions shown; findings below may reference images not displayed]

FINDINGS: Uterus

Measurements: 9.8 x 4.9 x 5.2 cm. Large nabothian cysts again noted.
Possible small posterior fibroid measuring up to 1.3 cm. This is
better seen on today's study..

Endometrium

Thickness: IUD noted within the endometrium in the lower uterine
segment. Scattered endometrial calcifications. Endometrium 10 mm in
thickness.. No focal abnormality visualized.

Right ovary

Measurements: 2.4 x 1.3 x 2.0 cm. 1.2 cm cyst noted.

Left ovary

Measurements: 2.2 x 1.4 x 2.0 cm. No focal abnormality. No adnexal
masses.

Other findings

No free fluid.
IMPRESSION: Stable position of the IUD in the lower uterine segment.

Endometrial calcifications again noted with endometrium measuring up
to 10 mm. Endometrial thickness is considered abnormal for an
asymptomatic post-menopausal female. Endometrial sampling should be
considered to exclude carcinoma.

## 2015-04-26 ENCOUNTER — Other Ambulatory Visit: Payer: Self-pay

## 2015-04-26 DIAGNOSIS — Z1231 Encounter for screening mammogram for malignant neoplasm of breast: Secondary | ICD-10-CM

## 2015-05-26 ENCOUNTER — Ambulatory Visit (INDEPENDENT_AMBULATORY_CARE_PROVIDER_SITE_OTHER): Payer: PRIVATE HEALTH INSURANCE

## 2015-05-26 ENCOUNTER — Encounter: Payer: Self-pay | Admitting: Sports Medicine

## 2015-05-26 ENCOUNTER — Ambulatory Visit (INDEPENDENT_AMBULATORY_CARE_PROVIDER_SITE_OTHER): Payer: PRIVATE HEALTH INSURANCE | Admitting: Sports Medicine

## 2015-05-26 VITALS — BP 148/86 | HR 65 | Resp 18 | Ht 63.0 in | Wt 161.5 lb

## 2015-05-26 DIAGNOSIS — M5412 Radiculopathy, cervical region: Secondary | ICD-10-CM | POA: Diagnosis not present

## 2015-05-26 DIAGNOSIS — Z0000 Encounter for general adult medical examination without abnormal findings: Secondary | ICD-10-CM

## 2015-05-26 DIAGNOSIS — I10 Essential (primary) hypertension: Secondary | ICD-10-CM | POA: Diagnosis not present

## 2015-05-26 DIAGNOSIS — M25511 Pain in right shoulder: Secondary | ICD-10-CM | POA: Diagnosis not present

## 2015-05-26 DIAGNOSIS — L821 Other seborrheic keratosis: Secondary | ICD-10-CM

## 2015-05-26 DIAGNOSIS — M50322 Other cervical disc degeneration at C5-C6 level: Secondary | ICD-10-CM | POA: Diagnosis not present

## 2015-05-26 MED ORDER — SPIRONOLACTONE 25 MG PO TABS: 25.0000 mg | ORAL_TABLET | Freq: Two times a day (BID) | ORAL | Status: DC

## 2015-05-26 MED ORDER — PRAVASTATIN SODIUM 20 MG PO TABS: 20.0000 mg | ORAL_TABLET | Freq: Every day | ORAL | Status: DC

## 2015-05-26 MED ORDER — PREDNISONE 20 MG PO TABS: 20.0000 mg | ORAL_TABLET | Freq: Every day | ORAL | Status: DC

## 2015-05-26 NOTE — Assessment & Plan Note (Signed)
Fantastic response to subcoracoid injection 5 months ago, we are going to's prednisone for her neck and if no improvement in one month we will repeat a right subcoracoid space injection.

## 2015-05-26 NOTE — Assessment & Plan Note (Signed)
X-rays, low-dose prednisone,physical therapy.  Return in one month, MRI for interventional planning if no better. Distribution is right C6

## 2015-05-26 NOTE — Assessment & Plan Note (Addendum)
Complete physical as above, routine blood work ordered.  She is due for colon cancer screening, we will proceed with Cologuard

## 2015-05-26 NOTE — Assessment & Plan Note (Signed)
Right-sided chest, cryotherapy as above.

## 2015-05-26 NOTE — Progress Notes (Signed)
  Subjective:    CC:  Complete physical  HPI:  Preventive measures: Due for colon cancer screening, up-to-date on influenza and tetanus vaccination.  Hyperlipidemia: Due for a recheck  Right cervical radiculopathy: Present for the past several months, neck pain with occasional numbness and tingling down the right arm in a C5 and C6 distribution.  Right shoulder pain: Historically has represented subcoracoid bursitis and responded well to a subcoracoid injection in the past, minimally present today.  Skin lesion: Localized over the right upper chest. Present for years and not changing much.  Past medical history, Surgical history, Family history not pertinant except as noted below, Social history, Allergies, and medications have been entered into the medical record, reviewed, and no changes needed.   Review of Systems: No headache, visual changes, nausea, vomiting, diarrhea, constipation, dizziness, abdominal pain, skin rash, fevers, chills, night sweats, swollen lymph nodes, weight loss, chest pain, body aches, joint swelling, muscle aches, shortness of breath, mood changes, visual or auditory hallucinations.  Objective:    General: Well Developed, well nourished, and in no acute distress.  Neuro: Alert and oriented x3, extra-ocular muscles intact, sensation grossly intact.  HEENT: Normocephalic, atraumatic, pupils equal round reactive to light, neck supple, no masses, no lymphadenopathy, thyroid nonpalpable.  Skin: Warm and dry, no rashes noted.  There is a  2 cm seborrheic keratosis above the right breast Cardiac: Regular rate and rhythm, no murmurs rubs or gallops.  Respiratory: Clear to auscultation bilaterally. Not using accessory muscles, speaking in full sentences.  Abdominal: Soft, nontender, nondistended, positive bowel sounds, no masses, no organomegaly.  Musculoskeletal: Shoulder, elbow, wrist, hip, knee, ankle stable, and with full range of motion.  Procedure:   Cryodestruction of right chest seborrheic keratosis Consent obtained and verified. Time-out conducted. Noted no overlying erythema, induration, or other signs of local infection. Completed without difficulty using Cryo-Gun. Advised to call if fevers/chills, erythema, induration, drainage, or persistent bleeding.  Impression and Recommendations:    The patient was counselled, risk factors were discussed, anticipatory guidance given.

## 2015-05-27 ENCOUNTER — Encounter: Payer: Self-pay | Admitting: Sports Medicine

## 2015-05-28 LAB — TSH: TSH: 0.74 mIU/L

## 2015-05-29 LAB — COMPREHENSIVE METABOLIC PANEL
AST: 18 U/L (ref 10–35)
Albumin: 4.2 g/dL (ref 3.6–5.1)
Alkaline Phosphatase: 50 U/L (ref 33–130)
CO2: 25 mmol/L (ref 20–31)
Calcium: 9.8 mg/dL (ref 8.6–10.4)
Chloride: 103 mmol/L (ref 98–110)
Creat: 0.9 mg/dL (ref 0.50–0.99)
Potassium: 4.2 mmol/L (ref 3.5–5.3)
Sodium: 140 mmol/L (ref 135–146)

## 2015-05-29 LAB — CBC
HCT: 44.1 % (ref 36.0–46.0)
Hemoglobin: 15 g/dL (ref 12.0–15.0)
MCH: 32.8 pg (ref 26.0–34.0)
MCHC: 34 g/dL (ref 30.0–36.0)
MCV: 96.5 fL (ref 78.0–100.0)
MPV: 9.7 fL (ref 8.6–12.4)
Platelets: 251 10*3/uL (ref 150–400)
RBC: 4.57 MIL/uL (ref 3.87–5.11)
RDW: 12.5 % (ref 11.5–15.5)
WBC: 7.7 10*3/uL (ref 4.0–10.5)

## 2015-05-29 LAB — LIPID PANEL
Cholesterol: 157 mg/dL (ref 125–200)
HDL: 54 mg/dL (ref >=46)
LDL Cholesterol: 87 mg/dL (ref <130)
Total CHOL/HDL Ratio: 2.9 Ratio (ref <=5.0)
Triglycerides: 81 mg/dL (ref <150)
VLDL: 16 mg/dL (ref <30)

## 2015-05-29 LAB — COMPREHENSIVE METABOLIC PANEL WITH GFR
ALT: 19 U/L (ref 6–29)
BUN: 13 mg/dL (ref 7–25)
Glucose, Bld: 86 mg/dL (ref 65–99)
Total Bilirubin: 0.9 mg/dL (ref 0.2–1.2)
Total Protein: 6.6 g/dL (ref 6.1–8.1)

## 2015-05-29 LAB — HEMOGLOBIN A1C
Hgb A1c MFr Bld: 5.1 % (ref <5.7)
Mean Plasma Glucose: 100 mg/dL (ref <117)

## 2015-05-29 LAB — VITAMIN D 25 HYDROXY (VIT D DEFICIENCY, FRACTURES): Vit D, 25-Hydroxy: 57 ng/mL (ref 30–100)

## 2015-06-14 ENCOUNTER — Ambulatory Visit: Payer: PRIVATE HEALTH INSURANCE | Admitting: Sports Medicine

## 2015-06-21 ENCOUNTER — Ambulatory Visit
Admission: RE | Admit: 2015-06-21 | Discharge: 2015-06-21 | Disposition: A | Discharge status: Home / Self Care | Payer: PRIVATE HEALTH INSURANCE | Source: Ambulatory Visit

## 2015-06-21 DIAGNOSIS — Z1231 Encounter for screening mammogram for malignant neoplasm of breast: Secondary | ICD-10-CM

## 2015-07-09 ENCOUNTER — Ambulatory Visit (INDEPENDENT_AMBULATORY_CARE_PROVIDER_SITE_OTHER): Payer: PRIVATE HEALTH INSURANCE | Admitting: Sports Medicine

## 2015-07-09 VITALS — BP 117/75 | HR 70 | Resp 16 | Wt 161.0 lb

## 2015-07-09 DIAGNOSIS — M5412 Radiculopathy, cervical region: Secondary | ICD-10-CM | POA: Diagnosis not present

## 2015-07-09 DIAGNOSIS — M25511 Pain in right shoulder: Secondary | ICD-10-CM | POA: Diagnosis not present

## 2015-07-09 NOTE — Assessment & Plan Note (Signed)
Six-month response to subcoracoid space injection, repeated today.

## 2015-07-09 NOTE — Addendum Note (Signed)
Addended by: Elizabeth Sauer on: 07/09/2015 04:14 PM   Modules accepted: Orders, Medications

## 2015-07-09 NOTE — Progress Notes (Signed)
  Subjective:    CC: right shoulder pain  HPI: Right cervical radiculopathy: Resolved with physical therapy and needling.  Right subcoracoid bursitis: Six-month response to prior injection, desires repeat today.  Past medical history, Surgical history, Family history not pertinant except as noted below, Social history, Allergies, and medications have been entered into the medical record, reviewed, and no changes needed.   Review of Systems: No fevers, chills, night sweats, weight loss, chest pain, or shortness of breath.   Objective:    General: Well Developed, well nourished, and in no acute distress.  Neuro: Alert and oriented x3, extra-ocular muscles intact, sensation grossly intact.  HEENT: Normocephalic, atraumatic, pupils equal round reactive to light, neck supple, no masses, no lymphadenopathy, thyroid nonpalpable.  Skin: Warm and dry, no rashes. Cardiac: Regular rate and rhythm, no murmurs rubs or gallops, no lower extremity edema.  Respiratory: Clear to auscultation bilaterally. Not using accessory muscles, speaking in full sentences.  Procedure: Real-time Ultrasound Guided Injection of Right subcoracoid space Device: GE Logiq E  Verbal informed consent obtained.  Time-out conducted.  Noted no overlying erythema, induration, or other signs of local infection.  Skin prepped in a sterile fashion.  Local anesthesia: Topical Ethyl chloride.  With sterile technique and under real time ultrasound guidance: noted intact supraspinatus and well seated long head of biceps, 25-gauge needle advanced just under the coracoid process skimming over the subscapularis and 1 mL kenalog 40, 1 mL lidocaine, 1 mL Marcaine injected easily.  Completed without difficulty  Pain immediately resolved suggesting accurate placement of the medication.  Advised to call if fevers/chills, erythema, induration, drainage, or persistent bleeding.  Images permanently stored and available for review in the  ultrasound unit.  Impression: Technically successful ultrasound guided injection.  Impression and Recommendations:

## 2015-07-09 NOTE — Assessment & Plan Note (Signed)
Resolved with therapy and prednisone

## 2015-07-16 LAB — COLOGUARD: Cologuard: POSITIVE

## 2015-07-19 ENCOUNTER — Telehealth: Payer: Self-pay | Admitting: Sports Medicine

## 2015-07-19 DIAGNOSIS — Z1211 Encounter for screening for malignant neoplasm of colon: Secondary | ICD-10-CM | POA: Insufficient documentation

## 2015-07-19 NOTE — Telephone Encounter (Signed)
Positive ColoGuard

## 2015-07-19 NOTE — Assessment & Plan Note (Signed)
Positive ColoGuard, referral to colonoscopy.

## 2015-07-20 NOTE — Telephone Encounter (Signed)
Pt notified no questions or concerns at this time.  

## 2015-07-21 ENCOUNTER — Encounter: Payer: Self-pay | Admitting: Sports Medicine

## 2015-08-18 ENCOUNTER — Ambulatory Visit (INDEPENDENT_AMBULATORY_CARE_PROVIDER_SITE_OTHER): Payer: PRIVATE HEALTH INSURANCE | Admitting: Sports Medicine

## 2015-08-18 VITALS — BP 105/71 | HR 63 | Resp 16 | Wt 156.1 lb

## 2015-08-18 DIAGNOSIS — M25511 Pain in right shoulder: Secondary | ICD-10-CM | POA: Diagnosis not present

## 2015-08-18 DIAGNOSIS — M47816 Spondylosis without myelopathy or radiculopathy, lumbar region: Secondary | ICD-10-CM

## 2015-08-18 DIAGNOSIS — M5412 Radiculopathy, cervical region: Secondary | ICD-10-CM

## 2015-08-18 MED ORDER — TIZANIDINE HCL 4 MG PO TABS: 4.0000 mg | ORAL_TABLET | Freq: Four times a day (QID) | ORAL | Status: DC | PRN

## 2015-08-18 NOTE — Progress Notes (Signed)
  Subjective:    CC:  Follow-up  HPI: Shoulder pain: Resolved after subcoracoid space injection.  Lumbar radiculopathy: Desires repeat epidural  Neck pain: Ready to proceed with MRI and epidural. Needs refill on Zanaflex.  Past medical history, Surgical history, Family history not pertinant except as noted below, Social history, Allergies, and medications have been entered into the medical record, reviewed, and no changes needed.   Review of Systems: No fevers, chills, night sweats, weight loss, chest pain, or shortness of breath.   Objective:    General: Well Developed, well nourished, and in no acute distress.  Neuro: Alert and oriented x3, extra-ocular muscles intact, sensation grossly intact.  HEENT: Normocephalic, atraumatic, pupils equal round reactive to light, neck supple, no masses, no lymphadenopathy, thyroid nonpalpable.  Skin: Warm and dry, no rashes. Cardiac: Regular rate and rhythm, no murmurs rubs or gallops, no lower extremity edema.  Respiratory: Clear to auscultation bilaterally. Not using accessory muscles, speaking in full sentences.  Impression and Recommendations:   I spent 25 minutes with this patient, greater than 50% was face-to-face time counseling regarding the above diagnoses

## 2015-08-18 NOTE — Assessment & Plan Note (Signed)
Repeat L5-S1 transforaminal epidural

## 2015-08-18 NOTE — Assessment & Plan Note (Signed)
Ordering cervical spine MRI.

## 2015-08-18 NOTE — Assessment & Plan Note (Signed)
Resolved with subcoracoid space injection.

## 2015-08-19 ENCOUNTER — Telehealth: Payer: Self-pay

## 2015-08-19 DIAGNOSIS — M47816 Spondylosis without myelopathy or radiculopathy, lumbar region: Secondary | ICD-10-CM

## 2015-08-19 NOTE — Addendum Note (Signed)
Addended by: Silverio Decamp on: 08/19/2015 04:51 PM   Modules accepted: Orders

## 2015-08-19 NOTE — Telephone Encounter (Signed)
All done

## 2015-08-19 NOTE — Telephone Encounter (Signed)
Epidural order isn't correct. Please assist.

## 2015-08-24 ENCOUNTER — Ambulatory Visit: Payer: PRIVATE HEALTH INSURANCE | Admitting: Sports Medicine

## 2015-08-25 ENCOUNTER — Ambulatory Visit
Admission: RE | Admit: 2015-08-25 | Discharge: 2015-08-25 | Disposition: A | Discharge status: Home / Self Care | Payer: PRIVATE HEALTH INSURANCE | Source: Ambulatory Visit | Attending: Sports Medicine | Admitting: Sports Medicine

## 2015-08-25 DIAGNOSIS — M47816 Spondylosis without myelopathy or radiculopathy, lumbar region: Secondary | ICD-10-CM

## 2015-08-25 MED ORDER — IOPAMIDOL (ISOVUE-M 200) INJECTION 41%
1.0000 mL | Freq: Once | INTRAMUSCULAR | Status: AC
  Administered 2015-08-25: 1 mL via EPIDURAL

## 2015-08-25 MED ORDER — METHYLPREDNISOLONE ACETATE 40 MG/ML INJ SUSP (RADIOLOG
120.0000 mg | Freq: Once | INTRAMUSCULAR | Status: AC
Start: 2015-08-25 — End: 2015-08-25
  Administered 2015-08-25: 120 mg via EPIDURAL

## 2015-09-13 ENCOUNTER — Ambulatory Visit
Admission: RE | Admit: 2015-09-13 | Discharge: 2015-09-13 | Disposition: A | Discharge status: Home / Self Care | Payer: PRIVATE HEALTH INSURANCE | Source: Ambulatory Visit | Attending: Sports Medicine | Admitting: Sports Medicine

## 2015-09-13 DIAGNOSIS — M5412 Radiculopathy, cervical region: Secondary | ICD-10-CM

## 2015-11-14 ENCOUNTER — Encounter: Payer: Self-pay | Admitting: Sports Medicine

## 2015-11-15 NOTE — Telephone Encounter (Signed)
Good response to Orthovisc one year ago, repeat series for the left knee, need approval.

## 2015-11-15 NOTE — Telephone Encounter (Signed)
Submitted for approval on Orthovisc. Awaiting confirmation.  

## 2015-11-16 NOTE — Telephone Encounter (Signed)
Received the following information from [employer] benefits investigation:  This is a PPO Plan. The effective date is 09/17/08. X5072- is covered at 70% of the contracted rate when administered in an office setting & 20610 is covered at 70% of the contracted rate when performed in an office setting After deductible has been met! Pre-cert is required for J7324 and may be obtained by calling [phone]. The reference number is [id number]   Called Pt and left VM to return clinic call regarding estimated OOP cost. Pre-cert has not been initiated at this time.

## 2015-11-16 NOTE — Telephone Encounter (Signed)
[facility], spoke with Vaughan Basta in the specialty drug department- no authorization required. Call ref #: [id number].  Attempted to contact Pt to schedule, left VM to return clinic call.

## 2015-11-16 NOTE — Telephone Encounter (Signed)
Pt advised of estimated OOP cost, would like to continue with getting injections. Will obtain prior auth then schedule appts.

## 2015-12-03 ENCOUNTER — Encounter: Payer: Self-pay | Admitting: Sports Medicine

## 2015-12-03 ENCOUNTER — Ambulatory Visit (INDEPENDENT_AMBULATORY_CARE_PROVIDER_SITE_OTHER): Payer: PRIVATE HEALTH INSURANCE | Admitting: Sports Medicine

## 2015-12-03 DIAGNOSIS — M1712 Unilateral primary osteoarthritis, left knee: Secondary | ICD-10-CM | POA: Diagnosis not present

## 2015-12-03 NOTE — Assessment & Plan Note (Signed)
15 month response to previous Orthovisc series, repeat today, injection #1 of 4, return in one week for #2.

## 2015-12-03 NOTE — Progress Notes (Signed)

## 2015-12-06 ENCOUNTER — Encounter: Payer: Self-pay | Admitting: Sports Medicine

## 2015-12-10 ENCOUNTER — Encounter: Payer: Self-pay | Admitting: Sports Medicine

## 2015-12-10 ENCOUNTER — Ambulatory Visit (INDEPENDENT_AMBULATORY_CARE_PROVIDER_SITE_OTHER): Payer: PRIVATE HEALTH INSURANCE | Admitting: Sports Medicine

## 2015-12-10 DIAGNOSIS — M1712 Unilateral primary osteoarthritis, left knee: Secondary | ICD-10-CM | POA: Diagnosis not present

## 2015-12-10 DIAGNOSIS — M5412 Radiculopathy, cervical region: Secondary | ICD-10-CM

## 2015-12-10 MED ORDER — PREDNISONE 10 MG (48) PO TBPK: ORAL_TABLET | Freq: Every day | ORAL | 0 refills | Status: DC

## 2015-12-10 NOTE — Assessment & Plan Note (Signed)
Multilevel degenerative disc disease, worse at the C5-C6 level. We will try a prednisone Dosepak, if insufficient improvement we will proceed with a right-sided C6-C7 interlaminar epidural.

## 2015-12-10 NOTE — Assessment & Plan Note (Signed)
Orthovisc injection #2 of 4 into the left knee, return in one week for #3 of 4. May restart meloxicam as long she takes an acid blocker with it.

## 2015-12-10 NOTE — Progress Notes (Signed)

## 2015-12-13 ENCOUNTER — Ambulatory Visit: Payer: PRIVATE HEALTH INSURANCE | Admitting: Sports Medicine

## 2015-12-17 ENCOUNTER — Ambulatory Visit (INDEPENDENT_AMBULATORY_CARE_PROVIDER_SITE_OTHER): Payer: PRIVATE HEALTH INSURANCE | Admitting: Sports Medicine

## 2015-12-17 ENCOUNTER — Encounter: Payer: Self-pay | Admitting: Sports Medicine

## 2015-12-17 DIAGNOSIS — M1712 Unilateral primary osteoarthritis, left knee: Secondary | ICD-10-CM

## 2015-12-17 DIAGNOSIS — M5412 Radiculopathy, cervical region: Secondary | ICD-10-CM

## 2015-12-17 NOTE — Assessment & Plan Note (Signed)
These are doing okay, Orthovisc injection #3 of 4 into the left knee. Return in one week for #4.

## 2015-12-17 NOTE — Assessment & Plan Note (Signed)
Nearly resolved with a prednisone Dosepak, she does have C5-C6 degenerative disc disease, if this recurs we can proceed with a right C6-C7 interlaminar epidural.

## 2015-12-17 NOTE — Progress Notes (Signed)

## 2015-12-20 ENCOUNTER — Encounter: Payer: Self-pay | Admitting: Sports Medicine

## 2015-12-20 NOTE — Telephone Encounter (Signed)
Pt has appt scheduled.

## 2015-12-24 ENCOUNTER — Encounter: Payer: Self-pay | Admitting: Sports Medicine

## 2015-12-24 ENCOUNTER — Ambulatory Visit (INDEPENDENT_AMBULATORY_CARE_PROVIDER_SITE_OTHER): Payer: PRIVATE HEALTH INSURANCE | Admitting: Sports Medicine

## 2015-12-24 DIAGNOSIS — M791 Myalgia, unspecified site: Secondary | ICD-10-CM

## 2015-12-24 DIAGNOSIS — M47816 Spondylosis without myelopathy or radiculopathy, lumbar region: Secondary | ICD-10-CM | POA: Diagnosis not present

## 2015-12-24 DIAGNOSIS — M1712 Unilateral primary osteoarthritis, left knee: Secondary | ICD-10-CM

## 2015-12-24 NOTE — Assessment & Plan Note (Signed)
Left SI joint injection under ultrasound guidance as above.

## 2015-12-24 NOTE — Assessment & Plan Note (Signed)
Orthovisc injection #4 of 4 into the left knee. Starting to feel better. Return as needed for this.

## 2015-12-24 NOTE — Progress Notes (Signed)
  Subjective:    CC: SI joint pain and knee injection  HPI: Left knee osteoarthritis: Here for Orthovisc injection #4.  Low back pain: Known SI joint dysfunction, typically gets SI joint injections under fluoroscopy, would like me to do it today under ultrasound guidance, pain is moderate, persistent, localized without radiation.  Past medical history:  Negative.  See flowsheet/record as well for more information.  Surgical history: Negative.  See flowsheet/record as well for more information.  Family history: Negative.  See flowsheet/record as well for more information.  Social history: Negative.  See flowsheet/record as well for more information.  Allergies, and medications have been entered into the medical record, reviewed, and no changes needed.   Review of Systems: No fevers, chills, night sweats, weight loss, chest pain, or shortness of breath.   Objective:    General: Well Developed, well nourished, and in no acute distress.  Neuro: Alert and oriented x3, extra-ocular muscles intact, sensation grossly intact.  HEENT: Normocephalic, atraumatic, pupils equal round reactive to light, neck supple, no masses, no lymphadenopathy, thyroid nonpalpable.  Skin: Warm and dry, no rashes. Cardiac: Regular rate and rhythm, no murmurs rubs or gallops, no lower extremity edema.  Respiratory: Clear to auscultation bilaterally. Not using accessory muscles, speaking in full sentences.  Procedure: Real-time Ultrasound Guided Injection of left knee Device: GE Logiq E  Verbal informed consent obtained.  Time-out conducted.  Noted no overlying erythema, induration, or other signs of local infection.  Skin prepped in a sterile fashion.  Local anesthesia: Topical Ethyl chloride.  With sterile technique and under real time ultrasound guidance:   30 mg/2 mL of OrthoVisc (sodium hyaluronate) in a prefilled syringe was injected easily into the knee through a 22-gauge needle. Completed without difficulty   Pain immediately resolved suggesting accurate placement of the medication.  Advised to call if fevers/chills, erythema, induration, drainage, or persistent bleeding.  Images permanently stored and available for review in the ultrasound unit.  Impression: Technically successful ultrasound guided injection.  Procedure: Real-time Ultrasound Guided Injection of left SI joint Device: GE Logiq E  Verbal informed consent obtained.  Time-out conducted.  Noted no overlying erythema, induration, or other signs of local infection.  Skin prepped in a sterile fashion.  Local anesthesia: Topical Ethyl chloride.  With sterile technique and under real time ultrasound guidance:  22-gauge spinal needle advanced into the SI joint under ultrasound guidance, 1 mL kenalog 40, 4 mL lidocaine injected easily. Completed without difficulty  Pain immediately resolved suggesting accurate placement of the medication.  Advised to call if fevers/chills, erythema, induration, drainage, or persistent bleeding.  Images permanently stored and available for review in the ultrasound unit.  Impression: Technically successful ultrasound guided injection.  Impression and Recommendations:    Lumbar spondylosis Left SI joint injection under ultrasound guidance as above.  Primary osteoarthritis of left knee Orthovisc injection #4 of 4 into the left knee. Starting to feel better. Return as needed for this.

## 2016-01-17 ENCOUNTER — Encounter: Payer: Self-pay | Admitting: Sports Medicine

## 2016-01-17 ENCOUNTER — Ambulatory Visit (INDEPENDENT_AMBULATORY_CARE_PROVIDER_SITE_OTHER): Payer: PRIVATE HEALTH INSURANCE | Admitting: Sports Medicine

## 2016-01-17 DIAGNOSIS — M170 Bilateral primary osteoarthritis of knee: Secondary | ICD-10-CM

## 2016-01-17 NOTE — Assessment & Plan Note (Signed)
Left knee continues to do well after Orthovisc series we just finished. Right knee is swollen, aspiration and injection as above. We are going to get Orthovisc approved, so that we can start it should she need it. Return to see me as needed.

## 2016-01-17 NOTE — Progress Notes (Signed)
  Subjective:    CC: Right knee.  HPI: This is a pleasant 65 year old female with known bilateral knee osteoarthritis, we finished a series of Orthovisc in her left knee earlier this month. She's now having recurrence of pain, swelling but this time on the right side. She did [place]. Pain is moderate, persistent, no mechanical symptoms.  Past medical history:  Negative.  See flowsheet/record as well for more information.  Surgical history: Negative.  See flowsheet/record as well for more information.  Family history: Negative.  See flowsheet/record as well for more information.  Social history: Negative.  See flowsheet/record as well for more information.  Allergies, and medications have been entered into the medical record, reviewed, and no changes needed.   Review of Systems: No fevers, chills, night sweats, weight loss, chest pain, or shortness of breath.   Objective:    General: Well Developed, well nourished, and in no acute distress.  Neuro: Alert and oriented x3, extra-ocular muscles intact, sensation grossly intact.  HEENT: Normocephalic, atraumatic, pupils equal round reactive to light, neck supple, no masses, no lymphadenopathy, thyroid nonpalpable.  Skin: Warm and dry, no rashes. Cardiac: Regular rate and rhythm, no murmurs rubs or gallops, no lower extremity edema.  Respiratory: Clear to auscultation bilaterally. Not using accessory muscles, speaking in full sentences. Right Knee: Visibly swollen with tenderness over the medial joint line, palpable fluid wave ROM normal in flexion and extension and lower leg rotation. Ligaments with solid consistent endpoints including ACL, PCL, LCL, MCL. Negative Mcmurray's and provocative meniscal tests. Non painful patellar compression. Patellar and quadriceps tendons unremarkable. Hamstring and quadriceps strength is normal.  Procedure: Real-time Ultrasound Guided aspiration/injection of right knee Device: GE  Logiq E  Verbal informed consent obtained.  Time-out conducted.  Noted no overlying erythema, induration, or other signs of local infection.  Skin prepped in a sterile fashion.  Local anesthesia: Topical Ethyl chloride.  With sterile technique and under real time ultrasound guidance: Using 18-gauge needle aspirated 10 mL straw-colored fluid, syringe switched and 1 mL kenalog 40, 2 mL lidocaine, 2 mL Marcaine injected easily.  Completed without difficulty  Pain immediately resolved suggesting accurate placement of the medication.  Advised to call if fevers/chills, erythema, induration, drainage, or persistent bleeding.  Images permanently stored and available for review in the ultrasound unit.  Impression: Technically successful ultrasound guided injection.  Impression and Recommendations:    Primary osteoarthritis of both knees Left knee continues to do well after Orthovisc series we just finished. Right knee is swollen, aspiration and injection as above. We are going to get Orthovisc approved, so that we can start it should she need it. Return to see me as needed.

## 2016-01-18 ENCOUNTER — Other Ambulatory Visit: Payer: Self-pay | Admitting: Sports Medicine

## 2016-01-25 ENCOUNTER — Encounter: Payer: Self-pay | Admitting: Sports Medicine

## 2016-02-02 MED ORDER — HYALURONAN 30 MG/2ML IX SOSY: 1.0000 | PREFILLED_SYRINGE | INTRA_ARTICULAR | 0 refills | Status: DC

## 2016-02-04 LAB — HM COLONOSCOPY

## 2016-02-07 ENCOUNTER — Telehealth: Payer: Self-pay | Admitting: Sports Medicine

## 2016-02-07 ENCOUNTER — Encounter: Payer: Self-pay | Admitting: Sports Medicine

## 2016-02-07 DIAGNOSIS — K635 Polyp of colon: Secondary | ICD-10-CM

## 2016-02-07 NOTE — Telephone Encounter (Signed)
Referral placed to [facility], patient needs to get all of her colonoscopy reports and take them to her appointment with Dr. Harlow Asa

## 2016-02-07 NOTE — Telephone Encounter (Signed)
Completed form from insurance for PA on right knee OV. Faxed back.

## 2016-02-15 ENCOUNTER — Ambulatory Visit (INDEPENDENT_AMBULATORY_CARE_PROVIDER_SITE_OTHER): Payer: PRIVATE HEALTH INSURANCE | Admitting: Sports Medicine

## 2016-02-15 ENCOUNTER — Encounter: Payer: Self-pay | Admitting: Sports Medicine

## 2016-02-15 DIAGNOSIS — M170 Bilateral primary osteoarthritis of knee: Secondary | ICD-10-CM | POA: Diagnosis not present

## 2016-02-15 MED ORDER — FEXOFENADINE HCL 180 MG PO TABS: 180.0000 mg | ORAL_TABLET | Freq: Every day | ORAL | 3 refills | Status: DC

## 2016-02-15 MED ORDER — TRAMADOL HCL 50 MG PO TABS: ORAL_TABLET | ORAL | 0 refills | Status: DC

## 2016-02-15 NOTE — Progress Notes (Signed)
  Subjective:    CC: Right knee pain  HPI: This is a pleasant 65 year old female with known knee osteoarthritis, she was doing well after injection last month, recently had a colonoscopy and feels as though she was malpositioned, she will With recurrence of pain as well as swelling in her right knee. Severe, persistent, localized at the medial joint line. No mechanical symptoms.  Past medical history:  Negative.  See flowsheet/record as well for more information.  Surgical history: Negative.  See flowsheet/record as well for more information.  Family history: Negative.  See flowsheet/record as well for more information.  Social history: Negative.  See flowsheet/record as well for more information.  Allergies, and medications have been entered into the medical record, reviewed, and no changes needed.   Review of Systems: No fevers, chills, night sweats, weight loss, chest pain, or shortness of breath.   Objective:    General: Well Developed, well nourished, and in no acute distress.  Neuro: Alert and oriented x3, extra-ocular muscles intact, sensation grossly intact.  HEENT: Normocephalic, atraumatic, pupils equal round reactive to light, neck supple, no masses, no lymphadenopathy, thyroid nonpalpable.  Skin: Warm and dry, no rashes. Cardiac: Regular rate and rhythm, no murmurs rubs or gallops, no lower extremity edema.  Respiratory: Clear to auscultation bilaterally. Not using accessory muscles, speaking in full sentences. Right knee: Swollen, tender to palpation at the medial joint line, no warmth, erythema, induration. Good motion, good strength.  Impression and Recommendations:    Primary osteoarthritis of both knees Did well after the previous injection however was malpositioned during her colonoscopy and is having a recurrence of pain. It's too soon to consider another injection so we are going to simply do tramadol, she does get itching and has historically had to use Benadryl so  we will use fexofenadine so that she doesn't get the associated drowsiness.  I spent 25 minutes with this patient, greater than 50% was face-to-face time counseling regarding the above diagnoses

## 2016-02-15 NOTE — Assessment & Plan Note (Signed)
Did well after the previous injection however was malpositioned during her colonoscopy and is having a recurrence of pain. It's too soon to consider another injection so we are going to simply do tramadol, she does get itching and has historically had to use Benadryl so we will use fexofenadine so that she doesn't get the associated drowsiness.

## 2016-02-17 ENCOUNTER — Encounter: Payer: Self-pay | Admitting: Sports Medicine

## 2016-02-17 NOTE — Telephone Encounter (Signed)
Called Pt, appts scheduled. Per Physician, OK to set Pt for two injections a week to get injections completed in time.

## 2016-02-18 NOTE — Telephone Encounter (Signed)
Received call from [facility], they will deliver [place] Monday am prior to Pt's appt.

## 2016-02-21 ENCOUNTER — Ambulatory Visit (INDEPENDENT_AMBULATORY_CARE_PROVIDER_SITE_OTHER): Payer: PRIVATE HEALTH INSURANCE | Admitting: Sports Medicine

## 2016-02-21 ENCOUNTER — Encounter: Payer: Self-pay | Admitting: Sports Medicine

## 2016-02-21 DIAGNOSIS — M170 Bilateral primary osteoarthritis of knee: Secondary | ICD-10-CM

## 2016-02-21 NOTE — Assessment & Plan Note (Signed)
Orthovisc injection #1 of 4 into the right knee, return in one week for # 2 of 4.

## 2016-02-21 NOTE — Progress Notes (Signed)

## 2016-02-24 ENCOUNTER — Encounter: Payer: Self-pay | Admitting: Sports Medicine

## 2016-02-24 ENCOUNTER — Ambulatory Visit (INDEPENDENT_AMBULATORY_CARE_PROVIDER_SITE_OTHER): Payer: PRIVATE HEALTH INSURANCE | Admitting: Sports Medicine

## 2016-02-24 DIAGNOSIS — M170 Bilateral primary osteoarthritis of knee: Secondary | ICD-10-CM

## 2016-02-24 NOTE — Assessment & Plan Note (Signed)
Orthovisc injection #2 of 4 into right knee.

## 2016-02-24 NOTE — Progress Notes (Signed)

## 2016-02-28 ENCOUNTER — Encounter: Payer: Self-pay | Admitting: Sports Medicine

## 2016-02-28 ENCOUNTER — Ambulatory Visit: Payer: Self-pay | Admitting: Surgery

## 2016-02-28 ENCOUNTER — Ambulatory Visit (INDEPENDENT_AMBULATORY_CARE_PROVIDER_SITE_OTHER): Payer: PRIVATE HEALTH INSURANCE | Admitting: Sports Medicine

## 2016-02-28 DIAGNOSIS — M170 Bilateral primary osteoarthritis of knee: Secondary | ICD-10-CM | POA: Diagnosis not present

## 2016-02-28 DIAGNOSIS — S62316A Displaced fracture of base of fifth metacarpal bone, right hand, initial encounter for closed fracture: Secondary | ICD-10-CM | POA: Insufficient documentation

## 2016-02-28 NOTE — Progress Notes (Signed)

## 2016-02-28 NOTE — Assessment & Plan Note (Signed)
Orthovisc injection #3 of 4 into the right knee, return in one week for #4 of 4

## 2016-03-02 ENCOUNTER — Encounter: Payer: Self-pay | Admitting: Sports Medicine

## 2016-03-02 ENCOUNTER — Ambulatory Visit (INDEPENDENT_AMBULATORY_CARE_PROVIDER_SITE_OTHER): Payer: PRIVATE HEALTH INSURANCE | Admitting: Sports Medicine

## 2016-03-02 DIAGNOSIS — M170 Bilateral primary osteoarthritis of knee: Secondary | ICD-10-CM | POA: Diagnosis not present

## 2016-03-02 DIAGNOSIS — M5412 Radiculopathy, cervical region: Secondary | ICD-10-CM | POA: Diagnosis not present

## 2016-03-02 NOTE — Assessment & Plan Note (Signed)
Last Orthovisc injection today, patient was 25% better.  Return in one month to see if this was successful.

## 2016-03-02 NOTE — Progress Notes (Signed)
  Subjective:    CC: Follow-up  HPI: Right knee osteoarthritis: 25% better after 3 Orthovisc injections, here for #4.  Cervical radiculopathy: Desires cervical epidural.  Past medical history:  Negative.  See flowsheet/record as well for more information.  Surgical history: Negative.  See flowsheet/record as well for more information.  Family history: Negative.  See flowsheet/record as well for more information.  Social history: Negative.  See flowsheet/record as well for more information.  Allergies, and medications have been entered into the medical record, reviewed, and no changes needed.   Review of Systems: No fevers, chills, night sweats, weight loss, chest pain, or shortness of breath.   Objective:    General: Well Developed, well nourished, and in no acute distress.  Neuro: Alert and oriented x3, extra-ocular muscles intact, sensation grossly intact.  HEENT: Normocephalic, atraumatic, pupils equal round reactive to light, neck supple, no masses, no lymphadenopathy, thyroid nonpalpable.  Skin: Warm and dry, no rashes. Cardiac: Regular rate and rhythm, no murmurs rubs or gallops, no lower extremity edema.  Respiratory: Clear to auscultation bilaterally. Not using accessory muscles, speaking in full sentences.  Procedure: Real-time Ultrasound Guided Injection of right knee Device: GE Logiq E  Verbal informed consent obtained.  Time-out conducted.  Noted no overlying erythema, induration, or other signs of local infection.  Skin prepped in a sterile fashion.  Local anesthesia: Topical Ethyl chloride.  With sterile technique and under real time ultrasound guidance:   30 mg/2 mL of OrthoVisc (sodium hyaluronate) in a prefilled syringe was injected easily into the knee through a 22-gauge needle. Completed without difficulty  Pain immediately resolved suggesting accurate placement of the medication.  Advised to call if fevers/chills, erythema, induration, drainage, or persistent  bleeding.  Images permanently stored and available for review in the ultrasound unit.  Impression: Technically successful ultrasound guided injection.  Impression and Recommendations:    Primary osteoarthritis of both knees Last Orthovisc injection today, patient was 25% better.  Return in one month to see if this was successful.

## 2016-03-02 NOTE — Assessment & Plan Note (Signed)
Right C6-C7 interlaminar epidural

## 2016-03-10 ENCOUNTER — Other Ambulatory Visit: Payer: Self-pay | Admitting: Sports Medicine

## 2016-03-10 DIAGNOSIS — M170 Bilateral primary osteoarthritis of knee: Secondary | ICD-10-CM

## 2016-03-15 ENCOUNTER — Ambulatory Visit
Admission: RE | Admit: 2016-03-15 | Discharge: 2016-03-15 | Disposition: A | Discharge status: Home / Self Care | Payer: PRIVATE HEALTH INSURANCE | Source: Ambulatory Visit | Attending: Sports Medicine | Admitting: Sports Medicine

## 2016-03-15 MED ORDER — IOPAMIDOL (ISOVUE-M 300) INJECTION 61%: 1.0000 mL | Freq: Once | INTRAMUSCULAR | Status: DC | PRN

## 2016-03-15 MED ORDER — TRIAMCINOLONE ACETONIDE 40 MG/ML IJ SUSP (RADIOLOGY): 60.0000 mg | Freq: Once | INTRAMUSCULAR | Status: DC

## 2016-03-22 ENCOUNTER — Encounter (HOSPITAL_COMMUNITY)
Admission: RE | Admit: 2016-03-22 | Discharge: 2016-03-22 | Disposition: A | Discharge status: Home / Self Care | Payer: PRIVATE HEALTH INSURANCE | Source: Ambulatory Visit | Attending: Surgery | Admitting: Surgery

## 2016-03-22 ENCOUNTER — Encounter (HOSPITAL_COMMUNITY): Payer: Self-pay | Admitting: Surgery

## 2016-03-22 ENCOUNTER — Encounter (HOSPITAL_COMMUNITY): Payer: Self-pay

## 2016-03-22 DIAGNOSIS — I10 Essential (primary) hypertension: Secondary | ICD-10-CM | POA: Diagnosis not present

## 2016-03-22 DIAGNOSIS — Z01818 Encounter for other preprocedural examination: Secondary | ICD-10-CM | POA: Diagnosis not present

## 2016-03-22 DIAGNOSIS — K635 Polyp of colon: Secondary | ICD-10-CM | POA: Insufficient documentation

## 2016-03-22 DIAGNOSIS — Z9049 Acquired absence of other specified parts of digestive tract: Secondary | ICD-10-CM | POA: Diagnosis present

## 2016-03-22 HISTORY — DX: Nausea with vomiting, unspecified: R11.2

## 2016-03-22 HISTORY — DX: Other specified postprocedural states: Z98.890

## 2016-03-22 HISTORY — DX: Polyp of colon: K63.5

## 2016-03-22 LAB — BASIC METABOLIC PANEL
Anion gap: 8 (ref 5–15)
BUN: 17 mg/dL (ref 6–20)
CO2: 28 mmol/L (ref 22–32)
Calcium: 9.9 mg/dL (ref 8.9–10.3)
Chloride: 102 mmol/L (ref 101–111)
Creatinine, Ser: 0.87 mg/dL (ref 0.44–1.00)
GFR calc Af Amer: >60 mL/min (ref >60)
GLUCOSE: 98 mg/dL (ref 65–99)
Potassium: 4.1 mmol/L (ref 3.5–5.1)
Sodium: 138 mmol/L (ref 135–145)

## 2016-03-22 LAB — ABO/RH: ABO/RH(D): O POS

## 2016-03-22 LAB — CBC
HCT: 42.3 % (ref 36.0–46.0)
Hemoglobin: 14.2 g/dL (ref 12.0–15.0)
MCH: 31.9 pg (ref 26.0–34.0)
MCHC: 33.6 g/dL (ref 30.0–36.0)
MCV: 95.1 fL (ref 78.0–100.0)
PLATELETS: 298 10*3/uL (ref 150–400)
RBC: 4.45 MIL/uL (ref 3.87–5.11)
RDW: 12.1 % (ref 11.5–15.5)
WBC: 10.7 10*3/uL — ABNORMAL HIGH (ref 4.0–10.5)

## 2016-03-22 LAB — TYPE AND SCREEN
ABO/RH(D): O POS
Antibody Screen: NEGATIVE

## 2016-03-22 NOTE — Progress Notes (Signed)
Pt denies SOB, chest pain, and being under the care of a cardiologist. Pt stated that an echo was performed >10years ago but denies having a stress test and cardiac cath. Pt denies having an EKG and chest x ray within the last year. Pt denies having recent labs.

## 2016-03-22 NOTE — H&P (Signed)
General Surgery [facility]  ANAI KHATUN DOB: 03/22/1950 Married / Language: Cleophus Molt / Race: White Female   History of Present Illness   The patient is a 66 year old female who presents with a colonic polyp.  Patient is referred by Dr. Aundria Mems for evaluation for right colectomy for management of recurrent multiple polyps in the right colon. Patient had had an original virtual colonoscopy approximately 10 years ago as a screening test. This was negative. Patient had her first colonoscopy in May 2017, again as an initial screening test. She was found to have multiple benign polyps involving the ascending colon and transverse colon. These were resected. The largest polyp in the right colon near the ileocecal valve was resected piecemeal. Due to this fact, the patient underwent a short-term follow-up colonoscopy in November 2017. This shows recurrence of multiple polyps in the right colon. Biopsies again were benign. A single polyp in the transverse colon was completely removed. The most distal polyp in the ascending colon was marked with tattoo ink. The patient's gastroenterologist recommended right colectomy for management of multiple recurrent polyps of the right colon. Patient is known to my practice from laparoscopic cholecystectomy approximately 8 or 9 years ago. Patient works at [employer]. Previous abdominal surgery includes cholecystectomy, laparoscopic hysterectomy, cesarean section, and bilateral tubal ligation. Patient denies any bleeding per rectum except immediately following her colonoscopy with biopsy. She denies any change in her bowel habits. Patient presents today to discuss right colectomy for management of multiple recurrent right colon polyps.   Past Surgical History Appendectomy  Cesarean Section - 1  Colon Polyp Removal - Colonoscopy  Foot Surgery  Bilateral. Gallbladder Surgery - Laparoscopic  Hysterectomy  (not due to cancer) - Complete   Diagnostic Studies History Colonoscopy  within last year Mammogram  within last year  Allergies HydroCHLOROthiazide *DIURETICS*  lowered potassium level Betadine *ANTISEPTICS & DISINFECTANTS*  Itching.  Medication History  B Complex (Oral) Active. Calcium Carbonate (600MG  Tablet, Oral) Active. Vitamin D (Cholecalciferol) (1000UNIT Capsule, Oral) Active. Pepcid AC (10MG  Tablet, Oral) Active. Allegra (180MG  Tablet, Oral) Active. Fish Oil (1000MG  Capsule, Oral) Active. OrthoVisc (30MG /2ML Soln Pref Syr, Intra-articular) Active. Mobic (15MG  Tablet, Oral) Active. Estraderm (0.1MG /24HR Patch TW, Transdermal) Active. Pravachol (20MG  Tablet, Oral) Active. Aldactone (25MG  Tablet, Oral) Active. Zanaflex (4MG  Tablet, Oral) Active. Ultram (50MG  Tablet, Oral) Active. Ambien (5MG  Tablet, Oral) Active. Medications Reconciled  Social History  Alcohol use  Occasional alcohol use. Caffeine use  Tea. No drug use  Tobacco use  Never smoker.  Family History  Cancer  Mother. Diabetes Mellitus  Father, Mother, Sister. Heart Disease  Father. Hypertension  Father. Respiratory Condition  Father.  Pregnancy / Birth History  Age at menarche  53 years. Age of menopause  28-50 Gravida  2 Length (months) of breastfeeding  3-6 Maternal age  10-25 Para  2  Other Problems Arthritis  Gastroesophageal Reflux Disease  Hemorrhoids  High blood pressure    Review of Systems  General Not Present- Appetite Loss, Chills, Fatigue, Fever, Night Sweats, Weight Gain and Weight Loss. Skin Not Present- Change in Wart/Mole, Dryness, Hives, Jaundice, New Lesions, Non-Healing Wounds, Rash and Ulcer. HEENT Present- Seasonal Allergies. Not Present- Earache, Hearing Loss, Hoarseness, Nose Bleed, Oral Ulcers, Ringing in the Ears, Sinus Pain, Sore Throat, Visual Disturbances, Wears glasses/contact lenses and Yellow Eyes. Respiratory Not  Present- Bloody sputum, Chronic Cough, Difficulty Breathing, Snoring and Wheezing. Breast Not Present- Breast Mass, Breast Pain, Nipple Discharge and Skin Changes.  Cardiovascular Not Present- Chest Pain, Difficulty Breathing Lying Down, Leg Cramps, Palpitations, Rapid Heart Rate, Shortness of Breath and Swelling of Extremities. Gastrointestinal Present- Hemorrhoids. Not Present- Abdominal Pain, Bloating, Bloody Stool, Change in Bowel Habits, Chronic diarrhea, Constipation, Difficulty Swallowing, Excessive gas, Gets full quickly at meals, Indigestion, Nausea, Rectal Pain and Vomiting. Female Genitourinary Not Present- Frequency, Nocturia, Painful Urination, Pelvic Pain and Urgency. Musculoskeletal Not Present- Back Pain, Joint Pain, Joint Stiffness, Muscle Pain, Muscle Weakness and Swelling of Extremities. Neurological Not Present- Decreased Memory, Fainting, Headaches, Numbness, Seizures, Tingling, Tremor, Trouble walking and Weakness. Psychiatric Not Present- Anxiety, Bipolar, Change in Sleep Pattern, Depression, Fearful and Frequent crying. Endocrine Not Present- Cold Intolerance, Excessive Hunger, Hair Changes, Heat Intolerance, Hot flashes and New Diabetes. Hematology Not Present- Blood Thinners, Easy Bruising, Excessive bleeding, Gland problems, HIV and Persistent Infections.  Vitals  Weight: 166 lb Height: 63in Body Surface Area: 1.79 m Body Mass Index: 29.41 kg/m  Temp.: 97.28F(Oral)  Pulse: 87 (Regular)  BP: 122/78 (Sitting, Left Arm, Standard)   Physical Exam  The physical exam findings are as follows: Note:General - appears comfortable, no distress; not diaphorectic  HEENT - normocephalic; sclerae clear, gaze conjugate; mucous membranes moist, dentition good; voice normal  Neck - symmetric on extension; no palpable anterior or posterior cervical adenopathy; no palpable masses in the thyroid bed  Chest - clear bilaterally without rhonchi, rales, or wheeze  Cor -  regular rhythm with normal rate; no significant murmur  Abd - soft without distension; well-healed surgical incisions with no sign of incisional hernia; no hepatosplenomegaly; no palpable masses; no tenderness  Ext - non-tender without significant edema or lymphedema  Neuro - grossly intact; no tremor    Assessment & Plan  POLYP OF ASCENDING COLON, UNSPECIFIED TYPE (D12.2)  Pt Education - Pamphlet Given - Colorectal Surgery: discussed with patient and provided information. Patient presents with multiple rigt colon polyps which have been recurrent over a 6 month interval. Her gastroenterologist has recommended right colectomy for definitive management. Patient is provided with written literature on colon surgery to review at home.  Patient I reviewed her colonoscopy reports and her pathology reports from her procedure in May 2017 and again from her procedure in November 2017. There is no evidence of malignancy. However there was significant growth and multiple polyps in the right colon over the 6 month interval. I agree with the recommendation to perform right colectomy in order to prevent development of more concerning neoplasm in the right colon. The remainder of the colon was relatively normal on both of her exams with the exception of a few scattered diverticuli.  Patient I discussed right colectomy in detail. We discussed the location of the surgical incision. We discussed other options including laparoscopic surgery. We discussed risk of the procedure including the risk of infection. Patient understands and wishes to proceed with surgery in January 2018.  The risks and benefits of the procedure have been discussed at length with the patient. The patient understands the proposed procedure, potential alternative treatments, and the course of recovery to be expected. All of the patient's questions have been answered at this time. The patient wishes to proceed with surgery.  [name], MD, [facility] Office: [phone]

## 2016-03-22 NOTE — Pre-Procedure Instructions (Signed)
KATHLEENE BASILONE  03/22/2016      [place], [address] [place] [address] Phone: [phone] Fax: [phone]  [facility] [place] [facility] [address] [facility] [address] [place] [address] Phone: [phone] Fax: [place], [place] [address] [place] [address] Phone: [phone] Fax: [phone]    Your procedure is scheduled on Thursday, March 30, 2016  Report to [facility] Admitting at 8:00 A.M.  Call this number if you have problems the morning of surgery:  [phone]   Remember: Drink plenty of clear liquids the day prior to surgery to avoid getting dehydrated  Do not eat food or drink liquids after midnight Wednesday, March 29, 2016  Take these medicines the morning of surgery with A SIP OF WATER : fexofenadine (ALLEGRA),  if needed: Famotidine (PEPCID), traMADol (ULTRAM)  Stop taking Aspirin, vitamins, fish oil and herbal medications. Do not take any NSAIDs ie: Ibuprofen, Advil, Naproxen, BC and Goody Powder or any medication containing Aspirin such as Meloxicam [facility]); stop Thursday, March 23, 2016.  Do not wear jewelry, make-up or nail polish.  Do not wear lotions, powders, or perfumes, or deoderant.  Do not shave 48 hours prior to surgery.    Do not bring valuables to the hospital.  [facility] is not responsible for any belongings or valuables.  Contacts, dentures or bridgework may not be worn into surgery.  Leave your suitcase in the car.  After surgery it may be brought to your room.  For patients admitted to the hospital, discharge time will be determined by your treatment team. Special instructions:  [facility] - Preparing for Surgery  Before surgery, you can play an important role.  Because skin is not sterile,  your skin needs to be as free of germs as possible.  You can reduce the number of germs on you skin by washing with CHG (chlorahexidine gluconate) soap before surgery.  CHG is an antiseptic cleaner which kills germs and bonds with the skin to continue killing germs even after washing.  Please DO NOT use if you have an allergy to CHG or antibacterial soaps.  If your skin becomes reddened/irritated stop using the CHG and inform your nurse when you arrive at Short Stay.  Do not shave (including legs and underarms) for at least 48 hours prior to the first CHG shower.  You may shave your face.  Please follow these instructions carefully:   1.  Shower with CHG Soap the night before surgery and the morning of Surgery.  2.  If you choose to wash your hair, wash your hair first as usual with your normal shampoo.  3.  After you shampoo, rinse your hair and body thoroughly to remove the Shampoo.  4.  Use CHG as you would any other liquid soap.  You can apply chg directly  to the skin and wash gently with scrungie or a clean washcloth.  5.  Apply the CHG Soap to your body ONLY FROM THE NECK DOWN.  Do not use on open wounds or open sores.  Avoid contact with your eyes, ears, mouth and genitals (private parts).  Wash genitals (private parts) with your normal soap.  6.  Wash thoroughly, paying special attention to the area where your  surgery will be performed.  7.  Thoroughly rinse your body with warm water from the neck down.  8.  DO NOT shower/wash with your normal soap after using and rinsing off the CHG Soap.  9.  Pat yourself dry with a clean towel.            10.  Wear clean pajamas.            11.  Place clean sheets on your bed the night of your first shower and do not sleep with pets.  Day of Surgery  Do not apply any lotions/deoderants the morning of surgery.  Please wear clean clothes to the hospital/surgery center.  Please read over the following fact sheets that you were given. Pain Booklet,  Coughing and Deep Breathing, Blood Transfusion Information and Surgical Site Infection Prevention and Colon Bowel Prep

## 2016-03-29 MED ORDER — SODIUM CHLORIDE 0.9 % IV SOLN
1.0000 g | INTRAVENOUS | Status: AC
  Administered 2016-03-30: 1 g via INTRAVENOUS
  Filled 2016-03-29: qty 1

## 2016-03-29 MED ORDER — ALVIMOPAN 12 MG PO CAPS
12.0000 mg | ORAL_CAPSULE | Freq: Once | ORAL | Status: AC
  Administered 2016-03-30: 12 mg via ORAL
  Filled 2016-03-29 (×2): qty 1

## 2016-03-30 ENCOUNTER — Inpatient Hospital Stay (HOSPITAL_COMMUNITY): Payer: PRIVATE HEALTH INSURANCE | Admitting: Certified Registered Nurse Anesthetist

## 2016-03-30 ENCOUNTER — Ambulatory Visit: Payer: PRIVATE HEALTH INSURANCE | Admitting: Sports Medicine

## 2016-03-30 ENCOUNTER — Encounter (HOSPITAL_COMMUNITY): Payer: Self-pay | Admitting: *Deleted

## 2016-03-30 ENCOUNTER — Encounter (HOSPITAL_COMMUNITY)
Admission: RE | Disposition: A | Discharge status: Home / Self Care | Payer: Self-pay | Source: Ambulatory Visit | Attending: Surgery

## 2016-03-30 ENCOUNTER — Inpatient Hospital Stay (HOSPITAL_COMMUNITY)
Admission: RE | Admit: 2016-03-30 | Discharge: 2016-04-02 | DRG: 331 | Disposition: A | Discharge status: Home / Self Care | Payer: PRIVATE HEALTH INSURANCE | Source: Ambulatory Visit | Attending: Surgery | Admitting: Surgery

## 2016-03-30 DIAGNOSIS — D122 Benign neoplasm of ascending colon: Principal | ICD-10-CM | POA: Diagnosis present

## 2016-03-30 DIAGNOSIS — Z90710 Acquired absence of both cervix and uterus: Secondary | ICD-10-CM

## 2016-03-30 DIAGNOSIS — K635 Polyp of colon: Secondary | ICD-10-CM

## 2016-03-30 DIAGNOSIS — Z9049 Acquired absence of other specified parts of digestive tract: Secondary | ICD-10-CM | POA: Diagnosis present

## 2016-03-30 DIAGNOSIS — K219 Gastro-esophageal reflux disease without esophagitis: Secondary | ICD-10-CM | POA: Diagnosis present

## 2016-03-30 DIAGNOSIS — Z79899 Other long term (current) drug therapy: Secondary | ICD-10-CM | POA: Diagnosis not present

## 2016-03-30 DIAGNOSIS — I10 Essential (primary) hypertension: Secondary | ICD-10-CM | POA: Diagnosis present

## 2016-03-30 HISTORY — PX: PARTIAL COLECTOMY: SHX5273

## 2016-03-30 LAB — CBC
HEMATOCRIT: 41.9 % (ref 36.0–46.0)
HEMOGLOBIN: 14.3 g/dL (ref 12.0–15.0)
MCH: 32.2 pg (ref 26.0–34.0)
MCHC: 34.1 g/dL (ref 30.0–36.0)
MCV: 94.4 fL (ref 78.0–100.0)
Platelets: 302 10*3/uL (ref 150–400)
RBC: 4.44 MIL/uL (ref 3.87–5.11)
RDW: 12.1 % (ref 11.5–15.5)
WBC: 18.1 10*3/uL — ABNORMAL HIGH (ref 4.0–10.5)

## 2016-03-30 LAB — CREATININE, SERUM
CREATININE: 0.96 mg/dL (ref 0.44–1.00)
GFR calc Af Amer: >60 mL/min (ref >60)
GFR calc non Af Amer: >60 mL/min (ref >60)

## 2016-03-30 SURGERY — COLECTOMY, PARTIAL
Anesthesia: General | Site: Abdomen | Laterality: Right

## 2016-03-30 MED ORDER — ONDANSETRON HCL 4 MG/2ML IJ SOLN
INTRAMUSCULAR | Status: AC
  Filled 2016-03-30: qty 2

## 2016-03-30 MED ORDER — CHLORHEXIDINE GLUCONATE CLOTH 2 % EX PADS: 6.0000 | MEDICATED_PAD | Freq: Once | CUTANEOUS | Status: DC

## 2016-03-30 MED ORDER — DEXAMETHASONE SODIUM PHOSPHATE 10 MG/ML IJ SOLN
INTRAMUSCULAR | Status: DC | PRN
  Administered 2016-03-30: 10 mg via INTRAVENOUS

## 2016-03-30 MED ORDER — MIDAZOLAM HCL 2 MG/2ML IJ SOLN
INTRAMUSCULAR | Status: AC
  Filled 2016-03-30: qty 2

## 2016-03-30 MED ORDER — HYDROMORPHONE HCL 1 MG/ML IJ SOLN
INTRAMUSCULAR | Status: AC
  Filled 2016-03-30: qty 1.5

## 2016-03-30 MED ORDER — ONDANSETRON HCL 4 MG/2ML IJ SOLN: 4.0000 mg | Freq: Four times a day (QID) | INTRAMUSCULAR | Status: DC | PRN

## 2016-03-30 MED ORDER — EPHEDRINE SULFATE 50 MG/ML IJ SOLN
INTRAMUSCULAR | Status: DC | PRN
  Administered 2016-03-30: 5 mg via INTRAVENOUS

## 2016-03-30 MED ORDER — HYDROMORPHONE HCL 1 MG/ML IJ SOLN
0.2500 mg | INTRAMUSCULAR | Status: DC | PRN
  Administered 2016-03-30: 0.5 mg via INTRAVENOUS
  Administered 2016-03-30: 1 mg via INTRAVENOUS
  Administered 2016-03-30: 0.5 mg via INTRAVENOUS

## 2016-03-30 MED ORDER — FENTANYL CITRATE (PF) 100 MCG/2ML IJ SOLN
INTRAMUSCULAR | Status: AC
  Filled 2016-03-30: qty 4

## 2016-03-30 MED ORDER — DIPHENHYDRAMINE HCL 50 MG/ML IJ SOLN
INTRAMUSCULAR | Status: DC | PRN
  Administered 2016-03-30: 12.5 mg via INTRAVENOUS

## 2016-03-30 MED ORDER — 0.9 % SODIUM CHLORIDE (POUR BTL) OPTIME
TOPICAL | Status: DC | PRN
  Administered 2016-03-30 (×2): 1000 mL

## 2016-03-30 MED ORDER — MIDAZOLAM HCL 2 MG/2ML IJ SOLN
INTRAMUSCULAR | Status: DC | PRN
  Administered 2016-03-30: 2 mg via INTRAVENOUS

## 2016-03-30 MED ORDER — HYDROMORPHONE 1 MG/ML IV SOLN
INTRAVENOUS | Status: AC
  Filled 2016-03-30: qty 25

## 2016-03-30 MED ORDER — FENTANYL CITRATE (PF) 100 MCG/2ML IJ SOLN
INTRAMUSCULAR | Status: DC | PRN
  Administered 2016-03-30: 75 ug via INTRAVENOUS
  Administered 2016-03-30: 50 ug via INTRAVENOUS
  Administered 2016-03-30: 75 ug via INTRAVENOUS

## 2016-03-30 MED ORDER — NALOXONE HCL 0.4 MG/ML IJ SOLN: 0.4000 mg | INTRAMUSCULAR | Status: DC | PRN

## 2016-03-30 MED ORDER — HYDROMORPHONE 1 MG/ML IV SOLN
INTRAVENOUS | Status: DC
  Administered 2016-03-30: 2.4 mg via INTRAVENOUS
  Administered 2016-03-30: 12:00:00 via INTRAVENOUS
  Administered 2016-03-30: 0.2 mg via INTRAVENOUS
  Administered 2016-03-31: 1.4 mg via INTRAVENOUS
  Administered 2016-03-31: 1.2 mg via INTRAVENOUS
  Administered 2016-03-31: 0.8 mg via INTRAVENOUS

## 2016-03-30 MED ORDER — ALVIMOPAN 12 MG PO CAPS
12.0000 mg | ORAL_CAPSULE | Freq: Two times a day (BID) | ORAL | Status: DC
  Administered 2016-03-31 (×2): 12 mg via ORAL
  Filled 2016-03-30 (×3): qty 1

## 2016-03-30 MED ORDER — SUGAMMADEX SODIUM 200 MG/2ML IV SOLN
INTRAVENOUS | Status: AC
  Filled 2016-03-30: qty 2

## 2016-03-30 MED ORDER — DIPHENHYDRAMINE HCL 50 MG/ML IJ SOLN
12.5000 mg | Freq: Four times a day (QID) | INTRAMUSCULAR | Status: DC | PRN
  Administered 2016-03-30: 12.5 mg via INTRAVENOUS
  Filled 2016-03-30: qty 1

## 2016-03-30 MED ORDER — EPHEDRINE 5 MG/ML INJ
INTRAVENOUS | Status: AC
  Filled 2016-03-30: qty 10

## 2016-03-30 MED ORDER — PHENYLEPHRINE 40 MCG/ML (10ML) SYRINGE FOR IV PUSH (FOR BLOOD PRESSURE SUPPORT)
PREFILLED_SYRINGE | INTRAVENOUS | Status: AC
  Filled 2016-03-30: qty 10

## 2016-03-30 MED ORDER — PHENYLEPHRINE HCL 10 MG/ML IJ SOLN
INTRAMUSCULAR | Status: DC | PRN
  Administered 2016-03-30: 40 ug via INTRAVENOUS

## 2016-03-30 MED ORDER — PROPOFOL 10 MG/ML IV BOLUS
INTRAVENOUS | Status: DC | PRN
  Administered 2016-03-30: 150 mg via INTRAVENOUS

## 2016-03-30 MED ORDER — KCL IN DEXTROSE-NACL 20-5-0.45 MEQ/L-%-% IV SOLN
INTRAVENOUS | Status: DC
Start: 2016-03-30 — End: 2016-04-01
  Administered 2016-03-30 – 2016-03-31 (×3): via INTRAVENOUS
  Filled 2016-03-30 (×3): qty 1000

## 2016-03-30 MED ORDER — HYDROMORPHONE HCL 1 MG/ML IJ SOLN
INTRAMUSCULAR | Status: AC
  Filled 2016-03-30: qty 0.5

## 2016-03-30 MED ORDER — ONDANSETRON 4 MG PO TBDP: 4.0000 mg | ORAL_TABLET | Freq: Four times a day (QID) | ORAL | Status: DC | PRN

## 2016-03-30 MED ORDER — ROCURONIUM BROMIDE 100 MG/10ML IV SOLN
INTRAVENOUS | Status: DC | PRN
  Administered 2016-03-30: 50 mg via INTRAVENOUS

## 2016-03-30 MED ORDER — DIPHENHYDRAMINE HCL 12.5 MG/5ML PO ELIX: 12.5000 mg | ORAL_SOLUTION | Freq: Four times a day (QID) | ORAL | Status: DC | PRN

## 2016-03-30 MED ORDER — SODIUM CHLORIDE 0.9% FLUSH: 9.0000 mL | INTRAVENOUS | Status: DC | PRN

## 2016-03-30 MED ORDER — LACTATED RINGERS IV SOLN
INTRAVENOUS | Status: DC
Start: 2016-03-30 — End: 2016-03-30
  Administered 2016-03-30 (×2): via INTRAVENOUS

## 2016-03-30 MED ORDER — DEXAMETHASONE SODIUM PHOSPHATE 10 MG/ML IJ SOLN
INTRAMUSCULAR | Status: AC
  Filled 2016-03-30: qty 1

## 2016-03-30 MED ORDER — ACETAMINOPHEN 650 MG RE SUPP: 650.0000 mg | Freq: Four times a day (QID) | RECTAL | Status: DC | PRN

## 2016-03-30 MED ORDER — SUGAMMADEX SODIUM 200 MG/2ML IV SOLN
INTRAVENOUS | Status: DC | PRN
  Administered 2016-03-30: 150 mg via INTRAVENOUS

## 2016-03-30 MED ORDER — ENOXAPARIN SODIUM 40 MG/0.4ML ~~LOC~~ SOLN
40.0000 mg | SUBCUTANEOUS | Status: DC
  Administered 2016-03-31 – 2016-04-01 (×2): 40 mg via SUBCUTANEOUS
  Filled 2016-03-30 (×2): qty 0.4

## 2016-03-30 MED ORDER — HYDROMORPHONE 1 MG/ML IV SOLN: INTRAVENOUS | Status: DC

## 2016-03-30 MED ORDER — LIDOCAINE HCL (CARDIAC) 20 MG/ML IV SOLN
INTRAVENOUS | Status: DC | PRN
  Administered 2016-03-30: 80 mg via INTRAVENOUS

## 2016-03-30 MED ORDER — ONDANSETRON HCL 4 MG/2ML IJ SOLN
INTRAMUSCULAR | Status: DC | PRN
  Administered 2016-03-30: 4 mg via INTRAVENOUS

## 2016-03-30 MED ORDER — ACETAMINOPHEN 325 MG PO TABS: 650.0000 mg | ORAL_TABLET | Freq: Four times a day (QID) | ORAL | Status: DC | PRN

## 2016-03-30 MED ORDER — SODIUM CHLORIDE 0.9 % IV SOLN
1.0000 g | INTRAVENOUS | Status: DC
  Filled 2016-03-30: qty 1

## 2016-03-30 MED ORDER — PROMETHAZINE HCL 25 MG/ML IJ SOLN: 6.2500 mg | INTRAMUSCULAR | Status: DC | PRN

## 2016-03-30 MED ORDER — DIPHENHYDRAMINE HCL 50 MG/ML IJ SOLN
INTRAMUSCULAR | Status: AC
  Filled 2016-03-30: qty 1

## 2016-03-30 SURGICAL SUPPLY — 56 items
BLADE SURG ROTATE 9660 (MISCELLANEOUS) ×2 IMPLANT
CANISTER SUCTION 2500CC (MISCELLANEOUS) ×2 IMPLANT
CHLORAPREP W/TINT 26ML (MISCELLANEOUS) ×2 IMPLANT
COVER MAYO STAND STRL (DRAPES) ×4 IMPLANT
COVER SURGICAL LIGHT HANDLE (MISCELLANEOUS) ×4 IMPLANT
DRAPE LAPAROSCOPIC ABDOMINAL (DRAPES) ×2 IMPLANT
DRAPE PROXIMA HALF (DRAPES) ×4 IMPLANT
DRAPE UTILITY XL STRL (DRAPES) ×10 IMPLANT
DRAPE WARM FLUID 44X44 (DRAPE) ×2 IMPLANT
DRSG OPSITE POSTOP 4X10 (GAUZE/BANDAGES/DRESSINGS) IMPLANT
DRSG OPSITE POSTOP 4X8 (GAUZE/BANDAGES/DRESSINGS) ×2 IMPLANT
ELECT BLADE 6.5 EXT (BLADE) ×4 IMPLANT
ELECT CAUTERY BLADE 6.4 (BLADE) ×4 IMPLANT
ELECT REM PT RETURN 9FT ADLT (ELECTROSURGICAL) ×2
ELECTRODE REM PT RTRN 9FT ADLT (ELECTROSURGICAL) ×1 IMPLANT
GLOVE BIOGEL PI IND STRL 7.0 (GLOVE) ×1 IMPLANT
GLOVE BIOGEL PI INDICATOR 7.0 (GLOVE) ×1
GLOVE SURG ORTHO 8.0 STRL STRW (GLOVE) ×4 IMPLANT
GLOVE SURG SS PI 7.0 STRL IVOR (GLOVE) ×2 IMPLANT
GOWN STRL REUS W/ TWL LRG LVL3 (GOWN DISPOSABLE) ×4 IMPLANT
GOWN STRL REUS W/ TWL XL LVL3 (GOWN DISPOSABLE) ×4 IMPLANT
GOWN STRL REUS W/TWL LRG LVL3 (GOWN DISPOSABLE) ×4
GOWN STRL REUS W/TWL XL LVL3 (GOWN DISPOSABLE) ×4
KIT BASIN OR (CUSTOM PROCEDURE TRAY) ×2 IMPLANT
KIT ROOM TURNOVER OR (KITS) ×2 IMPLANT
LEGGING LITHOTOMY PAIR STRL (DRAPES) IMPLANT
LIGASURE IMPACT 36 18CM CVD LR (INSTRUMENTS) ×2 IMPLANT
NS IRRIG 1000ML POUR BTL (IV SOLUTION) ×4 IMPLANT
PACK GENERAL/GYN (CUSTOM PROCEDURE TRAY) ×2 IMPLANT
PAD ARMBOARD 7.5X6 YLW CONV (MISCELLANEOUS) ×2 IMPLANT
PENCIL BUTTON HOLSTER BLD 10FT (ELECTRODE) ×2 IMPLANT
RELOAD PROXIMATE 75MM BLUE (ENDOMECHANICALS) ×4 IMPLANT
SPECIMEN JAR X LARGE (MISCELLANEOUS) ×2 IMPLANT
SPONGE LAP 18X18 X RAY DECT (DISPOSABLE) IMPLANT
STAPLER GUN LINEAR PROX 60 (STAPLE) ×2 IMPLANT
STAPLER PROXIMATE 75MM BLUE (STAPLE) ×2 IMPLANT
STAPLER VISISTAT 35W (STAPLE) ×2 IMPLANT
SUCTION POOLE TIP (SUCTIONS) ×2 IMPLANT
SURGILUBE 2OZ TUBE FLIPTOP (MISCELLANEOUS) IMPLANT
SUT PDS AB 1 TP1 96 (SUTURE) ×4 IMPLANT
SUT PROLENE 2 0 CT2 30 (SUTURE) IMPLANT
SUT PROLENE 2 0 KS (SUTURE) IMPLANT
SUT SILK 2 0 (SUTURE) ×1
SUT SILK 2 0 SH CR/8 (SUTURE) ×2 IMPLANT
SUT SILK 2-0 18XBRD TIE 12 (SUTURE) ×1 IMPLANT
SUT SILK 3 0 (SUTURE) ×1
SUT SILK 3 0 SH CR/8 (SUTURE) ×2 IMPLANT
SUT SILK 3-0 18XBRD TIE 12 (SUTURE) ×1 IMPLANT
SUT VIC AB 3-0 SH 18 (SUTURE) IMPLANT
SYR BULB IRRIGATION 50ML (SYRINGE) ×2 IMPLANT
TOWEL OR 17X26 10 PK STRL BLUE (TOWEL DISPOSABLE) ×4 IMPLANT
TRAY FOLEY CATH 14FRSI W/METER (CATHETERS) ×2 IMPLANT
TRAY PROCTOSCOPIC FIBER OPTIC (SET/KITS/TRAYS/PACK) IMPLANT
TUBE CONNECTING 12X1/4 (SUCTIONS) ×2 IMPLANT
WATER STERILE IRR 1000ML POUR (IV SOLUTION) IMPLANT
YANKAUER SUCT BULB TIP NO VENT (SUCTIONS) ×2 IMPLANT

## 2016-03-30 NOTE — Anesthesia Procedure Notes (Signed)
Procedure Name: Intubation Date/Time: 03/30/2016 9:58 AM Performed by: Rica Koyanagi Pre-anesthesia Checklist: Patient identified, Emergency Drugs available, Suction available, Patient being monitored and Timeout performed Patient Re-evaluated:Patient Re-evaluated prior to inductionOxygen Delivery Method: Circle System Utilized Preoxygenation: Pre-oxygenation with 100% oxygen Intubation Type: IV induction Ventilation: Mask ventilation without difficulty Laryngoscope Size: Mac and 3 Grade View: Grade I Tube type: Oral Tube size: 6.5 mm Number of attempts: 1 Airway Equipment and Method: Stylet and Oral airway Placement Confirmation: ETT inserted through vocal cords under direct vision,  positive ETCO2 and breath sounds checked- equal and bilateral Secured at: 21 cm Tube secured with: Tape Dental Injury: Teeth and Oropharynx as per pre-operative assessment

## 2016-03-30 NOTE — Transfer of Care (Signed)
Immediate Anesthesia Transfer of Care Note  Patient: [name]  Procedure(s) Performed: Procedure(s): RIGHT COLECTOMY (Right)  Patient Location: PACU  Anesthesia Type:General  Level of Consciousness: awake, alert  and oriented  Airway & Oxygen Therapy: Patient Spontanous Breathing  Post-op Assessment: Report given to RN, Post -op Vital signs reviewed and stable and Patient moving all extremities  Post vital signs: Reviewed  Last Vitals:  Vitals:   03/30/16 0803  BP: (!) 146/80  Pulse: 69  Resp: 18  Temp: 36.3 C    Last Pain:  Vitals:   03/30/16 0803  TempSrc: Oral      Patients Stated Pain Goal: 3 ([phone] [id number])  Complications: No apparent anesthesia complications

## 2016-03-30 NOTE — Anesthesia Preprocedure Evaluation (Addendum)
Anesthesia Evaluation  Patient identified by MRN, date of birth, ID band Patient awake    History of Anesthesia Complications (+) PONV  Airway Mallampati: II  TM Distance: >3 FB Neck ROM: Full    Dental  (+) Teeth Intact   Pulmonary neg pulmonary ROS,    breath sounds clear to auscultation       Cardiovascular hypertension,  Rhythm:Regular Rate:Normal     Neuro/Psych  Neuromuscular disease    GI/Hepatic Neg liver ROS, GERD  ,  Endo/Other  negative endocrine ROS  Renal/GU negative Renal ROS     Musculoskeletal  (+) Arthritis ,   Abdominal   Peds  Hematology   Anesthesia Other Findings   Reproductive/Obstetrics                             Anesthesia Physical Anesthesia Plan  ASA: II  Anesthesia Plan: General   Post-op Pain Management:    Induction: Intravenous  Airway Management Planned: Oral ETT  Additional Equipment:   Intra-op Plan:   Post-operative Plan: Extubation in OR  Informed Consent: I have reviewed the patients History and Physical, chart, labs and discussed the procedure including the risks, benefits and alternatives for the proposed anesthesia with the patient or authorized representative who has indicated his/her understanding and acceptance.   Dental advisory given  Plan Discussed with:   Anesthesia Plan Comments:        Anesthesia Quick Evaluation

## 2016-03-30 NOTE — Interval H&P Note (Signed)
History and Physical Interval Note:  03/30/2016 9:20 AM  [name]  has presented today for surgery, with the diagnosis of multiple right colon polyps  The various methods of treatment have been discussed with the patient and family. After consideration of risks, benefits and other options for treatment, the patient has consented to    Procedure(s): RIGHT COLECTOMY (Right) as a surgical intervention .    The patient's history has been reviewed, patient examined, no change in status, stable for surgery.  I have reviewed the patient's chart and labs.  Questions were answered to the patient's satisfaction.    [name], MD, [facility] Office: [place]

## 2016-03-30 NOTE — Brief Op Note (Signed)
03/30/2016  11:29 AM  PATIENT:  [name]  66 y.o. female  PRE-OPERATIVE DIAGNOSIS:  multiple right colon polyps  POST-OPERATIVE DIAGNOSIS:  multiple right colon polyps  PROCEDURE:  Procedure(s): RIGHT COLECTOMY (Right)  SURGEON:  Surgeon(s) and Role:    * Armandina Gemma, MD - Primary  ASSISTANTS: Judyann Munson, RNFA   ANESTHESIA:   general  EBL:  Total I/O In: 1000 [I.V.:1000] Out: 120 [Urine:100; Blood:20]  BLOOD ADMINISTERED:none  DRAINS: none   LOCAL MEDICATIONS USED:  NONE  SPECIMEN:  Excision  DISPOSITION OF SPECIMEN:  PATHOLOGY  COUNTS:  YES  TOURNIQUET:  * No tourniquets in log *  DICTATION: .Other Dictation: Dictation Number [id number]  PLAN OF CARE: Admit to inpatient   PATIENT DISPOSITION:  PACU - hemodynamically stable.   Delay start of Pharmacological VTE agent (>24hrs) due to surgical blood loss or risk of bleeding: yes  [name], MD, [facility] Office: [phone]

## 2016-03-30 NOTE — Anesthesia Postprocedure Evaluation (Addendum)
Anesthesia Post Note  Patient: [name]  Procedure(s) Performed: Procedure(s) (LRB): RIGHT COLECTOMY (Right)  Patient location during evaluation: PACU Anesthesia Type: General Level of consciousness: awake, sedated and oriented Pain management: pain level controlled Vital Signs Assessment: post-procedure vital signs reviewed and stable Respiratory status: spontaneous breathing, nonlabored ventilation, respiratory function stable and patient connected to nasal cannula oxygen Cardiovascular status: blood pressure returned to baseline and stable Postop Assessment: no signs of nausea or vomiting Anesthetic complications: no       Last Vitals:  Vitals:   03/30/16 1250 03/30/16 1305  BP: (!) 158/86 (!) 149/93  Pulse: 78 69  Resp: 14 11  Temp:      Last Pain:  Vitals:   03/30/16 1305  TempSrc:   PainSc: 3                  Shresta Risden,JAMES TERRILL

## 2016-03-31 ENCOUNTER — Encounter (HOSPITAL_COMMUNITY): Payer: Self-pay

## 2016-03-31 MED ORDER — PANTOPRAZOLE SODIUM 40 MG PO TBEC
40.0000 mg | DELAYED_RELEASE_TABLET | Freq: Every day | ORAL | Status: DC
  Administered 2016-03-31 – 2016-04-01 (×2): 40 mg via ORAL
  Filled 2016-03-31 (×2): qty 1

## 2016-03-31 MED ORDER — HYDROCODONE-ACETAMINOPHEN 5-325 MG PO TABS: 1.0000 | ORAL_TABLET | ORAL | 0 refills | Status: DC | PRN

## 2016-03-31 MED ORDER — ACETAMINOPHEN 325 MG PO TABS: 650.0000 mg | ORAL_TABLET | ORAL | Status: DC | PRN

## 2016-03-31 MED ORDER — HYDROMORPHONE HCL 2 MG/ML IJ SOLN
1.0000 mg | INTRAMUSCULAR | Status: DC | PRN
  Administered 2016-03-31 (×2): 1 mg via INTRAVENOUS
  Filled 2016-03-31 (×2): qty 1

## 2016-03-31 MED ORDER — HYDROCODONE-ACETAMINOPHEN 5-325 MG PO TABS
1.0000 | ORAL_TABLET | ORAL | Status: DC | PRN
  Administered 2016-03-31 – 2016-04-02 (×6): 2 via ORAL
  Filled 2016-03-31 (×6): qty 2

## 2016-03-31 MED ORDER — LORATADINE 10 MG PO TABS
10.0000 mg | ORAL_TABLET | Freq: Every day | ORAL | Status: DC
Start: 2016-03-31 — End: 2016-04-02
  Administered 2016-03-31 – 2016-04-01 (×2): 10 mg via ORAL
  Filled 2016-03-31 (×2): qty 1

## 2016-03-31 MED ORDER — DIPHENHYDRAMINE HCL 50 MG/ML IJ SOLN
12.5000 mg | Freq: Four times a day (QID) | INTRAMUSCULAR | Status: DC | PRN
  Administered 2016-03-31 – 2016-04-01 (×2): 12.5 mg via INTRAVENOUS
  Filled 2016-03-31 (×2): qty 1

## 2016-03-31 NOTE — Progress Notes (Signed)
  General Surgery [facility]  Assessment & Plan:  Status post right colectomy for multiple colonic polyps - POD#1  Doing very well  Discontinue PCA, IV dilaudid and oral hydrocodone started  Encouraged OOB, ambulation  Begin clear liquid diet and advance as tolerated  On Entereg  Likely home Sunday if doing well        [name], MD, [facility]       Office: [phone]    Subjective: Patient comfortable, has been up to chair, voided, passing flatus.  Wants PCA discontinued.  Family at bedside.  Objective: Vital signs in last 24 hours: Temp:  [98 F (36.7 C)-98.5 F (36.9 C)] 98.5 F (36.9 C) (01/12 1019) Pulse Rate:  [66-82] 66 (01/12 1019) Resp:  [10-20] 20 (01/12 1019) BP: (121-158)/(57-99) 141/75 (01/12 1019) SpO2:  [97 %-100 %] 99 % (01/12 1019) Last BM Date: 03/29/16 (PTA)  Intake/Output from previous day: 01/11 0701 - 01/12 0700 In: 1923.3 [I.V.:1923.3] Out: 1670 [Urine:1650; Blood:20] Intake/Output this shift: Total I/O In: -  Out: 300 [Urine:300]  Physical Exam: HEENT - sclerae clear, mucous membranes moist Abdomen - soft, wound dry and intact; BS present Ext - no edema, non-tender Neuro - alert & oriented, no focal deficits  Lab Results:   Recent Labs  03/30/16 1541  WBC 18.1*  HGB 14.3  HCT 41.9  PLT 302   BMET  Recent Labs  03/30/16 1541  CREATININE 0.96   PT/INR No results for input(s): LABPROT, INR in the last 72 hours. Comprehensive Metabolic Panel:    Component Value Date/Time   NA 138 03/22/2016 0848   NA 140 05/26/2015 1543   K 4.1 03/22/2016 0848   K 4.2 05/26/2015 1543   CL 102 03/22/2016 0848   CL 103 05/26/2015 1543   CO2 28 03/22/2016 0848   CO2 25 05/26/2015 1543   BUN 17 03/22/2016 0848   BUN 13 05/26/2015 1543   CREATININE 0.96 03/30/2016 1541   CREATININE 0.87 03/22/2016 0848   CREATININE 0.90 05/26/2015 1543   CREATININE 0.87 05/25/2014 0859   GLUCOSE 98 03/22/2016 0848   GLUCOSE 86 05/26/2015 1543   GLUCOSE 79 01/24/2006 1228   CALCIUM 9.9 03/22/2016 0848   CALCIUM 9.8 05/26/2015 1543   AST 18 05/26/2015 1543   AST 20 05/25/2014 0859   ALT 19 05/26/2015 1543   ALT 40 (H) 05/25/2014 0859   ALKPHOS 50 05/26/2015 1543   ALKPHOS 70 05/25/2014 0859   BILITOT 0.9 05/26/2015 1543   BILITOT 0.7 05/25/2014 0859   PROT 6.6 05/26/2015 1543   PROT 7.1 05/25/2014 0859   ALBUMIN 4.2 05/26/2015 1543   ALBUMIN 4.5 05/25/2014 0859    Studies/Results: No results found.    [name] 03/31/2016  Patient ID: [name], female   DOB: 25-Jul-1950, 66 y.o.   MRN: [id number]

## 2016-03-31 NOTE — Op Note (Signed)
NAME:  [name], GRUNWALD NO.:  MEDICAL RECORD NO.:  [id number]  LOCATION:                                 FACILITY:  PHYSICIAN:  [name], MD           DATE OF BIRTH:  DATE OF PROCEDURE:  03/30/2016                              OPERATIVE REPORT   PREOPERATIVE DIAGNOSIS:  Right colonic polyps.  POSTOPERATIVE DIAGNOSIS:  Right colonic polyps.  PROCEDURE:  Right colectomy.  SURGEON:  [name], MD, FACS  ASSISTANT:  Judyann Munson, RNFA.  ANESTHESIA:  General.  ESTIMATED BLOOD LOSS:  Minimal.  PREPARATION:  ChloraPrep.  COMPLICATIONS:  None.  INDICATIONS:  The patient is a 66 year old, white female, referred by her primary care physician for right colectomy for management of recurrent multiple polyps in the right colon.  The patient had undergone a colonoscopy in May 2017.  Multiple benign polyps were removed from the ascending colon and transverse colon.  The largest polyp in the right colon near the ileocecal valve was resected piecemeal.  The patient underwent a short-term followup colonoscopy in November 2017, which showed recurrence of polyps in the right colon.  The patient was recommended to undergo right colectomy for management.  BODY OF REPORT:  Procedure was done in OR #2 at the [place].  The patient was brought to the operating room, placed in a supine position on the operating room table.  Following administration of general anesthesia, the patient was positioned and then prepped and draped in the usual aseptic fashion.  After ascertaining that an adequate level of anesthesia had been achieved, a transverse right midabdominal incision was made with a #10 blade. Dissection was carried through subcutaneous tissues.  Fascia was incised with the electrocautery.  Rectus muscle was divided with the electrocautery.  Posterior sheath was incised and the peritoneal cavity was entered cautiously.  A wound  protector was placed.  The right colon was identified and gently mobilized.  There was a normal-appearing terminal ileum.  There was a normal-appearing appendix.  Right colon appears grossly normal, but there is a tattoo in the mid ascending colon.  There are limited adhesions from previous cholecystectomy.  Terminal ileum is mobilized.  Mesentery was incised.  Terminal ileum was transected with a GIA stapler.  Peritoneum was incised around the base of the cecum and the appendix and continued along the lateral peritoneal reflection allowing for mobilization of the right colon.  The hepatic flexure was taken down using the LigaSure to divide the peritoneal attachments.  A point in the proximal third of the transverse colon is selected therefore preserving the middle colic vasculature.  Mesentery was incised and vasculature was divided with the LigaSure.  Transverse colon was divided with a GIA stapler.  The right colon mesentery was then mobilized and divided with the LigaSure.  The right colic vessels were divided between Kelly clamps with the LigaSure and then ligated with 2-0 silk ties.  Colon was submitted to Pathology for review.  Next, a side-to-side functionally end-to-end anastomosis was created between the terminal ileum and the transverse colon.  This was performed in the  usual fashion with a GIA stapler.  Staple line was inspected for hemostasis.  Enterotomy was closed with a TA-60 stapler.  Mesenteric defect was closed with interrupted 2-0 silk sutures.  Abdomen was irrigated with warm saline, which was evacuated.  Blood loss was minimal.  Contamination was minimal.  At this point, gowns, gloves, dressings, and drapes were changed.  After re-studying the field, the abdominal wall was closed in 2 layers with a running #1 PDS looped suture.  Subcutaneous tissues were irrigated. Skin was closed with stainless steel staples.  Wound was washed and dried and a honey comb dressing  was applied.  The patient was awakened from anesthesia and brought to the recovery room.  The patient tolerated the procedure well.   [name], MD, [facility] Office: [phone]     TMG/MEDQ  D:  03/30/2016  T:  03/31/2016  Job:  [id number]  cc:   Silverio Decamp, MD

## 2016-03-31 NOTE — Discharge Instructions (Signed)
[facility] ° °OPEN ABDOMINAL SURGERY: POST OP INSTRUCTIONS ° °Always review your discharge instruction sheet given to you by the facility where your surgery was performed. ° °1. A prescription for pain medication may be given to you upon discharge.  Take your pain medication as prescribed.  If narcotic pain medicine is not needed, then you may take acetaminophen (Tylenol) or ibuprofen (Advil) as needed. °2. Take your usually prescribed medications unless otherwise directed. °3. If you need a refill on your pain medication, please contact your pharmacy. They will contact our office to request authorization.  Prescriptions will not be filled after 5 pm or on weekends. °4. You should follow a light diet the first few days after arrival home, such as soup and crackers, unless your doctor has advised otherwise. A high-fiber, low fat diet can be resumed as tolerated.  Be sure to include plenty of fluids daily.  °5. Most patients will experience some swelling and bruising in the area of the incision. Ice packs will help. Swelling and bruising can take several days to resolve. °6. It is common to experience some constipation if taking pain medication after surgery.  Increasing fluid intake and taking a stool softener will usually help or prevent this problem from occurring.  A mild laxative (Milk of Magnesia or Miralax) should be taken according to package directions if there are no bowel movements after 48 hours. °7.  You may have steri-strips (small skin tapes) in place directly over the incision.  These strips should be left on the skin for 5-7 days.  Any sutures or staples will be removed at the office during your follow-up visit. You may find that a light gauze bandage over your incision may keep your staples from being rubbed or pulled. You may shower and replace the bandage daily. °8. ACTIVITIES:  You may resume regular (light) daily activities beginning the next day - such as daily self-care,  walking, climbing stairs - gradually increasing activities as tolerated.  You may have sexual intercourse when it is comfortable.  Refrain from any heavy lifting or straining until approved by your doctor.  You may drive when you no longer are taking prescription pain medication, you can comfortably wear a seatbelt, and you can safely maneuver your car and apply brakes. °9. You should see your doctor in the office for a follow-up appointment approximately 2-3 weeks after your surgery.  Make sure that you call for this appointment within a day or two after you arrive home to insure a convenient appointment time. ° °WHEN TO CALL YOUR DOCTOR: °1. Fever greater than 101.0 °2. Inability to urinate °3. Persistent nausea and/or vomiting °4. Extreme swelling or bruising °5. Continued bleeding from incision °6. Increased pain, redness, or drainage from the incision °7. Difficulty swallowing or breathing °8. Muscle cramping or spasms °9. Numbness or tingling in hands or around lips ° °IF YOU HAVE DISABILITY OR FAMILY LEAVE FORMS, YOU MUST BRING THEM TO THE OFFICE FOR PROCESSING.  PLEASE DO NOT GIVE THEM TO YOUR DOCTOR. ° °The clinic staff is available to answer your questions during regular business hours.  Please don’t hesitate to call and ask to speak to one of the nurses if you have concerns. ° °[facility] °Office: [phone] ° °For further questions, please visit °[email or web address] ° ° °

## 2016-04-01 MED ORDER — DIPHENHYDRAMINE HCL 12.5 MG/5ML PO ELIX: 12.5000 mg | ORAL_SOLUTION | Freq: Four times a day (QID) | ORAL | Status: DC | PRN

## 2016-04-01 NOTE — Progress Notes (Signed)
[facility] Office:  [phone] General Surgery Progress Note   LOS: 2 days  POD -  2 Days Post-Op  Assessment/Plan: 1.  RIGHT COLECTOMY - 03/30/2016 - [place]  Starting soft diet today  Doing well - possibly home tomorrow.  2.  DVT prophylaxis - Lovenox   Principal Problem:   Polyp of ascending colon Active Problems:   Colon polyps   Subjective:  Do well with po's.  Has had BM.  Ready to advance diet.  Objective:   Vitals:   03/31/16 2025 04/01/16 0605  BP: 135/82 137/87  Pulse: 71 72  Resp: 20 19  Temp: 98.1 F (36.7 C) 98.1 F (36.7 C)     Intake/Output from previous day:  01/12 0701 - 01/13 0700 In: 1660 [P.O.:540; I.V.:1120] Out: 3300 [Urine:3300]  Intake/Output this shift:  No intake/output data recorded.   Physical Exam:   General: WN WF who is alert and oriented.    HEENT: Normal. Pupils equal. .   Lungs: Clear.  IS = 2,000 cc   Abdomen: Soft with BS.   Wound: Clean.   Lab Results:    Recent Labs  03/30/16 1541  WBC 18.1*  HGB 14.3  HCT 41.9  PLT 302    BMET   Recent Labs  03/30/16 1541  CREATININE 0.96    PT/INR  No results for input(s): LABPROT, INR in the last 72 hours.  ABG  No results for input(s): PHART, HCO3 in the last 72 hours.  Invalid input(s): PCO2, PO2   Studies/Results:  No results found.   Anti-infectives:   Anti-infectives    Start     Dose/Rate Route Frequency Ordered Stop   03/30/16 0945  ertapenem (INVANZ) 1 g in sodium chloride 0.9 % 50 mL IVPB     1 g 100 mL/hr over 30 Minutes Intravenous On call to O.R. 03/29/16 0802 03/30/16 1002   03/30/16 0815  ertapenem (INVANZ) 1 g in sodium chloride 0.9 % 50 mL IVPB  Status:  Discontinued     1 g 100 mL/hr over 30 Minutes Intravenous To ShortStay Surgical 03/30/16 0816 03/30/16 1345      Alphonsa Overall, MD, FACS Pager: [facility] Office: [phone] 04/01/2016

## 2016-04-02 MED ORDER — HYDROCODONE-ACETAMINOPHEN 5-325 MG PO TABS: 1.0000 | ORAL_TABLET | Freq: Four times a day (QID) | ORAL | 0 refills | Status: DC | PRN

## 2016-04-02 NOTE — Discharge Summary (Signed)
Physician Discharge Summary  Patient ID:  [name]  MRN: [id number]  DOB/AGE: [birth year] 66 y.o.  Admit date: 03/30/2016 Discharge date: 04/02/2016  Discharge Diagnoses:   Principal Problem:   Polyp of ascending colon Active Problems:   Colon polyps   Operation: Procedure(s): RIGHT COLECTOMY on 03/30/2016 Harlow Asa  Discharged Condition: good  Hospital Course: [name] is an 66 y.o. female whose primary care physician is Aundria Mems, MD and who was admitted 03/30/2016 with a chief complaint of No chief complaint on file. Marland Kitchen   She was brought to the operating room on 03/30/2016 and underwent  RIGHT COLECTOMY.   She has done very well post op.  She is now 3 days post op, taking po's well, and has had a BM. Her pain is well controlled.  The discharge instructions were reviewed with the patient.  Consults: None  Significant Diagnostic Studies: Results for orders placed or performed during the hospital encounter of 03/30/16  CBC  Result Value Ref Range   WBC 18.1 (H) 4.0 - 10.5 K/uL   RBC 4.44 3.87 - 5.11 MIL/uL   Hemoglobin 14.3 12.0 - 15.0 g/dL   HCT 41.9 36.0 - 46.0 %   MCV 94.4 78.0 - 100.0 fL   MCH 32.2 26.0 - 34.0 pg   MCHC 34.1 30.0 - 36.0 g/dL   RDW 12.1 11.5 - 15.5 %   Platelets 302 150 - 400 K/uL  Creatinine, serum  Result Value Ref Range   Creatinine, Ser 0.96 0.44 - 1.00 mg/dL   GFR calc non Af Amer >60 >60 mL/min   GFR calc Af Amer >60 >60 mL/min    Dg Inject Diag/thera/inc Needle/cath/plc Epi/cerv/thor W/img  Result Date: 03/15/2016 CLINICAL DATA:  Cervical spondylosis without myelopathy. RIGHT arm radicular symptoms predominate. MRI demonstrates mild multilevel disease without disc extrusion or spinal stenosis. FLUOROSCOPY TIME:  21 seconds corresponding to a Dose Area Product of 49.21 Gy*m2 PROCEDURE: Informed written consent was obtained.  Time-out was performed. An appropriate skin entry site was chosen, cleansed with Betadine, and  anesthetized with 1% lidocaine. CERVICAL EPIDURAL INJECTION An interlaminar approach was performed on the RIGHT at C7-T1 . A 20 gauge epidural needle was advanced using loss-of-resistance technique. DIAGNOSTIC EPIDURAL INJECTION Injection of Isovue-M 300 shows a good epidural pattern with spread above and below the level of needle placement, primarily on the RIGHT. No vascular opacification is seen. THERAPEUTIC EPIDURAL INJECTION 1.5 ml of Kenalog 40 mixed with 1 ml of 1% Lidocaine and 2 ml of normal saline were then instilled. The procedure was well-tolerated, and the patient was discharged thirty minutes following the injection in good condition. IMPRESSION: Technically successful first epidural injection on the RIGHT at C7-T1. Electronically Signed   By: Staci Righter M.D.   On: 03/15/2016 15:20    Discharge Exam:  Vitals:   04/01/16 2110 04/02/16 0611  BP: 129/69 (!) 142/73  Pulse: 72 63  Resp: 18 17  Temp: 98.2 F (36.8 C) 98.3 F (36.8 C)    General: WN WF who is alert and generally healthy appearing.  Lungs: Clear to auscultation and symmetric breath sounds. Heart:  RRR. No murmur or rub. Abdomen: Soft. No mass.  Normal bowel sounds. Right abdominal transverse incision looks good.  Discharge Medications:   Allergies as of 04/02/2016      Reactions   Hydrochlorothiazide W-triamterene Other (See Comments)   hypokalemia   Povidone-iodine Itching   Intolerance, if exposed over a long period.  Ok if  used and washed off quickly.      Medication List    TAKE these medications   b complex vitamins tablet Take by mouth.   calcium carbonate 600 MG tablet Commonly known as:  OS-CAL Take 600 mg by mouth 2 (two) times daily with a meal.   cholecalciferol 1000 units tablet Commonly known as:  VITAMIN D Take 1,000 Units by mouth daily.   famotidine 10 MG chewable tablet Commonly known as:  PEPCID AC Chew 10 mg by mouth as needed.   fexofenadine 180 MG tablet Commonly known as:   ALLEGRA Take 1 tablet (180 mg total) by mouth daily.   Fish Oil 1200 MG Caps Take 1 capsule by mouth daily.   Hyaluronan 30 MG/2ML Sosy Commonly known as:  ORTHOVISC Inject 1 Syringe into the articular space once a week.   HYDROcodone-acetaminophen 5-325 MG tablet Commonly known as:  NORCO/VICODIN Take 1-2 tablets by mouth every 4 (four) hours as needed for moderate pain.   HYDROcodone-acetaminophen 5-325 MG tablet Commonly known as:  NORCO/VICODIN Take 1-2 tablets by mouth every 6 (six) hours as needed for moderate pain.   meloxicam 15 MG tablet Commonly known as:  MOBIC TAKE ONE TABLET BY MOUTH ONCE DAILY   MINIVELLE 0.1 MG/24HR patch Generic drug:  estradiol Place 1 patch onto the skin 2 (two) times a week.   pravastatin 20 MG tablet Commonly known as:  PRAVACHOL Take 1 tablet (20 mg total) by mouth daily.   spironolactone 25 MG tablet Commonly known as:  ALDACTONE Take 1 tablet (25 mg total) by mouth 2 (two) times daily.   tiZANidine 4 MG tablet Commonly known as:  ZANAFLEX Take 1 tablet (4 mg total) by mouth every 6 (six) hours as needed for muscle spasms.   traMADol 50 MG tablet Commonly known as:  ULTRAM TAKE ONE TO TWO TABLETS BY MOUTH EVERY 8 HOURS MAXIMUM  6  TABLETS  PER  DAY   Vitamin K (Phytonadione) 100 MCG Tabs Take 1 tablet by mouth daily.   zolpidem 5 MG tablet Commonly known as:  AMBIEN TAKE ONE TABLET BY MOUTH EVERY 3RD NIGHT PRN AT BETIME What changed:  how much to take  how to take this  when to take this  reasons to take this  additional instructions       Disposition: 01-Home or Self Care  Discharge Instructions    Diet - low sodium heart healthy    Complete by:  As directed    Increase activity slowly    Complete by:  As directed       Follow-up Information    GERKIN,TODD M, MD. Schedule an appointment as soon as possible for a visit in 1 week(s).   Specialty:  General Surgery Why:  for wound check and staple  removal Contact information: [place] [address] [phone]            Signed: Alphonsa Overall, M.D., [facility] Office:  [phone]  04/02/2016, 7:54 AM

## 2016-04-04 NOTE — Progress Notes (Signed)
Please contact patient and notify of benign pathology results.  Nicoli Nardozzi M. Sarayah Bacchi, MD, FACS [facility] Office: [phone]   

## 2016-04-17 ENCOUNTER — Ambulatory Visit (INDEPENDENT_AMBULATORY_CARE_PROVIDER_SITE_OTHER): Payer: PRIVATE HEALTH INSURANCE | Admitting: Sports Medicine

## 2016-04-17 ENCOUNTER — Encounter: Payer: Self-pay | Admitting: Sports Medicine

## 2016-04-17 DIAGNOSIS — M170 Bilateral primary osteoarthritis of knee: Secondary | ICD-10-CM

## 2016-04-17 DIAGNOSIS — M5412 Radiculopathy, cervical region: Secondary | ICD-10-CM | POA: Diagnosis not present

## 2016-04-17 NOTE — Assessment & Plan Note (Signed)
Completely resolved now after her epidural a month and a half ago.

## 2016-04-17 NOTE — Progress Notes (Signed)
  Subjective:    CC: Follow-up  HPI: Knee osteoarthritis: Right knee is now pain-free after Orthovisc, left knee continues to be pain free after Orthovisc over a year ago.  Cervical radiculopathy: Resolved after epidural  Status post hemicolectomy: Doing well, stooling is returning to normal. Pathology was negative for any cancer in the colon specimen.  Past medical history:  Negative.  See flowsheet/record as well for more information.  Surgical history: Negative.  See flowsheet/record as well for more information.  Family history: Negative.  See flowsheet/record as well for more information.  Social history: Negative.  See flowsheet/record as well for more information.  Allergies, and medications have been entered into the medical record, reviewed, and no changes needed.   Review of Systems: No fevers, chills, night sweats, weight loss, chest pain, or shortness of breath.   Objective:    General: Well Developed, well nourished, and in no acute distress.  Neuro: Alert and oriented x3, extra-ocular muscles intact, sensation grossly intact.  HEENT: Normocephalic, atraumatic, pupils equal round reactive to light, neck supple, no masses, no lymphadenopathy, thyroid nonpalpable.  Skin: Warm and dry, no rashes. Cardiac: Regular rate and rhythm, no murmurs rubs or gallops, no lower extremity edema.  Respiratory: Clear to auscultation bilaterally. Not using accessory muscles, speaking in full sentences. Right Knee: Normal to inspection with no erythema or effusion or obvious bony abnormalities. Palpation normal with no warmth or joint line tenderness or patellar tenderness or condyle tenderness. ROM normal in flexion and extension and lower leg rotation. Ligaments with solid consistent endpoints including ACL, PCL, LCL, MCL. Negative Mcmurray's and provocative meniscal tests. Non painful patellar compression. Patellar and quadriceps tendons unremarkable. Hamstring and quadriceps strength  is normal.  Impression and Recommendations:    Primary osteoarthritis of both knees Right knee is pain-free after Orthovisc, left knee continues to do well after previous series. Return as needed.  Right cervical radiculopathy Completely resolved now after her epidural a month and a half ago.

## 2016-04-17 NOTE — Assessment & Plan Note (Signed)
Right knee is pain-free after Orthovisc, left knee continues to do well after previous series. Return as needed.

## 2016-05-19 ENCOUNTER — Other Ambulatory Visit: Payer: Self-pay | Admitting: Sports Medicine

## 2016-05-19 DIAGNOSIS — Z1231 Encounter for screening mammogram for malignant neoplasm of breast: Secondary | ICD-10-CM

## 2016-05-31 ENCOUNTER — Encounter: Payer: Self-pay | Admitting: Sports Medicine

## 2016-05-31 DIAGNOSIS — M5412 Radiculopathy, cervical region: Secondary | ICD-10-CM

## 2016-05-31 NOTE — Telephone Encounter (Signed)
Please go ahead and call [facility] [facility] to schedule.

## 2016-06-14 ENCOUNTER — Ambulatory Visit
Admission: RE | Admit: 2016-06-14 | Discharge: 2016-06-14 | Disposition: A | Discharge status: Home / Self Care | Payer: PRIVATE HEALTH INSURANCE | Source: Ambulatory Visit | Attending: Sports Medicine | Admitting: Sports Medicine

## 2016-06-14 MED ORDER — IOPAMIDOL (ISOVUE-M 200) INJECTION 41%
1.0000 mL | Freq: Once | INTRAMUSCULAR | Status: AC
  Administered 2016-06-14: 1 mL via EPIDURAL

## 2016-06-14 MED ORDER — TRIAMCINOLONE ACETONIDE 40 MG/ML IJ SUSP (RADIOLOGY)
60.0000 mg | Freq: Once | INTRAMUSCULAR | Status: AC
  Administered 2016-06-14: 60 mg via EPIDURAL

## 2016-06-15 ENCOUNTER — Other Ambulatory Visit: Payer: Self-pay | Admitting: Sports Medicine

## 2016-06-15 DIAGNOSIS — I10 Essential (primary) hypertension: Secondary | ICD-10-CM

## 2016-06-26 ENCOUNTER — Ambulatory Visit
Admission: RE | Admit: 2016-06-26 | Discharge: 2016-06-26 | Disposition: A | Discharge status: Home / Self Care | Payer: PRIVATE HEALTH INSURANCE | Source: Ambulatory Visit | Attending: Sports Medicine | Admitting: Sports Medicine

## 2016-06-26 DIAGNOSIS — Z1231 Encounter for screening mammogram for malignant neoplasm of breast: Secondary | ICD-10-CM

## 2016-08-04 ENCOUNTER — Ambulatory Visit: Payer: PRIVATE HEALTH INSURANCE | Admitting: Sports Medicine

## 2016-08-18 NOTE — Addendum Note (Signed)
Addendum  created 08/18/16 0911 by Rica Koyanagi, MD   Sign clinical note

## 2016-08-23 ENCOUNTER — Encounter: Payer: Self-pay | Admitting: Sports Medicine

## 2016-08-23 ENCOUNTER — Ambulatory Visit (INDEPENDENT_AMBULATORY_CARE_PROVIDER_SITE_OTHER): Payer: PRIVATE HEALTH INSURANCE | Admitting: Sports Medicine

## 2016-08-23 ENCOUNTER — Telehealth: Payer: Self-pay | Admitting: Sports Medicine

## 2016-08-23 DIAGNOSIS — M170 Bilateral primary osteoarthritis of knee: Secondary | ICD-10-CM

## 2016-08-23 NOTE — Assessment & Plan Note (Addendum)
Left knee continues to do well after Orthovisc many months ago, right knee having recurrence of pain, Orthovisc was 6 months ago. Repeat steroid injection today so she can enjoy her beach trip, and I will get her approved again for Orthovisc. She will have a nice bottle of [place] on ice in her cooler.

## 2016-08-23 NOTE — Telephone Encounter (Signed)
-----   Message from Silverio Decamp, MD sent at 08/23/2016  9:09 AM EDT ----- OrthoVisc please [place], right knee only, worked great 6 months ago. ___________________________________________ Gwen Her. Dianah Field, M.D., ABFM., CAQSM. Primary Care and [place] Instructor of [place] [facility]

## 2016-08-23 NOTE — Telephone Encounter (Signed)
Submitted for approval on Orthovisc. Awaiting confirmation.  

## 2016-08-23 NOTE — Progress Notes (Addendum)
  Subjective:    CC: Right knee pain  HPI: This is a pleasant 66 year old female with right knee osteoarthritis, she finished Orthovisc 6 months ago, now having recurrence of pain, moderate, persistent, localized at the medial joint line with stiffness, and no mechanical symptoms. She does have a trip coming up to the beach this weekend.  Past medical history:  Negative.  See flowsheet/record as well for more information.  Surgical history: Negative.  See flowsheet/record as well for more information.  Family history: Negative.  See flowsheet/record as well for more information.  Social history: Negative.  See flowsheet/record as well for more information.  Allergies, and medications have been entered into the medical record, reviewed, and no changes needed.   Review of Systems: No fevers, chills, night sweats, weight loss, chest pain, or shortness of breath.   Objective:    General: Well Developed, well nourished, and in no acute distress.  Neuro: Alert and oriented x3, extra-ocular muscles intact, sensation grossly intact.  HEENT: Normocephalic, atraumatic, pupils equal round reactive to light, neck supple, no masses, no lymphadenopathy, thyroid nonpalpable.  Skin: Warm and dry, no rashes. Cardiac: Regular rate and rhythm, no murmurs rubs or gallops, no lower extremity edema.  Respiratory: Clear to auscultation bilaterally. Not using accessory muscles, speaking in full sentences. Right Knee: Only minimally swollen with medial joint line pain. ROM normal in flexion and extension and lower leg rotation. Ligaments with solid consistent endpoints including ACL, PCL, LCL, MCL. Negative Mcmurray's and provocative meniscal tests. Non painful patellar compression. Patellar and quadriceps tendons unremarkable. Hamstring and quadriceps strength is normal.  Procedure: Real-time Ultrasound Guided Injection of right knee Device: GE Logiq E  Verbal informed consent obtained.  Time-out  conducted.  Noted no overlying erythema, induration, or other signs of local infection.  Skin prepped in a sterile fashion.  Local anesthesia: Topical Ethyl chloride.  With sterile technique and under real time ultrasound guidance:  1 mL kenalog 40, 2 mL lidocaine, 2 mL bupivacaine injected easily. Completed without difficulty  Pain immediately resolved suggesting accurate placement of the medication.  Advised to call if fevers/chills, erythema, induration, drainage, or persistent bleeding.  Images permanently stored and available for review in the ultrasound unit.  Impression: Technically successful ultrasound guided injection.  Impression and Recommendations:    Primary osteoarthritis of both knees Left knee continues to do well after Orthovisc many months ago, right knee having recurrence of pain, Orthovisc was 6 months ago. Repeat steroid injection today so she can enjoy her beach trip, and I will get her approved again for Orthovisc. She will have a nice bottle of [place] on ice in her cooler.

## 2016-09-07 ENCOUNTER — Other Ambulatory Visit: Payer: Self-pay | Admitting: Sports Medicine

## 2016-09-09 ENCOUNTER — Other Ambulatory Visit: Payer: Self-pay | Admitting: Sports Medicine

## 2016-09-12 ENCOUNTER — Encounter: Payer: Self-pay | Admitting: Sports Medicine

## 2016-09-12 NOTE — Telephone Encounter (Signed)
Received the following information from OV benefits investigation:   Orthovisc is covered. Buy and bill is not allowed. After the deductible has been met, insurance will cover 100% of the allowable amount. The our of pocket has been met and insurance will cover 100% if the allowable amount. The product can only be obtained via the pharmacy option under the medical benefit through [facility]) Rx. Call reference number is [id number].   The speciality pharmacy required additional clinical information. Call pharmacy at [phone], spoke with the Pharmacist, Tom. Provided diagnosis code, and Providers information.

## 2016-09-13 NOTE — Telephone Encounter (Signed)
Received PA form, form completed and faxed back.

## 2016-09-18 ENCOUNTER — Telehealth: Payer: Self-pay | Admitting: Sports Medicine

## 2016-09-18 MED ORDER — SODIUM HYALURONATE (VISCOSUP) 20 MG/2ML IX SOSY: PREFILLED_SYRINGE | INTRA_ARTICULAR | 0 refills | Status: DC

## 2016-09-18 NOTE — Addendum Note (Signed)
Addended by: Silverio Decamp on: 09/18/2016 08:59 AM   Modules accepted: Orders

## 2016-09-18 NOTE — Telephone Encounter (Signed)
Looks like pharmacy is no longer going to cover Orthovisc and are requesting one of the other preferred agents, I have sent in the Euflexxa with enough syringes for both knees.  Triage always manages the rest of the authorization and coordinates delivery/appointments.

## 2016-09-18 NOTE — Telephone Encounter (Signed)
error 

## 2016-09-21 ENCOUNTER — Ambulatory Visit: Payer: PRIVATE HEALTH INSURANCE | Admitting: Sports Medicine

## 2016-09-28 ENCOUNTER — Ambulatory Visit: Payer: PRIVATE HEALTH INSURANCE | Admitting: Sports Medicine

## 2016-09-28 ENCOUNTER — Encounter: Payer: Self-pay | Admitting: Sports Medicine

## 2016-09-29 ENCOUNTER — Telehealth: Payer: Self-pay | Admitting: *Deleted

## 2016-09-29 NOTE — Telephone Encounter (Signed)
Received PA for orthovisc.  Faxed with pt's OV notes, x rays.

## 2016-09-29 NOTE — Telephone Encounter (Signed)
PA submitted, ref # [id number].[name] [place]

## 2016-10-05 ENCOUNTER — Ambulatory Visit: Payer: PRIVATE HEALTH INSURANCE | Admitting: Sports Medicine

## 2016-10-05 NOTE — Telephone Encounter (Signed)
Euflexxa approved.  Case#: [id number]

## 2016-10-05 NOTE — Telephone Encounter (Signed)
Awesome, will they be mailing it to [place]?

## 2016-10-09 ENCOUNTER — Other Ambulatory Visit: Payer: Self-pay

## 2016-10-09 DIAGNOSIS — M170 Bilateral primary osteoarthritis of knee: Secondary | ICD-10-CM

## 2016-10-09 MED ORDER — SODIUM HYALURONATE (VISCOSUP) 20 MG/2ML IX SOSY: PREFILLED_SYRINGE | INTRA_ARTICULAR | 0 refills | Status: DC

## 2016-10-09 NOTE — Progress Notes (Signed)
Pt called clinic, states insurance approved Euflexxa. Request it be sent to [facility].

## 2016-10-10 NOTE — Telephone Encounter (Signed)
Spoke with speciality pharmacy and they will not ship out injections until they hear back from patient. -EH/RMA

## 2016-10-11 NOTE — Telephone Encounter (Signed)
Received call from pt stating she has contacted specialty pharmacy and verbalized order. Called pharmacy again today and request was initiated again for shipping and injections are scheduled to arrive 10/12/16.

## 2016-10-12 ENCOUNTER — Ambulatory Visit: Payer: PRIVATE HEALTH INSURANCE | Admitting: Sports Medicine

## 2016-10-12 NOTE — Telephone Encounter (Signed)
Injections received and pt notified. Appointments booked.

## 2016-10-20 ENCOUNTER — Ambulatory Visit (INDEPENDENT_AMBULATORY_CARE_PROVIDER_SITE_OTHER): Payer: PRIVATE HEALTH INSURANCE | Admitting: Sports Medicine

## 2016-10-20 ENCOUNTER — Encounter: Payer: Self-pay | Admitting: Sports Medicine

## 2016-10-20 DIAGNOSIS — M170 Bilateral primary osteoarthritis of knee: Secondary | ICD-10-CM

## 2016-10-20 NOTE — Progress Notes (Signed)
   Procedure: Real-time Ultrasound Guided Injection of right knee Device: GE Logiq E  Verbal informed consent obtained.  Time-out conducted.  Noted no overlying erythema, induration, or other signs of local infection.  Skin prepped in a sterile fashion.  Local anesthesia: Topical Ethyl chloride.  With sterile technique and under real time ultrasound guidance:  Euflexxa syringe injected easily through a 22-gauge needle into the right suprapatellar recess. Completed without difficulty  Pain immediately resolved suggesting accurate placement of the medication.  Advised to call if fevers/chills, erythema, induration, drainage, or persistent bleeding.  Images permanently stored and available for review in the ultrasound unit.  Impression: Technically successful ultrasound guided injection.

## 2016-10-20 NOTE — Assessment & Plan Note (Signed)
Euflexxa No. 1 of 3 into the right knee. Return in one week for Euflexxa No. 2, we do have syringes for her left knee, but left knee is not hurting so we will hold off on these injections for now.

## 2016-10-27 ENCOUNTER — Ambulatory Visit (INDEPENDENT_AMBULATORY_CARE_PROVIDER_SITE_OTHER): Payer: PRIVATE HEALTH INSURANCE | Admitting: Sports Medicine

## 2016-10-27 DIAGNOSIS — M170 Bilateral primary osteoarthritis of knee: Secondary | ICD-10-CM

## 2016-10-27 NOTE — Assessment & Plan Note (Signed)
Euflexxa injection #2 of 3 into the right knee. Return in one week for #3, we do have the syringes for her left knee but it's not hurting so we are going to hold off. When she comes back I think we'll have her take her box of syringes home.

## 2016-10-27 NOTE — Progress Notes (Signed)
   Procedure: Real-time Ultrasound Guided Injection of right knee Device: GE Logiq E  Verbal informed consent obtained.  Time-out conducted.  Noted no overlying erythema, induration, or other signs of local infection.  Skin prepped in a sterile fashion.  Local anesthesia: Topical Ethyl chloride.  With sterile technique and under real time ultrasound guidance:  Euflexxa syringe injected easily through a 22-gauge needle into the right suprapatellar recess. Completed without difficulty  Pain immediately resolved suggesting accurate placement of the medication.  Advised to call if fevers/chills, erythema, induration, drainage, or persistent bleeding.  Images permanently stored and available for review in the ultrasound unit.  Impression: Technically successful ultrasound guided injection.

## 2016-11-03 ENCOUNTER — Encounter: Payer: Self-pay | Admitting: Sports Medicine

## 2016-11-03 ENCOUNTER — Ambulatory Visit (INDEPENDENT_AMBULATORY_CARE_PROVIDER_SITE_OTHER): Payer: PRIVATE HEALTH INSURANCE | Admitting: Sports Medicine

## 2016-11-03 DIAGNOSIS — M170 Bilateral primary osteoarthritis of knee: Secondary | ICD-10-CM

## 2016-11-03 NOTE — Assessment & Plan Note (Signed)
Third and final Euflexxa injection into the right knee today, doing well. We will hold onto her Euflexxa Syringes for her left knee in our cabinet.

## 2016-11-03 NOTE — Progress Notes (Signed)
   Procedure: Real-time Ultrasound Guided Injection of right knee Device: GE Logiq E  Verbal informed consent obtained.  Time-out conducted.  Noted no overlying erythema, induration, or other signs of local infection.  Skin prepped in a sterile fashion.  Local anesthesia: Topical Ethyl chloride.  With sterile technique and under real time ultrasound guidance:  Euflexxa syringe injected easily through a 22-gauge needle into the right suprapatellar recess. Completed without difficulty  Pain immediately resolved suggesting accurate placement of the medication.  Advised to call if fevers/chills, erythema, induration, drainage, or persistent bleeding.  Images permanently stored and available for review in the ultrasound unit.  Impression: Technically successful ultrasound guided injection.

## 2016-11-28 ENCOUNTER — Encounter: Payer: Self-pay | Admitting: Sports Medicine

## 2016-11-28 DIAGNOSIS — M5030 Other cervical disc degeneration, unspecified cervical region: Secondary | ICD-10-CM

## 2016-12-08 ENCOUNTER — Ambulatory Visit
Admission: RE | Admit: 2016-12-08 | Discharge: 2016-12-08 | Disposition: A | Discharge status: Home / Self Care | Payer: PRIVATE HEALTH INSURANCE | Source: Ambulatory Visit | Attending: Sports Medicine | Admitting: Sports Medicine

## 2016-12-08 MED ORDER — IOPAMIDOL (ISOVUE-M 300) INJECTION 61%
1.0000 mL | Freq: Once | INTRAMUSCULAR | Status: AC | PRN
  Administered 2016-12-08: 1 mL via EPIDURAL

## 2016-12-08 MED ORDER — TRIAMCINOLONE ACETONIDE 40 MG/ML IJ SUSP (RADIOLOGY)
60.0000 mg | Freq: Once | INTRAMUSCULAR | Status: AC
  Administered 2016-12-08: 60 mg via EPIDURAL

## 2017-01-26 ENCOUNTER — Encounter: Payer: Self-pay | Admitting: Sports Medicine

## 2017-01-26 ENCOUNTER — Ambulatory Visit
Admission: RE | Admit: 2017-01-26 | Discharge: 2017-01-26 | Disposition: A | Discharge status: Home / Self Care | Payer: PRIVATE HEALTH INSURANCE | Source: Ambulatory Visit | Attending: Sports Medicine | Admitting: Sports Medicine

## 2017-01-26 DIAGNOSIS — M5412 Radiculopathy, cervical region: Secondary | ICD-10-CM

## 2017-01-26 MED ORDER — TRIAMCINOLONE ACETONIDE 40 MG/ML IJ SUSP (RADIOLOGY)
60.0000 mg | Freq: Once | INTRAMUSCULAR | Status: AC
  Administered 2017-01-26: 60 mg via EPIDURAL

## 2017-01-26 MED ORDER — IOPAMIDOL (ISOVUE-M 300) INJECTION 61%
1.0000 mL | Freq: Once | INTRAMUSCULAR | Status: AC | PRN
  Administered 2017-01-26: 1 mL via EPIDURAL

## 2017-02-05 ENCOUNTER — Other Ambulatory Visit: Payer: Self-pay | Admitting: Sports Medicine

## 2017-02-15 LAB — HM COLONOSCOPY

## 2017-03-05 ENCOUNTER — Encounter: Payer: Self-pay | Admitting: *Deleted

## 2017-03-06 ENCOUNTER — Ambulatory Visit (INDEPENDENT_AMBULATORY_CARE_PROVIDER_SITE_OTHER): Payer: PRIVATE HEALTH INSURANCE | Admitting: Sports Medicine

## 2017-03-06 DIAGNOSIS — M7552 Bursitis of left shoulder: Secondary | ICD-10-CM

## 2017-03-06 DIAGNOSIS — M7551 Bursitis of right shoulder: Secondary | ICD-10-CM | POA: Diagnosis not present

## 2017-03-06 NOTE — Assessment & Plan Note (Signed)
Bilateral subacromial bursitis and impingement type symptoms. Previous injection in the subcoracoid space provided a year and a half of relief. Recurrence of pain, this time classically subacromial, bilateral subacromial injections, rehab exercises given. Return to see me in 1 month.

## 2017-03-06 NOTE — Progress Notes (Signed)
Subjective:    CC: Bilateral shoulder pain  HPI: This is a pleasant 66 year old female, she has bilateral pain in her shoulders over the deltoids, worse with overhead activities, moderate, persistent without radiation.  No trauma.  Present now for the past several weeks.  She responded well to a right subcoracoid space injection several months ago.  Past medical history:  Negative.  See flowsheet/record as well for more information.  Surgical history: Negative.  See flowsheet/record as well for more information.  Family history: Negative.  See flowsheet/record as well for more information.  Social history: Negative.  See flowsheet/record as well for more information.  Allergies, and medications have been entered into the medical record, reviewed, and no changes needed.   (To billers/coders, pertinent past medical, social, surgical, family history can be found in problem list, if problem list is marked as reviewed then this indicates that past medical, social, surgical, family history was also reviewed)  Review of Systems: No fevers, chills, night sweats, weight loss, chest pain, or shortness of breath.   Objective:    General: Well Developed, well nourished, and in no acute distress.  Neuro: Alert and oriented x3, extra-ocular muscles intact, sensation grossly intact.  HEENT: Normocephalic, atraumatic, pupils equal round reactive to light, neck supple, no masses, no lymphadenopathy, thyroid nonpalpable.  Skin: Warm and dry, no rashes. Cardiac: Regular rate and rhythm, no murmurs rubs or gallops, no lower extremity edema.  Respiratory: Clear to auscultation bilaterally. Not using accessory muscles, speaking in full sentences. Bilateral shoulders: Inspection reveals no abnormalities, atrophy or asymmetry. Palpation is normal with no tenderness over AC joint or bicipital groove. ROM is full in all planes. Rotator cuff strength normal throughout. Positive Neer and Hawkin's tests, empty  can. Speeds and Yergason's tests normal. No labral pathology noted with negative Obrien's, negative crank, negative clunk, and good stability. Normal scapular function observed. No painful arc and no drop arm sign. No apprehension sign  Procedure: Real-time Ultrasound Guided Injection of left subacromial bursa Device: GE Logiq E  Verbal informed consent obtained.  Time-out conducted.  Noted no overlying erythema, induration, or other signs of local infection.  Skin prepped in a sterile fashion.  Local anesthesia: Topical Ethyl chloride.  With sterile technique and under real time ultrasound guidance: 1 cc kenalog 40, 1 cc lidocaine, 1 cc bupivacaine injected easily Completed without difficulty  Pain immediately resolved suggesting accurate placement of the medication.  Advised to call if fevers/chills, erythema, induration, drainage, or persistent bleeding.  Images permanently stored and available for review in the ultrasound unit.  Impression: Technically successful ultrasound guided injection.  Procedure: Real-time Ultrasound Guided Injection of right subacromial bursa Device: GE Logiq E  Verbal informed consent obtained.  Time-out conducted.  Noted no overlying erythema, induration, or other signs of local infection.  Skin prepped in a sterile fashion.  Local anesthesia: Topical Ethyl chloride.  With sterile technique and under real time ultrasound guidance: 1 cc kenalog 40, 1 cc lidocaine, 1 cc bupivacaine injected easily Completed without difficulty  Pain immediately resolved suggesting accurate placement of the medication.  Advised to call if fevers/chills, erythema, induration, drainage, or persistent bleeding.  Images permanently stored and available for review in the ultrasound unit.  Impression: Technically successful ultrasound guided injection.  Impression and Recommendations:    Bilateral shoulder bursitis Bilateral subacromial bursitis and impingement type  symptoms. Previous injection in the subcoracoid space provided a year and a half of relief. Recurrence of pain, this time classically subacromial, bilateral subacromial  injections, rehab exercises given. Return to see me in 1 month. ___________________________________________ Gwen Her. Dianah Field, M.D., ABFM., CAQSM. Primary Care and [place] Instructor of [place] of [facility] of Medicine

## 2017-04-09 ENCOUNTER — Ambulatory Visit (INDEPENDENT_AMBULATORY_CARE_PROVIDER_SITE_OTHER): Payer: PRIVATE HEALTH INSURANCE | Admitting: Sports Medicine

## 2017-04-09 ENCOUNTER — Encounter: Payer: Self-pay | Admitting: Sports Medicine

## 2017-04-09 DIAGNOSIS — M791 Myalgia, unspecified site: Secondary | ICD-10-CM | POA: Diagnosis not present

## 2017-04-09 DIAGNOSIS — M47816 Spondylosis without myelopathy or radiculopathy, lumbar region: Secondary | ICD-10-CM

## 2017-04-09 DIAGNOSIS — L219 Seborrheic dermatitis, unspecified: Secondary | ICD-10-CM

## 2017-04-09 DIAGNOSIS — R635 Abnormal weight gain: Secondary | ICD-10-CM | POA: Insufficient documentation

## 2017-04-09 MED ORDER — DOXYCYCLINE HYCLATE 100 MG PO TABS: 100.0000 mg | ORAL_TABLET | Freq: Two times a day (BID) | ORAL | 0 refills | Status: AC

## 2017-04-09 NOTE — Assessment & Plan Note (Signed)
Patient can call when she is ready to try phentermine.

## 2017-04-09 NOTE — Progress Notes (Addendum)
Subjective:    CC: Back pain, skin rash  HPI: [name] is a pleasant 67 year old female, she has known SI joint dysfunction, we injected her over a year ago, she is having a recurrence of pain, left sacroiliac joint desires repeat injection today, pain is moderate, persistent, localized without radiation past the buttock.  Skin rash: Erythematous, minimally pruritic in the nasolabial folds bilaterally.  She tells me she is tried hydrocortisone, saw a dermatologist who recommended oral doxycycline which seemed to clear it, she is requesting that we do oral doxycycline.  I reviewed the past medical history, family history, social history, surgical history, and allergies today and no changes were needed.  Please see the problem list section below in epic for further details.  Past Medical History: Past Medical History:  Diagnosis Date  . Abnormal vaginal Pap smear    monitored annually by Dr. Dory Horn  . Colon polyps    multple right colon polyps  . Degenerative joint disease   . GERD (gastroesophageal reflux disease)   . Hyperlipidemia    NMR: LDL 120 (1267/584), HDL 59, TG 60. LDL goall<130, ideally <100  . Hypertension   . Hypokalemia    on HCTZ/Triam  . PONV (postoperative nausea and vomiting)   . Vitamin D deficiency    2000 IU   Past Surgical History: Past Surgical History:  Procedure Laterality Date  . ABDOMINAL HYSTERECTOMY    . APPENDECTOMY    . BREAST SURGERY     left breast biopsy  . BUNIONECTOMY  03/2009  . CHOLECYSTECTOMY  09/2007   Lap, Dr.Gerkin   . COLONOSCOPY  2008   virtual colonoscopy  . DILATION AND CURETTAGE OF UTERUS    . ENDOMETRIAL BIOPSY  04-28-13   Dr Nori Riis  . ENDOSCOPIC CONCHA BULLOSA RESECTION Left 03/24/2013   Procedure: ENDOSCOPIC CONCHA BULLOSA RESECTION;  Surgeon: Izora Gala, MD;  Location: [place];  Service: ENT;  Laterality: Left;  . ETHMOIDECTOMY Left 03/24/2013   Procedure: ETHMOIDECTOMY;  Surgeon: Izora Gala, MD;  Location:  [place];  Service: ENT;  Laterality: Left;  . HAMMER TOE SURGERY    . HYSTEROSCOPY     w/ D and C in 09/2005,02/2007,03/2008 (+ poly removal); IDU insertion 09/2008, Dr.Neal  . NEURECTOMY FOOT  03/2008   left  . NEUROMA SURGERY     left foot ,Dr.Sikora  . PARTIAL COLECTOMY Right 03/30/2016   Procedure: RIGHT COLECTOMY;  Surgeon: Armandina Gemma, MD;  Location: [place];  Service: General;  Laterality: Right;  . TOE SURGERY     for bone spur  . TUBAL LIGATION     Social History: Social History   Socioeconomic History  . Marital status: Married    Spouse name: None  . Number of children: None  . Years of education: None  . Highest education level: None  Social Needs  . Financial resource strain: None  . Food insecurity - worry: None  . Food insecurity - inability: None  . Transportation needs - medical: None  . Transportation needs - non-medical: None  Occupational History  . Occupation: Marine scientist, [employer]   Tobacco Use  . Smoking status: Never Smoker  . Smokeless tobacco: Never Used  Substance and Sexual Activity  . Alcohol use: Yes    Comment: occasional  . Drug use: No  . Sexual activity: None  Other Topics Concern  . None  Social History Narrative   Heart Healthy Diet  Family History: Family History  Problem Relation Age of Onset  . Heart attack Father 34       CABG 4-vessel  . Diabetes Father        late onset  . Lung cancer Mother        former smoker  . Diabetes Mother        late onset  . Breast cancer Mother   . Obesity Sister   . Stroke Paternal Grandfather 81  . Lung cancer Maternal Grandfather        former smoker  . Breast cancer Maternal Aunt   . Heart attack Paternal Grandmother 64  . Diabetes type I Unknown        grand daughter   Allergies: Allergies  Allergen Reactions  . Hydrochlorothiazide W-Triamterene Other (See Comments)    hypokalemia  . Povidone-Iodine Itching    Intolerance, if exposed over a long period.   Ok if used and washed off quickly.   Medications: See med rec.  Review of Systems: No fevers, chills, night sweats, weight loss, chest pain, or shortness of breath.   Objective:    General: Well Developed, well nourished, and in no acute distress.  Neuro: Alert and oriented x3, extra-ocular muscles intact, sensation grossly intact.  HEENT: Normocephalic, atraumatic, pupils equal round reactive to light, neck supple, no masses, no lymphadenopathy, thyroid nonpalpable.  Skin: Warm and dry. Minimally erythematous rash in the nasolabial folds consistent with seborrheic dermatitis Cardiac: Regular rate and rhythm, no murmurs rubs or gallops, no lower extremity edema.  Respiratory: Clear to auscultation bilaterally. Not using accessory muscles, speaking in full sentences. Back Exam:  Inspection: Unremarkable  Motion: Flexion 45 deg, Extension 45 deg, Side Bending to 45 deg bilaterally,  Rotation to 45 deg bilaterally  SLR laying: Negative  XSLR laying: Negative  Palpable tenderness: Left sacroiliac joint. FABER: negative. Sensory change: Gross sensation intact to all lumbar and sacral dermatomes.  Reflexes: 2+ at both patellar tendons, 2+ at achilles tendons, Babinski's downgoing.  Strength at foot  Plantar-flexion: 5/5 Dorsi-flexion: 5/5 Eversion: 5/5 Inversion: 5/5  Leg strength  Quad: 5/5 Hamstring: 5/5 Hip flexor: 5/5 Hip abductors: 5/5  Gait unremarkable.  Procedure: Real-time Ultrasound Guided Injection of left sacroiliac joint Device: GE Logiq E  Verbal informed consent obtained.  Time-out conducted.  Noted no overlying erythema, induration, or other signs of local infection.  Skin prepped in a sterile fashion.  Local anesthesia: Topical Ethyl chloride.  With sterile technique and under real time ultrasound guidance: Using a 22-gauge spinal needle I injected 1 cc kenalog 40, 2 cc lidocaine, 2 cc bupivacaine. Completed without difficulty  Pain immediately resolved suggesting  accurate placement of the medication.  Advised to call if fevers/chills, erythema, induration, drainage, or persistent bleeding.  Images permanently stored and available for review in the ultrasound unit.  Impression: Technically successful ultrasound guided injection.  Impression and Recommendations:    Seborrheic dermatitis Advised patient that typically we use a topical steroid. She says her dermatologist has done doxycycline oral and this is the only thing that worked, so we will do doxycycline 100 mg twice a day for 14 days. If this does not work we can add triamcinolone cream.  Abnormal weight gain Patient can call when she is ready to try phentermine.  Lumbar spondylosis with sacroiliac joint dysfunction Left sacroiliac joint injection as above.  Return as needed.   ___________________________________________ Gwen Her. Dianah Field, M.D., ABFM., CAQSM. Primary Care and [place]  Adjunct Instructor of [place] of [facility] [employer]

## 2017-04-09 NOTE — Assessment & Plan Note (Signed)
Advised patient that typically we use a topical steroid. She says her dermatologist has done doxycycline oral and this is the only thing that worked, so we will do doxycycline 100 mg twice a day for 14 days. If this does not work we can add triamcinolone cream.

## 2017-04-20 NOTE — Assessment & Plan Note (Signed)
Left sacroiliac joint injection as above.  Return as needed.

## 2017-05-14 ENCOUNTER — Other Ambulatory Visit: Payer: Self-pay | Admitting: Sports Medicine

## 2017-05-14 DIAGNOSIS — Z1231 Encounter for screening mammogram for malignant neoplasm of breast: Secondary | ICD-10-CM

## 2017-05-17 ENCOUNTER — Encounter: Payer: Self-pay | Admitting: Sports Medicine

## 2017-05-17 ENCOUNTER — Ambulatory Visit (INDEPENDENT_AMBULATORY_CARE_PROVIDER_SITE_OTHER): Payer: PRIVATE HEALTH INSURANCE | Admitting: Sports Medicine

## 2017-05-17 DIAGNOSIS — Z0000 Encounter for general adult medical examination without abnormal findings: Secondary | ICD-10-CM | POA: Diagnosis not present

## 2017-05-17 DIAGNOSIS — E782 Mixed hyperlipidemia: Secondary | ICD-10-CM

## 2017-05-17 DIAGNOSIS — I10 Essential (primary) hypertension: Secondary | ICD-10-CM | POA: Diagnosis not present

## 2017-05-17 NOTE — Progress Notes (Signed)
Subjective:    CC: Annual physical exam  HPI:  This is a pleasant 67 year old female here for her physical, she has no complaints.  I reviewed the past medical history, family history, social history, surgical history, and allergies today and no changes were needed.  Please see the problem list section below in epic for further details.  Past Medical History: Past Medical History:  Diagnosis Date  . Abnormal vaginal Pap smear    monitored annually by Dr. Dory Horn  . Colon polyps    multple right colon polyps  . Degenerative joint disease   . GERD (gastroesophageal reflux disease)   . Hyperlipidemia    NMR: LDL 120 (1267/584), HDL 59, TG 60. LDL goall<130, ideally <100  . Hypertension   . Hypokalemia    on HCTZ/Triam  . PONV (postoperative nausea and vomiting)   . Vitamin D deficiency    2000 IU   Past Surgical History: Past Surgical History:  Procedure Laterality Date  . ABDOMINAL HYSTERECTOMY    . APPENDECTOMY    . BREAST SURGERY     left breast biopsy  . BUNIONECTOMY  03/2009  . CHOLECYSTECTOMY  09/2007   Lap, Dr.Gerkin   . COLONOSCOPY  2008   virtual colonoscopy  . DILATION AND CURETTAGE OF UTERUS    . ENDOMETRIAL BIOPSY  04-28-13   Dr Nori Riis  . ENDOSCOPIC CONCHA BULLOSA RESECTION Left 03/24/2013   Procedure: ENDOSCOPIC CONCHA BULLOSA RESECTION;  Surgeon: Izora Gala, MD;  Location: [place];  Service: ENT;  Laterality: Left;  . ETHMOIDECTOMY Left 03/24/2013   Procedure: ETHMOIDECTOMY;  Surgeon: Izora Gala, MD;  Location: [place];  Service: ENT;  Laterality: Left;  . HAMMER TOE SURGERY    . HYSTEROSCOPY     w/ D and C in 09/2005,02/2007,03/2008 (+ poly removal); IDU insertion 09/2008, Dr.Neal  . NEURECTOMY FOOT  03/2008   left  . NEUROMA SURGERY     left foot ,Dr.Sikora  . PARTIAL COLECTOMY Right 03/30/2016   Procedure: RIGHT COLECTOMY;  Surgeon: Armandina Gemma, MD;  Location: [place];  Service: General;  Laterality: Right;  . TOE SURGERY       for bone spur  . TUBAL LIGATION     Social History: Social History   Socioeconomic History  . Marital status: Married    Spouse name: None  . Number of children: None  . Years of education: None  . Highest education level: None  Social Needs  . Financial resource strain: None  . Food insecurity - worry: None  . Food insecurity - inability: None  . Transportation needs - medical: None  . Transportation needs - non-medical: None  Occupational History  . Occupation: Marine scientist, [employer]   Tobacco Use  . Smoking status: Never Smoker  . Smokeless tobacco: Never Used  Substance and Sexual Activity  . Alcohol use: Yes    Comment: occasional  . Drug use: No  . Sexual activity: None  Other Topics Concern  . None  Social History Narrative   Heart Healthy Diet         Family History: Family History  Problem Relation Age of Onset  . Heart attack Father 68       CABG 4-vessel  . Diabetes Father        late onset  . Lung cancer Mother        former smoker  . Diabetes Mother        late onset  . Breast  cancer Mother   . Obesity Sister   . Stroke Paternal Grandfather 97  . Lung cancer Maternal Grandfather        former smoker  . Breast cancer Maternal Aunt   . Heart attack Paternal Grandmother 35  . Diabetes type I Unknown        grand daughter   Allergies: Allergies  Allergen Reactions  . Hydrochlorothiazide W-Triamterene Other (See Comments)    hypokalemia  . Povidone-Iodine Itching    Intolerance, if exposed over a long period.  Ok if used and washed off quickly.   Medications: See med rec.  Review of Systems: No headache, visual changes, nausea, vomiting, diarrhea, constipation, dizziness, abdominal pain, skin rash, fevers, chills, night sweats, swollen lymph nodes, weight loss, chest pain, body aches, joint swelling, muscle aches, shortness of breath, mood changes, visual or auditory hallucinations.  Objective:    General: Well Developed, well nourished,  and in no acute distress.  Neuro: Alert and oriented x3, extra-ocular muscles intact, sensation grossly intact. Cranial nerves II through XII are intact, motor, sensory, and coordinative functions are all intact. HEENT: Normocephalic, atraumatic, pupils equal round reactive to light, neck supple, no masses, no lymphadenopathy, thyroid nonpalpable. Oropharynx, nasopharynx, external ear canals are unremarkable. Skin: Warm and dry, no rashes noted.  There is a 1.5 x 1.5 cm hyperpigmented patch on her right mandible, this is unchanged. Cardiac: Regular rate and rhythm, no murmurs rubs or gallops.  Respiratory: Clear to auscultation bilaterally. Not using accessory muscles, speaking in full sentences.  Abdominal: Soft, nontender, nondistended, positive bowel sounds, no masses, no organomegaly.  Musculoskeletal: Shoulder, elbow, wrist, hip, knee, ankle stable, and with full range of motion.  Impression and Recommendations:    The patient was counselled, risk factors were discussed, anticipatory guidance given.  Annual physical exam Routine physical as above. Return in 1 year. Adding hepatitis C and HIV screening, up-to-date on other screening measures. She is post total hysterectomy for a nonmalignant cause, so she does not need any more cervical cancer screening.  Essential hypertension, benign Checking routine labs.   Hyperlipidemia Rechecking lipids. ___________________________________________ Gwen Her. Dianah Field, M.D., ABFM., CAQSM. Primary Care and [place] Instructor of [place] of [facility] of Medicine

## 2017-05-17 NOTE — Assessment & Plan Note (Signed)
Checking routine labs 

## 2017-05-17 NOTE — Assessment & Plan Note (Signed)
Routine physical as above. Return in 1 year. Adding hepatitis C and HIV screening, up-to-date on other screening measures. She is post total hysterectomy for a nonmalignant cause, so she does not need any more cervical cancer screening.

## 2017-05-17 NOTE — Assessment & Plan Note (Signed)
Rechecking lipids. 

## 2017-05-19 LAB — CBC
HCT: 43.2 % (ref 35.0–45.0)
Hemoglobin: 15.1 g/dL (ref 11.7–15.5)
MCH: 32.9 pg (ref 27.0–33.0)
MCHC: 35 g/dL (ref 32.0–36.0)
MCV: 94.1 fL (ref 80.0–100.0)
MPV: 9.3 fL (ref 7.5–12.5)
Platelets: 280 10*3/uL (ref 140–400)
RBC: 4.59 10*6/uL (ref 3.80–5.10)
RDW: 11.9 % (ref 11.0–15.0)
WBC: 5 Thousand/uL (ref 3.8–10.8)

## 2017-05-19 LAB — LIPID PANEL W/REFLEX DIRECT LDL
Cholesterol: 194 mg/dL (ref <200)
HDL: 54 mg/dL (ref >50)
LDL Cholesterol (Calc): 115 mg/dL — ABNORMAL HIGH
Non-HDL Cholesterol (Calc): 140 mg/dL — ABNORMAL HIGH (ref <130)
Total CHOL/HDL Ratio: 3.6 (calc) (ref <5.0)
Triglycerides: 130 mg/dL (ref <150)

## 2017-05-19 LAB — COMPREHENSIVE METABOLIC PANEL WITH GFR
ALT: 20 U/L (ref 6–29)
AST: 23 U/L (ref 10–35)
BUN: 16 mg/dL (ref 7–25)
CO2: 31 mmol/L (ref 20–32)
Calcium: 9.6 mg/dL (ref 8.6–10.4)
Glucose, Bld: 94 mg/dL (ref 65–99)
Sodium: 140 mmol/L (ref 135–146)
Total Protein: 6.5 g/dL (ref 6.1–8.1)

## 2017-05-19 LAB — COMPREHENSIVE METABOLIC PANEL
AG Ratio: 1.8 (calc) (ref 1.0–2.5)
Albumin: 4.2 g/dL (ref 3.6–5.1)
Alkaline phosphatase (APISO): 72 U/L (ref 33–130)
Chloride: 104 mmol/L (ref 98–110)
Creat: 0.91 mg/dL (ref 0.50–0.99)
Globulin: 2.3 g/dL (calc) (ref 1.9–3.7)
Potassium: 4.5 mmol/L (ref 3.5–5.3)
Total Bilirubin: 0.6 mg/dL (ref 0.2–1.2)

## 2017-05-19 LAB — HIV ANTIBODY (ROUTINE TESTING W REFLEX): HIV 1&2 Ab, 4th Generation: NONREACTIVE

## 2017-05-19 LAB — HEMOGLOBIN A1C
Hgb A1c MFr Bld: 5.1 % of total Hgb (ref <5.7)
Mean Plasma Glucose: 100 (calc)
eAG (mmol/L): 5.5 (calc)

## 2017-05-19 LAB — HEPATITIS C ANTIBODY
Hepatitis C Ab: NONREACTIVE
SIGNAL TO CUT-OFF: 0.01 (ref <1.00)

## 2017-05-19 LAB — TSH: TSH: 1.06 m[IU]/L (ref 0.40–4.50)

## 2017-06-09 ENCOUNTER — Other Ambulatory Visit: Payer: Self-pay | Admitting: Sports Medicine

## 2017-06-09 DIAGNOSIS — I10 Essential (primary) hypertension: Secondary | ICD-10-CM

## 2017-06-27 ENCOUNTER — Ambulatory Visit
Admission: RE | Admit: 2017-06-27 | Discharge: 2017-06-27 | Disposition: A | Discharge status: Home / Self Care | Payer: PRIVATE HEALTH INSURANCE | Source: Ambulatory Visit | Attending: Sports Medicine | Admitting: Sports Medicine

## 2017-06-27 DIAGNOSIS — Z1231 Encounter for screening mammogram for malignant neoplasm of breast: Secondary | ICD-10-CM

## 2017-06-28 ENCOUNTER — Other Ambulatory Visit: Payer: Self-pay | Admitting: Sports Medicine

## 2017-06-28 DIAGNOSIS — R928 Other abnormal and inconclusive findings on diagnostic imaging of breast: Secondary | ICD-10-CM

## 2017-07-02 ENCOUNTER — Ambulatory Visit: Payer: PRIVATE HEALTH INSURANCE

## 2017-07-02 ENCOUNTER — Ambulatory Visit
Admission: RE | Admit: 2017-07-02 | Discharge: 2017-07-02 | Disposition: A | Discharge status: Home / Self Care | Payer: PRIVATE HEALTH INSURANCE | Source: Ambulatory Visit | Attending: Sports Medicine | Admitting: Sports Medicine

## 2017-07-02 DIAGNOSIS — R928 Other abnormal and inconclusive findings on diagnostic imaging of breast: Secondary | ICD-10-CM

## 2017-07-09 ENCOUNTER — Encounter: Payer: Self-pay | Admitting: Sports Medicine

## 2017-07-09 DIAGNOSIS — I10 Essential (primary) hypertension: Secondary | ICD-10-CM

## 2017-07-10 MED ORDER — MELOXICAM 15 MG PO TABS: 15.0000 mg | ORAL_TABLET | Freq: Every day | ORAL | 0 refills | Status: DC

## 2017-07-10 MED ORDER — PRAVASTATIN SODIUM 20 MG PO TABS: 20.0000 mg | ORAL_TABLET | Freq: Every day | ORAL | 0 refills | Status: DC

## 2017-07-10 MED ORDER — SPIRONOLACTONE 25 MG PO TABS: 25.0000 mg | ORAL_TABLET | Freq: Two times a day (BID) | ORAL | 0 refills | Status: DC

## 2017-07-10 MED ORDER — TIZANIDINE HCL 4 MG PO TABS: 4.0000 mg | ORAL_TABLET | Freq: Four times a day (QID) | ORAL | 0 refills | Status: DC | PRN

## 2017-07-23 DIAGNOSIS — R69 Illness, unspecified: Secondary | ICD-10-CM | POA: Diagnosis not present

## 2017-08-27 ENCOUNTER — Ambulatory Visit (INDEPENDENT_AMBULATORY_CARE_PROVIDER_SITE_OTHER): Payer: Medicare HMO | Admitting: Sports Medicine

## 2017-08-27 DIAGNOSIS — M170 Bilateral primary osteoarthritis of knee: Secondary | ICD-10-CM | POA: Diagnosis not present

## 2017-08-27 DIAGNOSIS — M7551 Bursitis of right shoulder: Secondary | ICD-10-CM | POA: Diagnosis not present

## 2017-08-27 DIAGNOSIS — M7552 Bursitis of left shoulder: Secondary | ICD-10-CM

## 2017-08-27 NOTE — Progress Notes (Signed)
Subjective:    CC: Knee pain, shoulder pain  HPI: Knee pain: Right-sided, x-ray confirmed osteoarthritis, previous Euflexxa series was in the right knee in August 2018.  Pain is moderate, persistent, localized to the medial joint line without radiation.  Bilateral shoulder pain: Localized over the deltoid, worse with overhead activities, moderate, persistent, bilateral.  Has had injections over 6 months ago.  I reviewed the past medical history, family history, social history, surgical history, and allergies today and no changes were needed.  Please see the problem list section below in epic for further details.  Past Medical History: Past Medical History:  Diagnosis Date  . Abnormal vaginal Pap smear    monitored annually by Dr. Dory Horn  . Colon polyps    multple right colon polyps  . Degenerative joint disease   . GERD (gastroesophageal reflux disease)   . Hyperlipidemia    NMR: LDL 120 (1267/584), HDL 59, TG 60. LDL goall<130, ideally <100  . Hypertension   . Hypokalemia    on HCTZ/Triam  . PONV (postoperative nausea and vomiting)   . Vitamin D deficiency    2000 IU   Past Surgical History: Past Surgical History:  Procedure Laterality Date  . ABDOMINAL HYSTERECTOMY    . APPENDECTOMY    . BREAST BIOPSY    . BREAST SURGERY     left breast biopsy  . BUNIONECTOMY  03/2009  . CHOLECYSTECTOMY  09/2007   Lap, Dr.Gerkin   . COLONOSCOPY  2008   virtual colonoscopy  . DILATION AND CURETTAGE OF UTERUS    . ENDOMETRIAL BIOPSY  04-28-13   Dr Nori Riis  . ENDOSCOPIC CONCHA BULLOSA RESECTION Left 03/24/2013   Procedure: ENDOSCOPIC CONCHA BULLOSA RESECTION;  Surgeon: Izora Gala, MD;  Location: [place];  Service: ENT;  Laterality: Left;  . ETHMOIDECTOMY Left 03/24/2013   Procedure: ETHMOIDECTOMY;  Surgeon: Izora Gala, MD;  Location: [place];  Service: ENT;  Laterality: Left;  . HAMMER TOE SURGERY    . HYSTEROSCOPY     w/ D and C in  09/2005,02/2007,03/2008 (+ poly removal); IDU insertion 09/2008, Dr.Neal  . NEURECTOMY FOOT  03/2008   left  . NEUROMA SURGERY     left foot ,Dr.Sikora  . PARTIAL COLECTOMY Right 03/30/2016   Procedure: RIGHT COLECTOMY;  Surgeon: Armandina Gemma, MD;  Location: [place];  Service: General;  Laterality: Right;  . TOE SURGERY     for bone spur  . TUBAL LIGATION     Social History: Social History   Socioeconomic History  . Marital status: Married    Spouse name: Not on file  . Number of children: Not on file  . Years of education: Not on file  . Highest education level: Not on file  Occupational History  . Occupation: Marine scientist, [employer]   Social Needs  . Financial resource strain: Not on file  . Food insecurity:    Worry: Not on file    Inability: Not on file  . Transportation needs:    Medical: Not on file    Non-medical: Not on file  Tobacco Use  . Smoking status: Never Smoker  . Smokeless tobacco: Never Used  Substance and Sexual Activity  . Alcohol use: Yes    Comment: occasional  . Drug use: No  . Sexual activity: Not on file  Lifestyle  . Physical activity:    Days per week: Not on file    Minutes per session: Not on file  . Stress:  Not on file  Relationships  . Social connections:    Talks on phone: Not on file    Gets together: Not on file    Attends religious service: Not on file    Active member of club or organization: Not on file    Attends meetings of clubs or organizations: Not on file    Relationship status: Not on file  Other Topics Concern  . Not on file  Social History Narrative   Heart Healthy Diet         Family History: Family History  Problem Relation Age of Onset  . Heart attack Father 73       CABG 4-vessel  . Diabetes Father        late onset  . Lung cancer Mother        former smoker  . Diabetes Mother        late onset  . Breast cancer Mother   . Obesity Sister   . Stroke Paternal Grandfather 43  . Lung cancer Maternal Grandfather          former smoker  . Breast cancer Maternal Aunt   . Heart attack Paternal Grandmother 67  . Diabetes type I Unknown        grand daughter   Allergies: Allergies  Allergen Reactions  . Hydrochlorothiazide W-Triamterene Other (See Comments)    hypokalemia  . Povidone-Iodine Itching    Intolerance, if exposed over a long period.  Ok if used and washed off quickly.   Medications: See med rec.  Review of Systems: No fevers, chills, night sweats, weight loss, chest pain, or shortness of breath.   Objective:    General: Well Developed, well nourished, and in no acute distress.  Neuro: Alert and oriented x3, extra-ocular muscles intact, sensation grossly intact.  HEENT: Normocephalic, atraumatic, pupils equal round reactive to light, neck supple, no masses, no lymphadenopathy, thyroid nonpalpable.  Skin: Warm and dry, no rashes. Cardiac: Regular rate and rhythm, no murmurs rubs or gallops, no lower extremity edema.  Respiratory: Clear to auscultation bilaterally. Not using accessory muscles, speaking in full sentences.  Procedure: Real-time Ultrasound Guided Injection of right subacromial bursa Device: GE Logiq E  Verbal informed consent obtained.  Time-out conducted.  Noted no overlying erythema, induration, or other signs of local infection.  Skin prepped in a sterile fashion.  Local anesthesia: Topical Ethyl chloride.  With sterile technique and under real time ultrasound guidance: 1 cc Kenalog 40, 1 cc lidocaine, 1 cc bupivacaine injected easily Completed without difficulty  Pain immediately resolved suggesting accurate placement of the medication.  Advised to call if fevers/chills, erythema, induration, drainage, or persistent bleeding.  Images permanently stored and available for review in the ultrasound unit.  Impression: Technically successful ultrasound guided injection.  Procedure: Real-time Ultrasound Guided Injection of left subacromial bursa Device: GE Logiq E   Verbal informed consent obtained.  Time-out conducted.  Noted no overlying erythema, induration, or other signs of local infection.  Skin prepped in a sterile fashion.  Local anesthesia: Topical Ethyl chloride.  With sterile technique and under real time ultrasound guidance: 1 cc Kenalog 40, 1 cc lidocaine, 1 cc bupivacaine injected easily Completed without difficulty  Pain immediately resolved suggesting accurate placement of the medication.  Advised to call if fevers/chills, erythema, induration, drainage, or persistent bleeding.  Images permanently stored and available for review in the ultrasound unit.  Impression: Technically successful ultrasound guided injection.  Procedure: Real-time Ultrasound Guided Injection of right knee  Device: GE Logiq E  Verbal informed consent obtained.  Time-out conducted.  Noted no overlying erythema, induration, or other signs of local infection.  Skin prepped in a sterile fashion.  Local anesthesia: Topical Ethyl chloride.  With sterile technique and under real time ultrasound guidance: 1 syringe of Euflexxa injected easily into the right suprapatellar recess. Completed without difficulty  Pain immediately resolved suggesting accurate placement of the medication.  Advised to call if fevers/chills, erythema, induration, drainage, or persistent bleeding.  Images permanently stored and available for review in the ultrasound unit.  Impression: Technically successful ultrasound guided injection.  Impression and Recommendations:    Primary osteoarthritis of both knees Euflexxa injection #1 of 3 into the right knee, return in 1 week for #2. Initially we were going to use these for her left knee but left knee is doing okay, right knee hurting.  We finished a series of Euflexxa back in August 2018 in the right knee.  Bilateral shoulder bursitis Previous injections were 6 months ago, repeat bilateral subacromial injections, return as  needed. __________________________________________ Gwen Her. Dianah Field, M.D., ABFM., CAQSM. Primary Care and [place] Instructor of [place] of [facility] of Medicine

## 2017-08-27 NOTE — Assessment & Plan Note (Signed)
Euflexxa injection #1 of 3 into the right knee, return in 1 week for #2. Initially we were going to use these for her left knee but left knee is doing okay, right knee hurting.  We finished a series of Euflexxa back in August 2018 in the right knee.

## 2017-08-27 NOTE — Assessment & Plan Note (Signed)
Previous injections were 6 months ago, repeat bilateral subacromial injections, return as needed.

## 2017-09-03 ENCOUNTER — Ambulatory Visit (INDEPENDENT_AMBULATORY_CARE_PROVIDER_SITE_OTHER): Payer: Medicare HMO | Admitting: Sports Medicine

## 2017-09-03 DIAGNOSIS — M170 Bilateral primary osteoarthritis of knee: Secondary | ICD-10-CM | POA: Diagnosis not present

## 2017-09-03 NOTE — Assessment & Plan Note (Signed)
Euflexxa injection #2 of 3 into the right knee, return in 1 week for #3. She did well with a series of Euflexxa back in August 2018.

## 2017-09-03 NOTE — Progress Notes (Signed)
   Procedure: Real-time Ultrasound Guided Injection of right knee Device: GE Logiq E  Verbal informed consent obtained.  Time-out conducted.  Noted no overlying erythema, induration, or other signs of local infection.  Skin prepped in a sterile fashion.  Local anesthesia: Topical Ethyl chloride.  With sterile technique and under real time ultrasound guidance: 1 syringe of Euflexxa injected easily into the suprapatellar recess through a 22-gauge needle. Completed without difficulty  Pain immediately resolved suggesting accurate placement of the medication.  Advised to call if fevers/chills, erythema, induration, drainage, or persistent bleeding.  Images permanently stored and available for review in the ultrasound unit.  Impression: Technically successful ultrasound guided injection.

## 2017-09-10 ENCOUNTER — Encounter: Payer: Self-pay | Admitting: Sports Medicine

## 2017-09-10 ENCOUNTER — Ambulatory Visit (INDEPENDENT_AMBULATORY_CARE_PROVIDER_SITE_OTHER): Payer: Medicare HMO | Admitting: Sports Medicine

## 2017-09-10 DIAGNOSIS — M170 Bilateral primary osteoarthritis of knee: Secondary | ICD-10-CM

## 2017-09-10 NOTE — Progress Notes (Signed)
   Procedure: Real-time Ultrasound Guided Injection of right knee Device: GE Logiq E  Verbal informed consent obtained.  Time-out conducted.  Noted no overlying erythema, induration, or other signs of local infection.  Skin prepped in a sterile fashion.  Local anesthesia: Topical Ethyl chloride.  With sterile technique and under real time ultrasound guidance: Euflexxa Completed without difficulty  Pain immediately resolved suggesting accurate placement of the medication.  Advised to call if fevers/chills, erythema, induration, drainage, or persistent bleeding.  Images permanently stored and available for review in the ultrasound unit.  Impression: Technically successful ultrasound guided injection.

## 2017-09-10 NOTE — Assessment & Plan Note (Signed)
Euflexxa injection #3 of 3 into the right knee, return as needed.

## 2017-09-19 DIAGNOSIS — R69 Illness, unspecified: Secondary | ICD-10-CM | POA: Diagnosis not present

## 2017-10-03 DIAGNOSIS — L814 Other melanin hyperpigmentation: Secondary | ICD-10-CM | POA: Diagnosis not present

## 2017-10-03 DIAGNOSIS — L570 Actinic keratosis: Secondary | ICD-10-CM | POA: Diagnosis not present

## 2017-10-03 DIAGNOSIS — D692 Other nonthrombocytopenic purpura: Secondary | ICD-10-CM | POA: Diagnosis not present

## 2017-10-03 DIAGNOSIS — D1801 Hemangioma of skin and subcutaneous tissue: Secondary | ICD-10-CM | POA: Diagnosis not present

## 2017-10-03 DIAGNOSIS — L821 Other seborrheic keratosis: Secondary | ICD-10-CM | POA: Diagnosis not present

## 2017-10-03 DIAGNOSIS — D2272 Melanocytic nevi of left lower limb, including hip: Secondary | ICD-10-CM | POA: Diagnosis not present

## 2017-10-10 ENCOUNTER — Other Ambulatory Visit: Payer: Self-pay

## 2017-10-10 DIAGNOSIS — I10 Essential (primary) hypertension: Secondary | ICD-10-CM

## 2017-10-10 MED ORDER — PRAVASTATIN SODIUM 20 MG PO TABS: 20.0000 mg | ORAL_TABLET | Freq: Every day | ORAL | 0 refills | Status: DC

## 2017-10-10 MED ORDER — MELOXICAM 15 MG PO TABS: 15.0000 mg | ORAL_TABLET | Freq: Every day | ORAL | 0 refills | Status: DC

## 2017-10-10 MED ORDER — SPIRONOLACTONE 25 MG PO TABS: 25.0000 mg | ORAL_TABLET | Freq: Two times a day (BID) | ORAL | 0 refills | Status: DC

## 2018-01-04 ENCOUNTER — Ambulatory Visit (INDEPENDENT_AMBULATORY_CARE_PROVIDER_SITE_OTHER): Payer: Medicare HMO | Admitting: Sports Medicine

## 2018-01-04 ENCOUNTER — Encounter: Payer: Self-pay | Admitting: Sports Medicine

## 2018-01-04 ENCOUNTER — Ambulatory Visit (INDEPENDENT_AMBULATORY_CARE_PROVIDER_SITE_OTHER): Payer: Medicare HMO

## 2018-01-04 VITALS — BP 129/80 | HR 61

## 2018-01-04 DIAGNOSIS — M79672 Pain in left foot: Secondary | ICD-10-CM

## 2018-01-04 DIAGNOSIS — M170 Bilateral primary osteoarthritis of knee: Secondary | ICD-10-CM | POA: Diagnosis not present

## 2018-01-04 DIAGNOSIS — M79675 Pain in left toe(s): Secondary | ICD-10-CM

## 2018-01-04 DIAGNOSIS — I10 Essential (primary) hypertension: Secondary | ICD-10-CM

## 2018-01-04 DIAGNOSIS — Z23 Encounter for immunization: Secondary | ICD-10-CM

## 2018-01-04 DIAGNOSIS — Z0000 Encounter for general adult medical examination without abnormal findings: Secondary | ICD-10-CM | POA: Diagnosis not present

## 2018-01-04 MED ORDER — PRAVASTATIN SODIUM 20 MG PO TABS: 20.0000 mg | ORAL_TABLET | Freq: Every day | ORAL | 1 refills | Status: DC

## 2018-01-04 MED ORDER — SPIRONOLACTONE 25 MG PO TABS: 25.0000 mg | ORAL_TABLET | Freq: Two times a day (BID) | ORAL | 1 refills | Status: DC

## 2018-01-04 MED ORDER — MELOXICAM 15 MG PO TABS: 15.0000 mg | ORAL_TABLET | Freq: Every day | ORAL | 1 refills | Status: DC

## 2018-01-04 NOTE — Assessment & Plan Note (Signed)
Pneumococcal 23, flu shot today.

## 2018-01-04 NOTE — Assessment & Plan Note (Signed)
With medial deviation of the third toe. History of 3/4 Morton's neuroma excision. Suspect lateral capsular rupture after a long hike. X-rays, postop shoe, buddy taped to the third and fourth toes together. Return in a month, podiatry referral and MRI if no better.

## 2018-01-04 NOTE — Progress Notes (Signed)
Subjective:    CC: Left foot pain  HPI: This is a pleasant 67 year old female, she has a history of a left 3/4 Morton's neuroma excision as well as a first and third toe osteotomy.  More recently she went on a long hike.  Afterwards she noted pain, swelling, bruising between the third and fourth toes with medial deviation of her third toe.  Pain over the third MTP as well.  Moderate, persistent, localized without radiation.  I reviewed the past medical history, family history, social history, surgical history, and allergies today and no changes were needed.  Please see the problem list section below in epic for further details.  Past Medical History: Past Medical History:  Diagnosis Date  . Abnormal vaginal Pap smear    monitored annually by Dr. Dory Horn  . Colon polyps    multple right colon polyps  . Degenerative joint disease   . GERD (gastroesophageal reflux disease)   . Hyperlipidemia    NMR: LDL 120 (1267/584), HDL 59, TG 60. LDL goall<130, ideally <100  . Hypertension   . Hypokalemia    on HCTZ/Triam  . PONV (postoperative nausea and vomiting)   . Vitamin D deficiency    2000 IU   Past Surgical History: Past Surgical History:  Procedure Laterality Date  . ABDOMINAL HYSTERECTOMY    . APPENDECTOMY    . BREAST BIOPSY    . BREAST SURGERY     left breast biopsy  . BUNIONECTOMY  03/2009  . CHOLECYSTECTOMY  09/2007   Lap, Dr.Gerkin   . COLONOSCOPY  2008   virtual colonoscopy  . DILATION AND CURETTAGE OF UTERUS    . ENDOMETRIAL BIOPSY  04-28-13   Dr Nori Riis  . ENDOSCOPIC CONCHA BULLOSA RESECTION Left 03/24/2013   Procedure: ENDOSCOPIC CONCHA BULLOSA RESECTION;  Surgeon: Izora Gala, MD;  Location: [place];  Service: ENT;  Laterality: Left;  . ETHMOIDECTOMY Left 03/24/2013   Procedure: ETHMOIDECTOMY;  Surgeon: Izora Gala, MD;  Location: [place];  Service: ENT;  Laterality: Left;  . HAMMER TOE SURGERY    . HYSTEROSCOPY     w/ D and C in  09/2005,02/2007,03/2008 (+ poly removal); IDU insertion 09/2008, Dr.Neal  . NEURECTOMY FOOT  03/2008   left  . NEUROMA SURGERY     left foot ,Dr.Sikora  . PARTIAL COLECTOMY Right 03/30/2016   Procedure: RIGHT COLECTOMY;  Surgeon: Armandina [place], MD;  Location: [place];  Service: General;  Laterality: Right;  . TOE SURGERY     for bone spur  . TUBAL LIGATION     Social History: Social History   Socioeconomic History  . Marital status: Married    Spouse name: Not on file  . Number of children: Not on file  . Years of education: Not on file  . Highest education level: Not on file  Occupational History  . Occupation: Marine scientist, [employer]   Social Needs  . Financial resource strain: Not on file  . Food insecurity:    Worry: Not on file    Inability: Not on file  . Transportation needs:    Medical: Not on file    Non-medical: Not on file  Tobacco Use  . Smoking status: Never Smoker  . Smokeless tobacco: Never Used  Substance and Sexual Activity  . Alcohol use: Yes    Comment: occasional  . Drug use: No  . Sexual activity: Not on file  Lifestyle  . Physical activity:    Days per week:  Not on file    Minutes per session: Not on file  . Stress: Not on file  Relationships  . Social connections:    Talks on phone: Not on file    Gets together: Not on file    Attends religious service: Not on file    Active member of club or organization: Not on file    Attends meetings of clubs or organizations: Not on file    Relationship status: Not on file  Other Topics Concern  . Not on file  Social History Narrative   Heart Healthy Diet         Family History: Family History  Problem Relation Age of Onset  . Heart attack Father 18       CABG 4-vessel  . Diabetes Father        late onset  . Lung cancer Mother        former smoker  . Diabetes Mother        late onset  . Breast cancer Mother   . Obesity Sister   . Stroke Paternal Grandfather 46  . Lung cancer Maternal Grandfather          former smoker  . Breast cancer Maternal Aunt   . Heart attack Paternal Grandmother 58  . Diabetes type I Unknown        grand daughter   Allergies: Allergies  Allergen Reactions  . Hydrochlorothiazide W-Triamterene Other (See Comments)    hypokalemia  . Povidone-Iodine Itching    Intolerance, if exposed over a long period.  Ok if used and washed off quickly.   Medications: See med rec.  Review of Systems: No fevers, chills, night sweats, weight loss, chest pain, or shortness of breath.   Objective:    General: Well Developed, well nourished, and in no acute distress.  Neuro: Alert and oriented x3, extra-ocular muscles intact, sensation grossly intact.  HEENT: Normocephalic, atraumatic, pupils equal round reactive to light, neck supple, no masses, no lymphadenopathy, thyroid nonpalpable.  Skin: Warm and dry, no rashes. Cardiac: Regular rate and rhythm, no murmurs rubs or gallops, no lower extremity edema.  Respiratory: Clear to auscultation bilaterally. Not using accessory muscles, speaking in full sentences. Left foot: Swollen at the 3/4 interspace. Range of motion is full in all directions. Strength is 5/5 in all directions. No hallux valgus. No pes cavus or pes planus. No abnormal callus noted. No pain over the navicular prominence, or base of fifth metatarsal. No tenderness to palpation of the calcaneal insertion of plantar fascia. No pain at the Achilles insertion. No pain over the calcaneal bursa. No pain of the retrocalcaneal bursa. No tenderness to palpation over the tarsals, metatarsals, or phalanges. No hallux rigidus or limitus. Tender to palpation directly over the lateral aspect of the third MTP. No pain with compression of the metatarsal heads. Neurovascularly intact distally.  Third and fourth toes were buddy taped and postop shoe applied.  X-rays reviewed, negative with the exception of first metatarsal osteotomy.  Impression and Recommendations:     Left foot pain With medial deviation of the third toe. History of 3/4 Morton's neuroma excision. Suspect lateral capsular rupture after a long hike. X-rays, postop shoe, buddy taped to the third and fourth toes together. Return in a month, podiatry referral and MRI if no better.  Annual physical exam Pneumococcal 23, flu shot today. ___________________________________________ Gwen Her. Dianah Field, M.D., ABFM., CAQSM. Primary Care and Sports Medicine [facility]  Adjunct Professor of [facility] [employer]  [employer]

## 2018-01-22 ENCOUNTER — Encounter: Payer: Self-pay | Admitting: Sports Medicine

## 2018-01-30 ENCOUNTER — Encounter: Payer: Self-pay | Admitting: Sports Medicine

## 2018-01-30 ENCOUNTER — Ambulatory Visit (INDEPENDENT_AMBULATORY_CARE_PROVIDER_SITE_OTHER): Payer: Medicare HMO | Admitting: Sports Medicine

## 2018-01-30 DIAGNOSIS — M7551 Bursitis of right shoulder: Secondary | ICD-10-CM

## 2018-01-30 DIAGNOSIS — M79672 Pain in left foot: Secondary | ICD-10-CM | POA: Diagnosis not present

## 2018-01-30 DIAGNOSIS — M7552 Bursitis of left shoulder: Secondary | ICD-10-CM

## 2018-01-30 NOTE — Assessment & Plan Note (Signed)
Doing well, history of 3/4 Morton's neuroma excision. She likely had a lateral capsular rupture after the long hike. Postop shoe and buddy taping has resolved pain. Return as needed.

## 2018-01-30 NOTE — Assessment & Plan Note (Signed)
Repeat bilateral subacromial injection, previous injection was 5 months ago.

## 2018-01-30 NOTE — Progress Notes (Signed)
Subjective:    CC: Recheck foot, shoulder pain  HPI: Bilateral shoulder bursitis: Has done well with occasional subacromial injections, the previous injection was 5 months ago, now having a recurrence of pain over the deltoid, moderate, persistent without radiation, keeping her up from sleep.  No trauma, no mechanical symptoms.  Foot pain: Resolved with buddy taping and postop shoe for a month.  I reviewed the past medical history, family history, social history, surgical history, and allergies today and no changes were needed.  Please see the problem list section below in epic for further details.  Past Medical History: Past Medical History:  Diagnosis Date  . Abnormal vaginal Pap smear    monitored annually by Dr. Dory Horn  . Colon polyps    multple right colon polyps  . Degenerative joint disease   . GERD (gastroesophageal reflux disease)   . Hyperlipidemia    NMR: LDL 120 (1267/584), HDL 59, TG 60. LDL goall<130, ideally <100  . Hypertension   . Hypokalemia    on HCTZ/Triam  . PONV (postoperative nausea and vomiting)   . Vitamin D deficiency    2000 IU   Past Surgical History: Past Surgical History:  Procedure Laterality Date  . ABDOMINAL HYSTERECTOMY    . APPENDECTOMY    . BREAST BIOPSY    . BREAST SURGERY     left breast biopsy  . BUNIONECTOMY  03/2009  . CHOLECYSTECTOMY  09/2007   Lap, Dr.Gerkin   . COLONOSCOPY  2008   virtual colonoscopy  . DILATION AND CURETTAGE OF UTERUS    . ENDOMETRIAL BIOPSY  04-28-13   Dr Nori Riis  . ENDOSCOPIC CONCHA BULLOSA RESECTION Left 03/24/2013   Procedure: ENDOSCOPIC CONCHA BULLOSA RESECTION;  Surgeon: Izora Gala, MD;  Location: [place];  Service: ENT;  Laterality: Left;  . ETHMOIDECTOMY Left 03/24/2013   Procedure: ETHMOIDECTOMY;  Surgeon: Izora Gala, MD;  Location: [place];  Service: ENT;  Laterality: Left;  . HAMMER TOE SURGERY    . HYSTEROSCOPY     w/ D and C in 09/2005,02/2007,03/2008 (+ poly  removal); IDU insertion 09/2008, Dr.Neal  . NEURECTOMY FOOT  03/2008   left  . NEUROMA SURGERY     left foot ,Dr.Sikora  . PARTIAL COLECTOMY Right 03/30/2016   Procedure: RIGHT COLECTOMY;  Surgeon: Armandina [place], MD;  Location: [place];  Service: General;  Laterality: Right;  . TOE SURGERY     for bone spur  . TUBAL LIGATION     Social History: Social History   Socioeconomic History  . Marital status: Married    Spouse name: Not on file  . Number of children: Not on file  . Years of education: Not on file  . Highest education level: Not on file  Occupational History  . Occupation: Marine scientist, [employer]   Social Needs  . Financial resource strain: Not on file  . Food insecurity:    Worry: Not on file    Inability: Not on file  . Transportation needs:    Medical: Not on file    Non-medical: Not on file  Tobacco Use  . Smoking status: Never Smoker  . Smokeless tobacco: Never Used  Substance and Sexual Activity  . Alcohol use: Yes    Comment: occasional  . Drug use: No  . Sexual activity: Not on file  Lifestyle  . Physical activity:    Days per week: Not on file    Minutes per session: Not on file  .  Stress: Not on file  Relationships  . Social connections:    Talks on phone: Not on file    Gets together: Not on file    Attends religious service: Not on file    Active member of club or organization: Not on file    Attends meetings of clubs or organizations: Not on file    Relationship status: Not on file  Other Topics Concern  . Not on file  Social History Narrative   Heart Healthy Diet         Family History: Family History  Problem Relation Age of Onset  . Heart attack Father 82       CABG 4-vessel  . Diabetes Father        late onset  . Lung cancer Mother        former smoker  . Diabetes Mother        late onset  . Breast cancer Mother   . Obesity Sister   . Stroke Paternal Grandfather 78  . Lung cancer Maternal Grandfather        former smoker  . Breast  cancer Maternal Aunt   . Heart attack Paternal Grandmother 30  . Diabetes type I Unknown        grand daughter   Allergies: Allergies  Allergen Reactions  . Hydrochlorothiazide W-Triamterene Other (See Comments)    hypokalemia  . Povidone-Iodine Itching    Intolerance, if exposed over a long period.  Ok if used and washed off quickly.   Medications: See med rec.  Review of Systems: No fevers, chills, night sweats, weight loss, chest pain, or shortness of breath.   Objective:    General: Well Developed, well nourished, and in no acute distress.  Neuro: Alert and oriented x3, extra-ocular muscles intact, sensation grossly intact.  HEENT: Normocephalic, atraumatic, pupils equal round reactive to light, neck supple, no masses, no lymphadenopathy, thyroid nonpalpable.  Skin: Warm and dry, no rashes. Cardiac: Regular rate and rhythm, no murmurs rubs or gallops, no lower extremity edema.  Respiratory: Clear to auscultation bilaterally. Not using accessory muscles, speaking in full sentences. Left foot: No visible erythema or swelling. Range of motion is full in all directions. Strength is 5/5 in all directions. No hallux valgus. No pes cavus or pes planus. No abnormal callus noted. No pain over the navicular prominence, or base of fifth metatarsal. No tenderness to palpation of the calcaneal insertion of plantar fascia. No pain at the Achilles insertion. No pain over the calcaneal bursa. No pain of the retrocalcaneal bursa. No tenderness to palpation over the tarsals, metatarsals, or phalanges. No hallux rigidus or limitus. No tenderness palpation over interphalangeal joints. No pain with compression of the metatarsal heads. No tenderness at the 3/4 intermetatarsal bursa Neurovascularly intact distally.  Procedure: Real-time Ultrasound Guided Injection of right subacromial bursa Device: GE Logiq E  Verbal informed consent obtained.  Time-out conducted.  Noted no overlying  erythema, induration, or other signs of local infection.  Skin prepped in a sterile fashion.  Local anesthesia: Topical Ethyl chloride.  With sterile technique and under real time ultrasound guidance: 1 cc Kenalog 40, 1 cc lidocaine, 1 cc bupivacaine injected easily Completed without difficulty  Pain immediately resolved suggesting accurate placement of the medication.  Advised to call if fevers/chills, erythema, induration, drainage, or persistent bleeding.  Images permanently stored and available for review in the ultrasound unit.  Impression: Technically successful ultrasound guided injection.  Procedure: Real-time Ultrasound Guided Injection of left subacromial  bursa Device: GE Logiq E  Verbal informed consent obtained.  Time-out conducted.  Noted no overlying erythema, induration, or other signs of local infection.  Skin prepped in a sterile fashion.  Local anesthesia: Topical Ethyl chloride.  With sterile technique and under real time ultrasound guidance: 1 cc Kenalog 40, 1 cc lidocaine, 1 cc bupivacaine injected easily Completed without difficulty  Pain immediately resolved suggesting accurate placement of the medication.  Advised to call if fevers/chills, erythema, induration, drainage, or persistent bleeding.  Images permanently stored and available for review in the ultrasound unit.  Impression: Technically successful ultrasound guided injection.  Impression and Recommendations:    Bilateral shoulder bursitis Repeat bilateral subacromial injection, previous injection was 5 months ago.  Left foot pain Doing well, history of 3/4 Morton's neuroma excision. She likely had a lateral capsular rupture after the long hike. Postop shoe and buddy taping has resolved pain. Return as needed. ___________________________________________ Gwen Her. Dianah Field, M.D., ABFM., CAQSM. Primary Care and Sports Medicine [facility]  Adjunct Professor of [employer] of Medicine

## 2018-02-01 ENCOUNTER — Ambulatory Visit: Payer: Medicare HMO | Admitting: Sports Medicine

## 2018-02-09 ENCOUNTER — Other Ambulatory Visit: Payer: Self-pay | Admitting: Sports Medicine

## 2018-03-05 IMAGING — XA DG INJECT/[PERSON_NAME] INC NEEDLE/CATH/PLC EPI/CERV/THOR W/IMG
1 series · 1 of 1 positions shown · non-contrast
Comparison: none

CLINICAL DATA: Cervical spondylosis without myelopathy. RIGHT arm
radicular symptoms predominate. MRI demonstrates mild multilevel
disease without disc extrusion or spinal stenosis.

[Series 2: ortho standard · 1 of 1 slices shown]
[im 1/1]
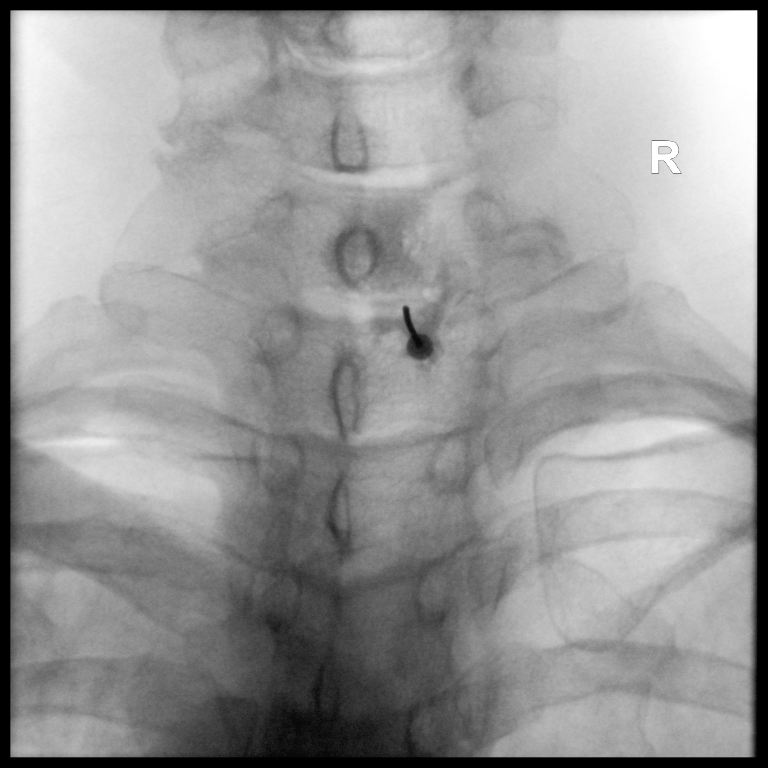

[1 of 1 positions shown; findings below may reference images not displayed]

FLUOROSCOPY TIME:  21 seconds corresponding to a Dose Area Product
of 49.21 ?Gy*m2

PROCEDURE:
Informed written consent was obtained.  Time-out was performed.

An appropriate skin entry site was chosen, cleansed with Betadine,
and anesthetized with 1% lidocaine.

CERVICAL EPIDURAL INJECTION

An interlaminar approach was performed on the RIGHT at C7-T1 . A 20
gauge epidural needle was advanced using loss-of-resistance
technique.

DIAGNOSTIC EPIDURAL INJECTION

Injection of Isovue-M 300 shows a good epidural pattern with spread
above and below the level of needle placement, primarily on the
RIGHT. No vascular opacification is seen. THERAPEUTIC

EPIDURAL INJECTION

1.5 ml of Kenalog 40 mixed with 1 ml of 1% Lidocaine and 2 ml of
normal saline were then instilled. The procedure was well-tolerated,
and the patient was discharged thirty minutes following the
injection in good condition.
IMPRESSION: Technically successful first epidural injection on the RIGHT at
C7-T1.

## 2018-03-17 ENCOUNTER — Other Ambulatory Visit: Payer: Self-pay | Admitting: Sports Medicine

## 2018-04-03 DIAGNOSIS — R69 Illness, unspecified: Secondary | ICD-10-CM | POA: Diagnosis not present

## 2018-04-12 ENCOUNTER — Encounter: Payer: Self-pay | Admitting: Sports Medicine

## 2018-04-18 ENCOUNTER — Ambulatory Visit (INDEPENDENT_AMBULATORY_CARE_PROVIDER_SITE_OTHER): Payer: Medicare HMO | Admitting: Sports Medicine

## 2018-04-18 ENCOUNTER — Encounter: Payer: Self-pay | Admitting: Sports Medicine

## 2018-04-18 DIAGNOSIS — M79672 Pain in left foot: Secondary | ICD-10-CM | POA: Diagnosis not present

## 2018-04-18 DIAGNOSIS — L659 Nonscarring hair loss, unspecified: Secondary | ICD-10-CM | POA: Diagnosis not present

## 2018-04-18 DIAGNOSIS — B354 Tinea corporis: Secondary | ICD-10-CM | POA: Insufficient documentation

## 2018-04-18 DIAGNOSIS — L853 Xerosis cutis: Secondary | ICD-10-CM | POA: Diagnosis not present

## 2018-04-18 MED ORDER — TRIAMCINOLONE 0.1 % CREAM:EUCERIN CREAM 1:1: 1.0000 "application " | TOPICAL_CREAM | Freq: Two times a day (BID) | CUTANEOUS | 11 refills | Status: DC

## 2018-04-18 MED ORDER — CLOTRIMAZOLE-BETAMETHASONE 1-0.05 % EX CREA: 1.0000 "application " | TOPICAL_CREAM | Freq: Two times a day (BID) | CUTANEOUS | 0 refills | Status: DC

## 2018-04-18 NOTE — Progress Notes (Signed)
Subjective:    CC: Multiple problems  HPI: Foot pain: History of 3/4 Morton's neuroma excision, persistent pain at the third MTP, dorsal and plantar, worse with prolonged weightbearing.  Skin rash: Localized on the right upper chest, circular, not really pruritic.  Back itching: Present for a couple of months now.  She is to the point where she is picking at it and creating sores.  Also noticing some hair loss.  I reviewed the past medical history, family history, social history, surgical history, and allergies today and no changes were needed.  Please see the problem list section below in epic for further details.  Past Medical History: Past Medical History:  Diagnosis Date  . Abnormal vaginal Pap smear    monitored annually by Dr. Dory Horn  . Colon polyps    multple right colon polyps  . Degenerative joint disease   . GERD (gastroesophageal reflux disease)   . Hyperlipidemia    NMR: LDL 120 (1267/584), HDL 59, TG 60. LDL goall<130, ideally <100  . Hypertension   . Hypokalemia    on HCTZ/Triam  . PONV (postoperative nausea and vomiting)   . Vitamin D deficiency    2000 IU   Past Surgical History: Past Surgical History:  Procedure Laterality Date  . ABDOMINAL HYSTERECTOMY    . APPENDECTOMY    . BREAST BIOPSY    . BREAST SURGERY     left breast biopsy  . BUNIONECTOMY  03/2009  . CHOLECYSTECTOMY  09/2007   Lap, Dr.Gerkin   . COLONOSCOPY  2008   virtual colonoscopy  . DILATION AND CURETTAGE OF UTERUS    . ENDOMETRIAL BIOPSY  04-28-13   Dr Nori Riis  . ENDOSCOPIC CONCHA BULLOSA RESECTION Left 03/24/2013   Procedure: ENDOSCOPIC CONCHA BULLOSA RESECTION;  Surgeon: Izora Gala, MD;  Location: [place];  Service: ENT;  Laterality: Left;  . ETHMOIDECTOMY Left 03/24/2013   Procedure: ETHMOIDECTOMY;  Surgeon: Izora Gala, MD;  Location: [place];  Service: ENT;  Laterality: Left;  . HAMMER TOE SURGERY    . HYSTEROSCOPY     w/ D and C in  09/2005,02/2007,03/2008 (+ poly removal); IDU insertion 09/2008, Dr.Neal  . NEURECTOMY FOOT  03/2008   left  . NEUROMA SURGERY     left foot ,Dr.Sikora  . PARTIAL COLECTOMY Right 03/30/2016   Procedure: RIGHT COLECTOMY;  Surgeon: Armandina [place], MD;  Location: [place];  Service: General;  Laterality: Right;  . TOE SURGERY     for bone spur  . TUBAL LIGATION     Social History: Social History   Socioeconomic History  . Marital status: Married    Spouse name: Not on file  . Number of children: Not on file  . Years of education: Not on file  . Highest education level: Not on file  Occupational History  . Occupation: Marine scientist, [employer]   Social Needs  . Financial resource strain: Not on file  . Food insecurity:    Worry: Not on file    Inability: Not on file  . Transportation needs:    Medical: Not on file    Non-medical: Not on file  Tobacco Use  . Smoking status: Never Smoker  . Smokeless tobacco: Never Used  Substance and Sexual Activity  . Alcohol use: Yes    Comment: occasional  . Drug use: No  . Sexual activity: Not on file  Lifestyle  . Physical activity:    Days per week: Not on file  Minutes per session: Not on file  . Stress: Not on file  Relationships  . Social connections:    Talks on phone: Not on file    Gets together: Not on file    Attends religious service: Not on file    Active member of club or organization: Not on file    Attends meetings of clubs or organizations: Not on file    Relationship status: Not on file  Other Topics Concern  . Not on file  Social History Narrative   Heart Healthy Diet         Family History: Family History  Problem Relation Age of Onset  . Heart attack Father 9       CABG 4-vessel  . Diabetes Father        late onset  . Lung cancer Mother        former smoker  . Diabetes Mother        late onset  . Breast cancer Mother   . Obesity Sister   . Stroke Paternal Grandfather 17  . Lung cancer Maternal Grandfather          former smoker  . Breast cancer Maternal Aunt   . Heart attack Paternal Grandmother 50  . Diabetes type I Unknown        grand daughter   Allergies: Allergies  Allergen Reactions  . Hydrochlorothiazide W-Triamterene Other (See Comments)    hypokalemia  . Povidone-Iodine Itching    Intolerance, if exposed over a long period.  Ok if used and washed off quickly.   Medications: See med rec.  Review of Systems: No fevers, chills, night sweats, weight loss, chest pain, or shortness of breath.   Objective:    General: Well Developed, well nourished, and in no acute distress.  Neuro: Alert and oriented x3, extra-ocular muscles intact, sensation grossly intact.  HEENT: Normocephalic, atraumatic, pupils equal round reactive to light, neck supple, no masses, no lymphadenopathy, thyroid nonpalpable.  Skin: Warm and dry, neurodermatitis on the back.  Circular lesion on the chest, approximately 1 to 2 cm with a scaling leading edge. Cardiac: Regular rate and rhythm, no murmurs rubs or gallops, no lower extremity edema.  Respiratory: Clear to auscultation bilaterally. Not using accessory muscles, speaking in full sentences. Left foot: No visible erythema or swelling. Range of motion is full in all directions. Strength is 5/5 in all directions. No hallux valgus. No pes cavus or pes planus. No abnormal callus noted. No pain over the navicular prominence, or base of fifth metatarsal. No tenderness to palpation of the calcaneal insertion of plantar fascia. No pain at the Achilles insertion. No pain over the calcaneal bursa. No pain of the retrocalcaneal bursa. No tenderness to palpation over the tarsals, metatarsals, or phalanges. No hallux rigidus or limitus. Tenderness at the third MTP No pain with compression of the metatarsal heads. Neurovascularly intact distally.  Procedure: Real-time Ultrasound Guided Injection of left third MTP Device: GE Logiq E  Verbal informed consent  obtained.  Time-out conducted.  Noted no overlying erythema, induration, or other signs of local infection.  Skin prepped in a sterile fashion.  Local anesthesia: Topical Ethyl chloride.  With sterile technique and under real time ultrasound guidance: Noted mild synovitis, 1/2 cc Kenalog 40, 1/2 cc lidocaine injected easily Completed without difficulty  Pain immediately resolved suggesting accurate placement of the medication.  Advised to call if fevers/chills, erythema, induration, drainage, or persistent bleeding.  Images permanently stored and available for review in  the ultrasound unit.  Impression: Technically successful ultrasound guided injection.  Impression and Recommendations:    Xerosis of skin Adding triamcinolone/Eucerin cream. There is also an element of neurodermatitis from picking. Insufficient relief after a month we will biopsy.  Tinea corporis Circular lesion with a scaly border. Nummular dermatitis versus ringworm. Topical Lotrisone.  Left foot pain History of a 3/4 Morton's neuroma excision. Pain after a long hike resolved with buddy taping and a postop shoe. Recurrence of pain, we are going to inject her third MTP. Return to see me in a month for this.   Hair loss Scalp is normal. Hair pull test is for the most part unremarkable. Checking some labs including TSH. This is likely simple telogen effluvium. ___________________________________________ Gwen Her. Dianah Field, M.D., ABFM., CAQSM. Primary Care and Sports Medicine [facility]  Adjunct Professor of [employer] [facility] [employer] [facility] [employer] Medicine

## 2018-04-18 NOTE — Assessment & Plan Note (Signed)
History of a 3/4 Morton's neuroma excision. Pain after a long hike resolved with buddy taping and a postop shoe. Recurrence of pain, we are going to inject her third MTP. Return to see me in a month for this.

## 2018-04-18 NOTE — Assessment & Plan Note (Signed)
Circular lesion with a scaly border. Nummular dermatitis versus ringworm. Topical Lotrisone.

## 2018-04-18 NOTE — Assessment & Plan Note (Signed)
Adding triamcinolone/Eucerin cream. There is also an element of neurodermatitis from picking. Insufficient relief after a month we will biopsy.

## 2018-04-18 NOTE — Assessment & Plan Note (Signed)
Scalp is normal. Hair pull test is for the most part unremarkable. Checking some labs including TSH. This is likely simple telogen effluvium.

## 2018-04-19 LAB — CBC
HCT: 45.2 % — ABNORMAL HIGH (ref 35.0–45.0)
Hemoglobin: 15.7 g/dL — ABNORMAL HIGH (ref 11.7–15.5)
MCH: 33.8 pg — ABNORMAL HIGH (ref 27.0–33.0)
MCHC: 34.7 g/dL (ref 32.0–36.0)
MCV: 97.4 fL (ref 80.0–100.0)
MPV: 10.3 fL (ref 7.5–12.5)
Platelets: 138 10*3/uL — ABNORMAL LOW (ref 140–400)
RBC: 4.64 10*6/uL (ref 3.80–5.10)
RDW: 11.9 % (ref 11.0–15.0)
WBC: 6.3 10*3/uL (ref 3.8–10.8)

## 2018-04-19 LAB — COMPREHENSIVE METABOLIC PANEL
AG Ratio: 2 (calc) (ref 1.0–2.5)
ALT: 16 U/L (ref 6–29)
AST: 19 U/L (ref 10–35)
Alkaline phosphatase (APISO): 63 U/L (ref 33–130)
BUN: 10 mg/dL (ref 7–25)
CO2: 29 mmol/L (ref 20–32)
Chloride: 105 mmol/L (ref 98–110)
Creat: 0.88 mg/dL (ref 0.50–0.99)
Globulin: 2.2 g/dL (calc) (ref 1.9–3.7)
Glucose, Bld: 79 mg/dL (ref 65–139)
Potassium: 4.6 mmol/L (ref 3.5–5.3)
Sodium: 141 mmol/L (ref 135–146)
Total Bilirubin: 0.7 mg/dL (ref 0.2–1.2)
Total Protein: 6.5 g/dL (ref 6.1–8.1)

## 2018-04-19 LAB — TSH: TSH: 1.18 mIU/L (ref 0.40–4.50)

## 2018-04-19 LAB — COMPREHENSIVE METABOLIC PANEL WITH GFR
Albumin: 4.3 g/dL (ref 3.6–5.1)
Calcium: 9.8 mg/dL (ref 8.6–10.4)

## 2018-05-07 DIAGNOSIS — Z01419 Encounter for gynecological examination (general) (routine) without abnormal findings: Secondary | ICD-10-CM | POA: Diagnosis not present

## 2018-05-07 DIAGNOSIS — Z6828 Body mass index (BMI) 28.0-28.9, adult: Secondary | ICD-10-CM | POA: Diagnosis not present

## 2018-05-16 ENCOUNTER — Ambulatory Visit (INDEPENDENT_AMBULATORY_CARE_PROVIDER_SITE_OTHER): Payer: Medicare HMO | Admitting: Sports Medicine

## 2018-05-16 ENCOUNTER — Encounter: Payer: Self-pay | Admitting: Sports Medicine

## 2018-05-16 DIAGNOSIS — Z0000 Encounter for general adult medical examination without abnormal findings: Secondary | ICD-10-CM | POA: Diagnosis not present

## 2018-05-16 DIAGNOSIS — M79672 Pain in left foot: Secondary | ICD-10-CM | POA: Diagnosis not present

## 2018-05-16 DIAGNOSIS — B354 Tinea corporis: Secondary | ICD-10-CM

## 2018-05-16 DIAGNOSIS — L853 Xerosis cutis: Secondary | ICD-10-CM

## 2018-05-16 NOTE — Assessment & Plan Note (Signed)
Resolved with triamcinolone/Eucerin.

## 2018-05-16 NOTE — Progress Notes (Signed)
Subjective:    CC: Follow-up  HPI: Tinea corporis: Resolved with Lotrisone.  Skin xerosis: Resolved with triamcinolone/Eucerin combo.  Foot pain: Resolved after injection.  I reviewed the past medical history, family history, social history, surgical history, and allergies today and no changes were needed.  Please see the problem list section below in epic for further details.  Past Medical History: Past Medical History:  Diagnosis Date  . Abnormal vaginal Pap smear    monitored annually by Dr. Dory Horn  . Colon polyps    multple right colon polyps  . Degenerative joint disease   . GERD (gastroesophageal reflux disease)   . Hyperlipidemia    NMR: LDL 120 (1267/584), HDL 59, TG 60. LDL goall<130, ideally <100  . Hypertension   . Hypokalemia    on HCTZ/Triam  . PONV (postoperative nausea and vomiting)   . Vitamin D deficiency    2000 IU   Past Surgical History: Past Surgical History:  Procedure Laterality Date  . ABDOMINAL HYSTERECTOMY    . APPENDECTOMY    . BREAST BIOPSY    . BREAST SURGERY     left breast biopsy  . BUNIONECTOMY  03/2009  . CHOLECYSTECTOMY  09/2007   Lap, Dr.Gerkin   . COLONOSCOPY  2008   virtual colonoscopy  . DILATION AND CURETTAGE OF UTERUS    . ENDOMETRIAL BIOPSY  04-28-13   Dr Nori Riis  . ENDOSCOPIC CONCHA BULLOSA RESECTION Left 03/24/2013   Procedure: ENDOSCOPIC CONCHA BULLOSA RESECTION;  Surgeon: Izora Gala, MD;  Location: [place];  Service: ENT;  Laterality: Left;  . ETHMOIDECTOMY Left 03/24/2013   Procedure: ETHMOIDECTOMY;  Surgeon: Izora Gala, MD;  Location: [place];  Service: ENT;  Laterality: Left;  . HAMMER TOE SURGERY    . HYSTEROSCOPY     w/ D and C in 09/2005,02/2007,03/2008 (+ poly removal); IDU insertion 09/2008, Dr.Neal  . NEURECTOMY FOOT  03/2008   left  . NEUROMA SURGERY     left foot ,Dr.Sikora  . PARTIAL COLECTOMY Right 03/30/2016   Procedure: RIGHT COLECTOMY;  Surgeon: Armandina Gemma, MD;  Location:  [place];  Service: General;  Laterality: Right;  . TOE SURGERY     for bone spur  . TUBAL LIGATION     Social History: Social History   Socioeconomic History  . Marital status: Married    Spouse name: Not on file  . Number of children: Not on file  . Years of education: Not on file  . Highest education level: Not on file  Occupational History  . Occupation: Marine scientist, [employer]   Social Needs  . Financial resource strain: Not on file  . Food insecurity:    Worry: Not on file    Inability: Not on file  . Transportation needs:    Medical: Not on file    Non-medical: Not on file  Tobacco Use  . Smoking status: Never Smoker  . Smokeless tobacco: Never Used  Substance and Sexual Activity  . Alcohol use: Yes    Comment: occasional  . Drug use: No  . Sexual activity: Not on file  Lifestyle  . Physical activity:    Days per week: Not on file    Minutes per session: Not on file  . Stress: Not on file  Relationships  . Social connections:    Talks on phone: Not on file    Gets together: Not on file    Attends religious service: Not on file  Active member of club or organization: Not on file    Attends meetings of clubs or organizations: Not on file    Relationship status: Not on file  Other Topics Concern  . Not on file  Social History Narrative   Heart Healthy Diet         Family History: Family History  Problem Relation Age of Onset  . Heart attack Father 22       CABG 4-vessel  . Diabetes Father        late onset  . Lung cancer Mother        former smoker  . Diabetes Mother        late onset  . Breast cancer Mother   . Obesity Sister   . Stroke Paternal Grandfather 76  . Lung cancer Maternal Grandfather        former smoker  . Breast cancer Maternal Aunt   . Heart attack Paternal Grandmother 91  . Diabetes type I Unknown        grand daughter   Allergies: Allergies  Allergen Reactions  . Hydrochlorothiazide W-Triamterene Other (See Comments)     hypokalemia  . Povidone-Iodine Itching    Intolerance, if exposed over a long period.  Ok if used and washed off quickly.   Medications: See med rec.  Review of Systems: No fevers, chills, night sweats, weight loss, chest pain, or shortness of breath.   Objective:    General: Well Developed, well nourished, and in no acute distress.  Neuro: Alert and oriented x3, extra-ocular muscles intact, sensation grossly intact.  HEENT: Normocephalic, atraumatic, pupils equal round reactive to light, neck supple, no masses, no lymphadenopathy, thyroid nonpalpable.  Skin: Warm and dry, no rashes. Cardiac: Regular rate and rhythm, no murmurs rubs or gallops, no lower extremity edema.  Respiratory: Clear to auscultation bilaterally. Not using accessory muscles, speaking in full sentences.   Impression and Recommendations:    No problem-specific Assessment & Plan notes found for this encounter.   ___________________________________________ Gwen Her. Dianah Field, M.D., ABFM., CAQSM. Primary Care and Sports Medicine [facility]  Adjunct Professor of [place] [employer] [facility] [employer] Medicine

## 2018-05-16 NOTE — Assessment & Plan Note (Signed)
Return in April for annual physical

## 2018-05-16 NOTE — Assessment & Plan Note (Signed)
Resolved with Lotrisone 

## 2018-05-16 NOTE — Assessment & Plan Note (Signed)
History of a 3/4 Morton's neuroma excision. He did have third MTP synovitis. Resolved with 3rd MTP injection.

## 2018-07-02 ENCOUNTER — Ambulatory Visit (INDEPENDENT_AMBULATORY_CARE_PROVIDER_SITE_OTHER): Payer: Medicare HMO | Admitting: Sports Medicine

## 2018-07-02 ENCOUNTER — Other Ambulatory Visit: Payer: Self-pay

## 2018-07-02 ENCOUNTER — Encounter: Payer: Self-pay | Admitting: Sports Medicine

## 2018-07-02 DIAGNOSIS — M7552 Bursitis of left shoulder: Secondary | ICD-10-CM | POA: Diagnosis not present

## 2018-07-02 DIAGNOSIS — M7551 Bursitis of right shoulder: Secondary | ICD-10-CM

## 2018-07-02 MED ORDER — HYDROCODONE-ACETAMINOPHEN 5-325 MG PO TABS: 1.0000 | ORAL_TABLET | Freq: Three times a day (TID) | ORAL | 0 refills | Status: DC | PRN

## 2018-07-02 NOTE — Telephone Encounter (Signed)
Patient scheduled.

## 2018-07-02 NOTE — Assessment & Plan Note (Signed)
Historical known bilateral subacromial bursitis, usually does well with injections. Unfortunately had a fall about a month ago, injured right shoulder and having great difficulty abducting. This needs to be good, imaged, and if no rotator cuff tears then possibly injected. I will see her tomorrow in the office. Adding some hydrocodone to use in the meantime.

## 2018-07-02 NOTE — Progress Notes (Signed)
Virtual Visit via Telephone   I connected with  [name]  on 07/02/18 by telephone and verified that I am speaking with the correct person using two identifiers.   I discussed the limitations, risks, security and privacy concerns of performing an evaluation and management service by telephone, including the higher likelihood of inaccurate diagnosis and treatment, and the availability of in person appointments.  We also discussed the likely need of an additional face to face encounter for complete and high quality delivery of care.  I also discussed with the patient that there may be a patient responsible charge related to this service. The patient expressed understanding and wishes to proceed.  Subjective:    CC: Right shoulder pain  HPI: [name] recently had a fall, her last injection was last fall.  She was doing well until the fall last month, right shoulder now with severe pain over the deltoid, worse with abduction, no mechanical symptoms.  Left shoulder is doing fine.  Pain is severe, persistent, localized without radiation. Per her description she is unable to abduct her shoulder very far past parallel with the floor.  I reviewed the past medical history, family history, social history, surgical history, and allergies today and no changes were needed.  Please see the problem list section below in epic for further details.  Past Medical History: Past Medical History:  Diagnosis Date  . Abnormal vaginal Pap smear    monitored annually by Dr. Dory Horn  . Colon polyps    multple right colon polyps  . Degenerative joint disease   . GERD (gastroesophageal reflux disease)   . Hyperlipidemia    NMR: LDL 120 (1267/584), HDL 59, TG 60. LDL goall<130, ideally <100  . Hypertension   . Hypokalemia    on HCTZ/Triam  . PONV (postoperative nausea and vomiting)   . Vitamin D deficiency    2000 IU   Past Surgical History: Past Surgical History:  Procedure Laterality Date  .  ABDOMINAL HYSTERECTOMY    . APPENDECTOMY    . BREAST BIOPSY    . BREAST SURGERY     left breast biopsy  . BUNIONECTOMY  03/2009  . CHOLECYSTECTOMY  09/2007   Lap, Dr.Gerkin   . COLONOSCOPY  2008   virtual colonoscopy  . DILATION AND CURETTAGE OF UTERUS    . ENDOMETRIAL BIOPSY  04-28-13   Dr Nori Riis  . ENDOSCOPIC CONCHA BULLOSA RESECTION Left 03/24/2013   Procedure: ENDOSCOPIC CONCHA BULLOSA RESECTION;  Surgeon: Izora Gala, MD;  Location: [place];  Service: ENT;  Laterality: Left;  . ETHMOIDECTOMY Left 03/24/2013   Procedure: ETHMOIDECTOMY;  Surgeon: Izora Gala, MD;  Location: [place];  Service: ENT;  Laterality: Left;  . HAMMER TOE SURGERY    . HYSTEROSCOPY     w/ D and C in 09/2005,02/2007,03/2008 (+ poly removal); IDU insertion 09/2008, Dr.Neal  . NEURECTOMY FOOT  03/2008   left  . NEUROMA SURGERY     left foot ,Dr.Sikora  . PARTIAL COLECTOMY Right 03/30/2016   Procedure: RIGHT COLECTOMY;  Surgeon: Armandina Gemma, MD;  Location: [place];  Service: General;  Laterality: Right;  . TOE SURGERY     for bone spur  . TUBAL LIGATION     Social History: Social History   Socioeconomic History  . Marital status: Married    Spouse name: Not on file  . Number of children: Not on file  . Years of education: Not on file  . Highest  education level: Not on file  Occupational History  . Occupation: Marine scientist, [employer]   Social Needs  . Financial resource strain: Not on file  . Food insecurity:    Worry: Not on file    Inability: Not on file  . Transportation needs:    Medical: Not on file    Non-medical: Not on file  Tobacco Use  . Smoking status: Never Smoker  . Smokeless tobacco: Never Used  Substance and Sexual Activity  . Alcohol use: Yes    Comment: occasional  . Drug use: No  . Sexual activity: Not on file  Lifestyle  . Physical activity:    Days per week: Not on file    Minutes per session: Not on file  . Stress: Not on file  Relationships  .  Social connections:    Talks on phone: Not on file    Gets together: Not on file    Attends religious service: Not on file    Active member of club or organization: Not on file    Attends meetings of clubs or organizations: Not on file    Relationship status: Not on file  Other Topics Concern  . Not on file  Social History Narrative   Heart Healthy Diet         Family History: Family History  Problem Relation Age of Onset  . Heart attack Father 80       CABG 4-vessel  . Diabetes Father        late onset  . Lung cancer Mother        former smoker  . Diabetes Mother        late onset  . Breast cancer Mother   . Obesity Sister   . Stroke Paternal Grandfather 81  . Lung cancer Maternal Grandfather        former smoker  . Breast cancer Maternal Aunt   . Heart attack Paternal Grandmother 30  . Diabetes type I Unknown        grand daughter   Allergies: Allergies  Allergen Reactions  . Hydrochlorothiazide W-Triamterene Other (See Comments)    hypokalemia  . Povidone-Iodine Itching    Intolerance, if exposed over a long period.  Ok if used and washed off quickly.   Medications: See med rec.  Review of Systems: No fevers, chills, night sweats, weight loss, chest pain, or shortness of breath.   Objective:    General: Speaking full sentences, no audible heavy breathing.  Sounds alert and appropriately interactive.  No other physical exam performed due to the non-face to face nature of this visit.  Impression and Recommendations:    Bilateral shoulder bursitis Historical known bilateral subacromial bursitis, usually does well with injections. Unfortunately had a fall about a month ago, injured right shoulder and having great difficulty abducting. This needs to be good, imaged, and if no rotator cuff tears then possibly injected. I will see her tomorrow in the office. Adding some hydrocodone to use in the meantime.   I discussed the above assessment and treatment plan  with the patient. The patient was provided an opportunity to ask questions and all were answered. The patient agreed with the plan and demonstrated an understanding of the instructions.   The patient was advised to call back or seek an in-person evaluation if the symptoms worsen or if the condition fails to improve as anticipated.   I provided 21 minutes of non-face-to-face time during this encounter, less than 50% of this  was time needed to gather information, review chart, records, and complete documentation.   ___________________________________________ Gwen Her. Dianah Field, M.D., ABFM., CAQSM. Primary Care and Sports Medicine [place] [facility]  Adjunct Professor of [place] [employer] [facility] of Medicine

## 2018-07-03 ENCOUNTER — Other Ambulatory Visit: Payer: Self-pay | Admitting: Sports Medicine

## 2018-07-03 ENCOUNTER — Encounter: Payer: Self-pay | Admitting: Sports Medicine

## 2018-07-03 ENCOUNTER — Ambulatory Visit (INDEPENDENT_AMBULATORY_CARE_PROVIDER_SITE_OTHER): Payer: Medicare HMO

## 2018-07-03 ENCOUNTER — Ambulatory Visit (INDEPENDENT_AMBULATORY_CARE_PROVIDER_SITE_OTHER): Payer: Medicare HMO | Admitting: Sports Medicine

## 2018-07-03 DIAGNOSIS — M7551 Bursitis of right shoulder: Secondary | ICD-10-CM

## 2018-07-03 DIAGNOSIS — M7552 Bursitis of left shoulder: Secondary | ICD-10-CM

## 2018-07-03 DIAGNOSIS — M25511 Pain in right shoulder: Secondary | ICD-10-CM | POA: Diagnosis not present

## 2018-07-03 DIAGNOSIS — I10 Essential (primary) hypertension: Secondary | ICD-10-CM

## 2018-07-03 NOTE — Assessment & Plan Note (Signed)
Recurrent right subacromial bursitis, rotator cuff is intact, there is also a bit of effusion in the biceps tendon sheath but no pain referrable to this location. Subacromial injection today, home rehab exercises given, x-rays, return in 1 month telephone visit.

## 2018-07-03 NOTE — Progress Notes (Signed)
Subjective:    CC: Right shoulder pain  HPI: This is a pleasant 68 year old female, she has a history of subacromial bursitis, a month ago she fell onto her right shoulder, had immediate pain, no bruising, no swelling, since then she is had pain over the deltoid, no radiation past the elbow, worse with abduction and overhead activities.  I reviewed the past medical history, family history, social history, surgical history, and allergies today and no changes were needed.  Please see the problem list section below in epic for further details.  Past Medical History: Past Medical History:  Diagnosis Date  . Abnormal vaginal Pap smear    monitored annually by Dr. Dory Horn  . Colon polyps    multple right colon polyps  . Degenerative joint disease   . GERD (gastroesophageal reflux disease)   . Hyperlipidemia    NMR: LDL 120 (1267/584), HDL 59, TG 60. LDL goall<130, ideally <100  . Hypertension   . Hypokalemia    on HCTZ/Triam  . PONV (postoperative nausea and vomiting)   . Vitamin D deficiency    2000 IU   Past Surgical History: Past Surgical History:  Procedure Laterality Date  . ABDOMINAL HYSTERECTOMY    . APPENDECTOMY    . BREAST BIOPSY    . BREAST SURGERY     left breast biopsy  . BUNIONECTOMY  03/2009  . CHOLECYSTECTOMY  09/2007   Lap, Dr.Gerkin   . COLONOSCOPY  2008   virtual colonoscopy  . DILATION AND CURETTAGE OF UTERUS    . ENDOMETRIAL BIOPSY  04-28-13   Dr Nori Riis  . ENDOSCOPIC CONCHA BULLOSA RESECTION Left 03/24/2013   Procedure: ENDOSCOPIC CONCHA BULLOSA RESECTION;  Surgeon: Izora Gala, MD;  Location: [place];  Service: ENT;  Laterality: Left;  . ETHMOIDECTOMY Left 03/24/2013   Procedure: ETHMOIDECTOMY;  Surgeon: Izora Gala, MD;  Location: [place];  Service: ENT;  Laterality: Left;  . HAMMER TOE SURGERY    . HYSTEROSCOPY     w/ D and C in 09/2005,02/2007,03/2008 (+ poly removal); IDU insertion 09/2008, Dr.Neal  . NEURECTOMY FOOT   03/2008   left  . NEUROMA SURGERY     left foot ,Dr.Sikora  . PARTIAL COLECTOMY Right 03/30/2016   Procedure: RIGHT COLECTOMY;  Surgeon: Armandina Gemma, MD;  Location: [place];  Service: General;  Laterality: Right;  . TOE SURGERY     for bone spur  . TUBAL LIGATION     Social History: Social History   Socioeconomic History  . Marital status: Married    Spouse name: Not on file  . Number of children: Not on file  . Years of education: Not on file  . Highest education level: Not on file  Occupational History  . Occupation: Marine scientist, [employer]   Social Needs  . Financial resource strain: Not on file  . Food insecurity:    Worry: Not on file    Inability: Not on file  . Transportation needs:    Medical: Not on file    Non-medical: Not on file  Tobacco Use  . Smoking status: Never Smoker  . Smokeless tobacco: Never Used  Substance and Sexual Activity  . Alcohol use: Yes    Comment: occasional  . Drug use: No  . Sexual activity: Not on file  Lifestyle  . Physical activity:    Days per week: Not on file    Minutes per session: Not on file  . Stress: Not on file  Relationships  .  Social connections:    Talks on phone: Not on file    Gets together: Not on file    Attends religious service: Not on file    Active member of club or organization: Not on file    Attends meetings of clubs or organizations: Not on file    Relationship status: Not on file  Other Topics Concern  . Not on file  Social History Narrative   Heart Healthy Diet         Family History: Family History  Problem Relation Age of Onset  . Heart attack Father 55       CABG 4-vessel  . Diabetes Father        late onset  . Lung cancer Mother        former smoker  . Diabetes Mother        late onset  . Breast cancer Mother   . Obesity Sister   . Stroke Paternal Grandfather 28  . Lung cancer Maternal Grandfather        former smoker  . Breast cancer Maternal Aunt   . Heart attack Paternal Grandmother  30  . Diabetes type I Unknown        grand daughter   Allergies: Allergies  Allergen Reactions  . Hydrochlorothiazide W-Triamterene Other (See Comments)    hypokalemia  . Povidone-Iodine Itching    Intolerance, if exposed over a long period.  Ok if used and washed off quickly.   Medications: See med rec.  Review of Systems: No fevers, chills, night sweats, weight loss, chest pain, or shortness of breath.   Objective:    General: Well Developed, well nourished, and in no acute distress.  Neuro: Alert and oriented x3, extra-ocular muscles intact, sensation grossly intact.  HEENT: Normocephalic, atraumatic, pupils equal round reactive to light, neck supple, no masses, no lymphadenopathy, thyroid nonpalpable.  Skin: Warm and dry, no rashes. Cardiac: Regular rate and rhythm, no murmurs rubs or gallops, no lower extremity edema.  Respiratory: Clear to auscultation bilaterally. Not using accessory muscles, speaking in full sentences. Right shoulder: Inspection reveals no abnormalities, atrophy or asymmetry. Palpation is normal with no tenderness over AC joint or bicipital groove. ROM is full in all planes. Rotator cuff strength normal throughout. Positive Neer and Hawkin's tests, empty can. Speeds and Yergason's tests normal. No labral pathology noted with negative Obrien's, negative crank, negative clunk, and good stability. Normal scapular function observed. No painful arc and no drop arm sign. No apprehension sign  Procedure: Real-time Ultrasound Guided injection of the right subacromial bursa Device: GE Logiq E  Verbal informed consent obtained.  Time-out conducted.  Noted no overlying erythema, induration, or other signs of local infection.  Skin prepped in a sterile fashion.  Local anesthesia: Topical Ethyl chloride.  With sterile technique and under real time ultrasound guidance:  Noted intact supraspinatus, infraspinatus, subscapularis, teres minor, there was an effusion  around the long head of the biceps but no pain referrable to this.  I advanced a 25-gauge needle into the subacromial 1 cc Kenalog 40, 1 cc lidocaine, 1 cc bupivacaine. Completed without difficulty  Pain immediately resolved suggesting accurate placement of the medication.  Advised to call if fevers/chills, erythema, induration, drainage, or persistent bleeding.  Images permanently stored and available for review in the ultrasound unit.  Impression: Technically successful ultrasound guided injection.  Impression and Recommendations:    Bilateral shoulder bursitis Recurrent right subacromial bursitis, rotator cuff is intact, there is also a bit of  effusion in the biceps tendon sheath but no pain referrable to this location. Subacromial injection today, home rehab exercises given, x-rays, return in 1 month telephone visit.   ___________________________________________ Gwen Her. Dianah Field, M.D., ABFM., CAQSM. Primary Care and Sports Medicine [place] [facility]  Adjunct Professor of [place] [employer] [facility] of Medicine

## 2018-07-15 ENCOUNTER — Other Ambulatory Visit: Payer: Self-pay | Admitting: Sports Medicine

## 2018-07-15 MED ORDER — TIZANIDINE HCL 4 MG PO TABS: 4.0000 mg | ORAL_TABLET | Freq: Four times a day (QID) | ORAL | 0 refills | Status: DC | PRN

## 2018-07-31 ENCOUNTER — Encounter: Payer: Self-pay | Admitting: Sports Medicine

## 2018-07-31 ENCOUNTER — Ambulatory Visit (INDEPENDENT_AMBULATORY_CARE_PROVIDER_SITE_OTHER): Payer: Medicare HMO | Admitting: Sports Medicine

## 2018-07-31 DIAGNOSIS — M7552 Bursitis of left shoulder: Secondary | ICD-10-CM

## 2018-07-31 DIAGNOSIS — M7551 Bursitis of right shoulder: Secondary | ICD-10-CM | POA: Diagnosis not present

## 2018-07-31 NOTE — Assessment & Plan Note (Signed)
We injected her right subacromial bursa 1 month ago, at that time there was also somewhat of an effusion in the biceps tendon sheath. Her impingement symptoms are gone but she still has anterior shoulder pain consistent with biceps tendinitis with a positive speeds test. We injected her biceps tendon sheath today, return in a month, MRI if still having discomfort.

## 2018-07-31 NOTE — Progress Notes (Signed)
Subjective:    CC: Recheck right shoulder pain  HPI: This is a pleasant 68 year old female, we treated her subacromial bursitis on the right at the last visit with a subacromial injection, impingement symptoms have resolved, she continues to have some pain over the anterior aspect of the shoulder, we did see a biceps tendon sheath effusion at the last visit.  Symptoms are moderate, persistent, localized anteriorly without radiation.  Worse with abduction and flexion of the shoulder.  I reviewed the past medical history, family history, social history, surgical history, and allergies today and no changes were needed.  Please see the problem list section below in epic for further details.  Past Medical History: Past Medical History:  Diagnosis Date  . Abnormal vaginal Pap smear    monitored annually by Dr. Dory Horn  . Colon polyps    multple right colon polyps  . Degenerative joint disease   . GERD (gastroesophageal reflux disease)   . Hyperlipidemia    NMR: LDL 120 (1267/584), HDL 59, TG 60. LDL goall<130, ideally <100  . Hypertension   . Hypokalemia    on HCTZ/Triam  . PONV (postoperative nausea and vomiting)   . Vitamin D deficiency    2000 IU   Past Surgical History: Past Surgical History:  Procedure Laterality Date  . ABDOMINAL HYSTERECTOMY    . APPENDECTOMY    . BREAST BIOPSY    . BREAST SURGERY     left breast biopsy  . BUNIONECTOMY  03/2009  . CHOLECYSTECTOMY  09/2007   Lap, Dr.Gerkin   . COLONOSCOPY  2008   virtual colonoscopy  . DILATION AND CURETTAGE OF UTERUS    . ENDOMETRIAL BIOPSY  04-28-13   Dr Nori Riis  . ENDOSCOPIC CONCHA BULLOSA RESECTION Left 03/24/2013   Procedure: ENDOSCOPIC CONCHA BULLOSA RESECTION;  Surgeon: Izora Gala, MD;  Location: [facility];  Service: ENT;  Laterality: Left;  . ETHMOIDECTOMY Left 03/24/2013   Procedure: ETHMOIDECTOMY;  Surgeon: Izora Gala, MD;  Location: [place];  Service: ENT;  Laterality: Left;  .  HAMMER TOE SURGERY    . HYSTEROSCOPY     w/ D and C in 09/2005,02/2007,03/2008 (+ poly removal); IDU insertion 09/2008, Dr.Neal  . NEURECTOMY FOOT  03/2008   left  . NEUROMA SURGERY     left foot ,Dr.Sikora  . PARTIAL COLECTOMY Right 03/30/2016   Procedure: RIGHT COLECTOMY;  Surgeon: Armandina Gemma, MD;  Location: [place];  Service: General;  Laterality: Right;  . TOE SURGERY     for bone spur  . TUBAL LIGATION     Social History: Social History   Socioeconomic History  . Marital status: Married    Spouse name: Not on file  . Number of children: Not on file  . Years of education: Not on file  . Highest education level: Not on file  Occupational History  . Occupation: Marine scientist, [employer]   Social Needs  . Financial resource strain: Not on file  . Food insecurity:    Worry: Not on file    Inability: Not on file  . Transportation needs:    Medical: Not on file    Non-medical: Not on file  Tobacco Use  . Smoking status: Never Smoker  . Smokeless tobacco: Never Used  Substance and Sexual Activity  . Alcohol use: Yes    Comment: occasional  . Drug use: No  . Sexual activity: Not on file  Lifestyle  . Physical activity:    Days per  week: Not on file    Minutes per session: Not on file  . Stress: Not on file  Relationships  . Social connections:    Talks on phone: Not on file    Gets together: Not on file    Attends religious service: Not on file    Active member of club or organization: Not on file    Attends meetings of clubs or organizations: Not on file    Relationship status: Not on file  Other Topics Concern  . Not on file  Social History Narrative   Heart Healthy Diet         Family History: Family History  Problem Relation Age of Onset  . Heart attack Father 32       CABG 4-vessel  . Diabetes Father        late onset  . Lung cancer Mother        former smoker  . Diabetes Mother        late onset  . Breast cancer Mother   . Obesity Sister   . Stroke  Paternal Grandfather 19  . Lung cancer Maternal Grandfather        former smoker  . Breast cancer Maternal Aunt   . Heart attack Paternal Grandmother 57  . Diabetes type I Unknown        grand daughter   Allergies: Allergies  Allergen Reactions  . Hydrochlorothiazide W-Triamterene Other (See Comments)    hypokalemia  . Povidone-Iodine Itching    Intolerance, if exposed over a long period.  Ok if used and washed off quickly.   Medications: See med rec.  Review of Systems: No fevers, chills, night sweats, weight loss, chest pain, or shortness of breath.   Objective:    General: Well Developed, well nourished, and in no acute distress.  Neuro: Alert and oriented x3, extra-ocular muscles intact, sensation grossly intact.  HEENT: Normocephalic, atraumatic, pupils equal round reactive to light, neck supple, no masses, no lymphadenopathy, thyroid nonpalpable.  Skin: Warm and dry, no rashes. Cardiac: Regular rate and rhythm, no murmurs rubs or gallops, no lower extremity edema.  Respiratory: Clear to auscultation bilaterally. Not using accessory muscles, speaking in full sentences. Right shoulder: Inspection reveals no abnormalities, atrophy or asymmetry. Palpation is normal with no tenderness over AC joint or bicipital groove. ROM is full in all planes. Rotator cuff strength normal throughout. No signs of impingement with negative Neer and Hawkin's tests, empty can. Normal Yergason test, abnormal speeds test. No labral pathology noted with negative Obrien's, negative crank, negative clunk, and good stability. Normal scapular function observed. No painful arc and no drop arm sign. No apprehension sign  Procedure: Real-time Ultrasound Guided injection of the right biceps tendon sheath Device: GE Logiq E  Verbal informed consent obtained.  Time-out conducted.  Noted no overlying erythema, induration, or other signs of local infection.  Skin prepped in a sterile fashion.  Local  anesthesia: Topical Ethyl chloride.  With sterile technique and under real time ultrasound guidance:  25-gauge needle advanced into the biceps sheath, I then injected 1 cc Kenalog 40, 1 cc lidocaine, 1 cc bupivacaine. Completed without difficulty  Pain immediately resolved suggesting accurate placement of the medication.  Advised to call if fevers/chills, erythema, induration, drainage, or persistent bleeding.  Images permanently stored and available for review in the ultrasound unit.  Impression: Technically successful ultrasound guided injection.  Impression and Recommendations:    Bilateral shoulder bursitis We injected her right subacromial bursa 1  month ago, at that time there was also somewhat of an effusion in the biceps tendon sheath. Her impingement symptoms are gone but she still has anterior shoulder pain consistent with biceps tendinitis with a positive speeds test. We injected her biceps tendon sheath today, return in a month, MRI if still having discomfort.   ___________________________________________ Gwen Her. Dianah Field, M.D., ABFM., CAQSM. Primary Care and Sports Medicine [facility]  Adjunct Professor of [place] [employer] [facility] of Medicine

## 2018-08-01 ENCOUNTER — Ambulatory Visit: Payer: Medicare HMO | Admitting: Sports Medicine

## 2018-08-02 ENCOUNTER — Other Ambulatory Visit: Payer: Self-pay | Admitting: Sports Medicine

## 2018-08-02 DIAGNOSIS — Z1231 Encounter for screening mammogram for malignant neoplasm of breast: Secondary | ICD-10-CM

## 2018-08-18 ENCOUNTER — Other Ambulatory Visit: Payer: Self-pay | Admitting: Sports Medicine

## 2018-08-28 ENCOUNTER — Encounter: Payer: Self-pay | Admitting: Sports Medicine

## 2018-08-28 ENCOUNTER — Ambulatory Visit (INDEPENDENT_AMBULATORY_CARE_PROVIDER_SITE_OTHER): Payer: Medicare HMO | Admitting: Sports Medicine

## 2018-08-28 DIAGNOSIS — M7552 Bursitis of left shoulder: Secondary | ICD-10-CM

## 2018-08-28 DIAGNOSIS — M7551 Bursitis of right shoulder: Secondary | ICD-10-CM

## 2018-08-28 NOTE — Assessment & Plan Note (Signed)
Subacromial bursa in April, biceps sheath injection in May, now pain-free. She will continue to keep activities below the shoulders, and continue rehab. She does play softball, third base, and sometimes long throws to 1st base can hurt her shoulder.

## 2018-08-28 NOTE — Progress Notes (Signed)
Subjective:    CC: Follow-up  HPI: Shoulder pain: Resolved.  I reviewed the past medical history, family history, social history, surgical history, and allergies today and no changes were needed.  Please see the problem list section below in epic for further details.  Past Medical History: Past Medical History:  Diagnosis Date  . Abnormal vaginal Pap smear    monitored annually by Dr. Dory Horn  . Colon polyps    multple right colon polyps  . Degenerative joint disease   . GERD (gastroesophageal reflux disease)   . Hyperlipidemia    NMR: LDL 120 (1267/584), HDL 59, TG 60. LDL goall<130, ideally <100  . Hypertension   . Hypokalemia    on HCTZ/Triam  . PONV (postoperative nausea and vomiting)   . Vitamin D deficiency    2000 IU   Past Surgical History: Past Surgical History:  Procedure Laterality Date  . ABDOMINAL HYSTERECTOMY    . APPENDECTOMY    . BREAST BIOPSY    . BREAST SURGERY     left breast biopsy  . BUNIONECTOMY  03/2009  . CHOLECYSTECTOMY  09/2007   Lap, Dr.Gerkin   . COLONOSCOPY  2008   virtual colonoscopy  . DILATION AND CURETTAGE OF UTERUS    . ENDOMETRIAL BIOPSY  04-28-13   Dr Nori Riis  . ENDOSCOPIC CONCHA BULLOSA RESECTION Left 03/24/2013   Procedure: ENDOSCOPIC CONCHA BULLOSA RESECTION;  Surgeon: Izora Gala, MD;  Location: [place];  Service: ENT;  Laterality: Left;  . ETHMOIDECTOMY Left 03/24/2013   Procedure: ETHMOIDECTOMY;  Surgeon: Izora Gala, MD;  Location: [place];  Service: ENT;  Laterality: Left;  . HAMMER TOE SURGERY    . HYSTEROSCOPY     w/ D and C in 09/2005,02/2007,03/2008 (+ poly removal); IDU insertion 09/2008, Dr.Neal  . NEURECTOMY FOOT  03/2008   left  . NEUROMA SURGERY     left foot ,Dr.Sikora  . PARTIAL COLECTOMY Right 03/30/2016   Procedure: RIGHT COLECTOMY;  Surgeon: Armandina Gemma, MD;  Location: [place];  Service: General;  Laterality: Right;  . TOE SURGERY     for bone spur  . TUBAL LIGATION     Social  History: Social History   Socioeconomic History  . Marital status: Married    Spouse name: Not on file  . Number of children: Not on file  . Years of education: Not on file  . Highest education level: Not on file  Occupational History  . Occupation: Marine scientist, [employer]   Social Needs  . Financial resource strain: Not on file  . Food insecurity:    Worry: Not on file    Inability: Not on file  . Transportation needs:    Medical: Not on file    Non-medical: Not on file  Tobacco Use  . Smoking status: Never Smoker  . Smokeless tobacco: Never Used  Substance and Sexual Activity  . Alcohol use: Yes    Comment: occasional  . Drug use: No  . Sexual activity: Not on file  Lifestyle  . Physical activity:    Days per week: Not on file    Minutes per session: Not on file  . Stress: Not on file  Relationships  . Social connections:    Talks on phone: Not on file    Gets together: Not on file    Attends religious service: Not on file    Active member of club or organization: Not on file    Attends meetings of  clubs or organizations: Not on file    Relationship status: Not on file  Other Topics Concern  . Not on file  Social History Narrative   Heart Healthy Diet         Family History: Family History  Problem Relation Age of Onset  . Heart attack Father 23       CABG 4-vessel  . Diabetes Father        late onset  . Lung cancer Mother        former smoker  . Diabetes Mother        late onset  . Breast cancer Mother   . Obesity Sister   . Stroke Paternal Grandfather 2  . Lung cancer Maternal Grandfather        former smoker  . Breast cancer Maternal Aunt   . Heart attack Paternal Grandmother 36  . Diabetes type I Unknown        grand daughter   Allergies: Allergies  Allergen Reactions  . Hydrochlorothiazide W-Triamterene Other (See Comments)    hypokalemia  . Povidone-Iodine Itching    Intolerance, if exposed over a long period.  Ok if used and washed off  quickly.   Medications: See med rec.  Review of Systems: No fevers, chills, night sweats, weight loss, chest pain, or shortness of breath.   Objective:    General: Well Developed, well nourished, and in no acute distress.  Neuro: Alert and oriented x3, extra-ocular muscles intact, sensation grossly intact.  HEENT: Normocephalic, atraumatic, pupils equal round reactive to light, neck supple, no masses, no lymphadenopathy, thyroid nonpalpable.  Skin: Warm and dry, no rashes. Cardiac: Regular rate and rhythm, no murmurs rubs or gallops, no lower extremity edema.  Respiratory: Clear to auscultation bilaterally. Not using accessory muscles, speaking in full sentences.  Impression and Recommendations:    Bilateral shoulder bursitis Subacromial bursa in April, biceps sheath injection in May, now pain-free. She will continue to keep activities below the shoulders, and continue rehab. She does play softball, third base, and sometimes long throws to 1st base can hurt her shoulder.   ___________________________________________ Gwen Her. Dianah Field, M.D., ABFM., CAQSM. Primary Care and Sports Medicine [facility]  Adjunct Professor of [place] [employer] [facility] of Medicine

## 2018-08-31 ENCOUNTER — Ambulatory Visit: Payer: Medicare HMO

## 2018-10-02 ENCOUNTER — Other Ambulatory Visit: Payer: Self-pay | Admitting: Sports Medicine

## 2018-10-02 DIAGNOSIS — Z1231 Encounter for screening mammogram for malignant neoplasm of breast: Secondary | ICD-10-CM

## 2018-10-03 ENCOUNTER — Ambulatory Visit (INDEPENDENT_AMBULATORY_CARE_PROVIDER_SITE_OTHER): Payer: Medicare HMO

## 2018-10-03 ENCOUNTER — Other Ambulatory Visit: Payer: Self-pay

## 2018-10-03 DIAGNOSIS — Z1231 Encounter for screening mammogram for malignant neoplasm of breast: Secondary | ICD-10-CM

## 2018-11-05 DIAGNOSIS — D692 Other nonthrombocytopenic purpura: Secondary | ICD-10-CM | POA: Diagnosis not present

## 2018-11-05 DIAGNOSIS — D1801 Hemangioma of skin and subcutaneous tissue: Secondary | ICD-10-CM | POA: Diagnosis not present

## 2018-11-05 DIAGNOSIS — L821 Other seborrheic keratosis: Secondary | ICD-10-CM | POA: Diagnosis not present

## 2018-11-05 DIAGNOSIS — L820 Inflamed seborrheic keratosis: Secondary | ICD-10-CM | POA: Diagnosis not present

## 2018-11-05 DIAGNOSIS — D485 Neoplasm of uncertain behavior of skin: Secondary | ICD-10-CM | POA: Diagnosis not present

## 2018-11-05 DIAGNOSIS — L570 Actinic keratosis: Secondary | ICD-10-CM | POA: Diagnosis not present

## 2018-11-05 DIAGNOSIS — D225 Melanocytic nevi of trunk: Secondary | ICD-10-CM | POA: Diagnosis not present

## 2018-11-05 DIAGNOSIS — L814 Other melanin hyperpigmentation: Secondary | ICD-10-CM | POA: Diagnosis not present

## 2018-12-23 ENCOUNTER — Other Ambulatory Visit: Payer: Self-pay | Admitting: Sports Medicine

## 2018-12-23 DIAGNOSIS — I10 Essential (primary) hypertension: Secondary | ICD-10-CM

## 2018-12-31 ENCOUNTER — Ambulatory Visit (INDEPENDENT_AMBULATORY_CARE_PROVIDER_SITE_OTHER): Payer: Medicare HMO | Admitting: Sports Medicine

## 2018-12-31 ENCOUNTER — Encounter: Payer: Self-pay | Admitting: Sports Medicine

## 2018-12-31 ENCOUNTER — Other Ambulatory Visit: Payer: Self-pay

## 2018-12-31 VITALS — BP 115/72 | HR 64 | Wt 165.0 lb

## 2018-12-31 DIAGNOSIS — Z23 Encounter for immunization: Secondary | ICD-10-CM

## 2018-12-31 DIAGNOSIS — M47816 Spondylosis without myelopathy or radiculopathy, lumbar region: Secondary | ICD-10-CM | POA: Diagnosis not present

## 2018-12-31 DIAGNOSIS — M7551 Bursitis of right shoulder: Secondary | ICD-10-CM

## 2018-12-31 DIAGNOSIS — M7552 Bursitis of left shoulder: Secondary | ICD-10-CM | POA: Diagnosis not present

## 2018-12-31 NOTE — Assessment & Plan Note (Signed)
Left SI joint injection was last performed in February 2019, now with recurrence of pain in the same location, repeat injection today, return as needed.

## 2018-12-31 NOTE — Progress Notes (Signed)
Subjective:    CC: L hip and R shoulder pain  HPI: [name] is a pleasant 68 yo female with a history significant for Lumbar spondylosis with sacroiliac joint dysfunction and  who presents today with 3 months of new onset L hip pain (last injected 04/2017) and a return of R shoulder pain (last injected 07/2018). Her hip pain is her primary concern and is worse with prolonged sitting. She has not experienced any radiation down her leg. Her shoulder pain has gradually returned since her last injection and similar to prior symptoms. It is bothersome but does not prevent her from engaging in her active lifestyle.    I reviewed the past medical history, family history, social history, surgical history, and allergies today and no changes were needed.  Please see the problem list section below in epic for further details.  Past Medical History: Past Medical History:  Diagnosis Date  . Abnormal vaginal Pap smear    monitored annually by Dr. Dory Horn  . Colon polyps    multple right colon polyps  . Degenerative joint disease   . GERD (gastroesophageal reflux disease)   . Hyperlipidemia    NMR: LDL 120 (1267/584), HDL 59, TG 60. LDL goall<130, ideally <100  . Hypertension   . Hypokalemia    on HCTZ/Triam  . PONV (postoperative nausea and vomiting)   . Vitamin D deficiency    2000 IU   Past Surgical History: Past Surgical History:  Procedure Laterality Date  . ABDOMINAL HYSTERECTOMY    . APPENDECTOMY    . BREAST BIOPSY    . BREAST SURGERY     left breast biopsy  . BUNIONECTOMY  03/2009  . CHOLECYSTECTOMY  09/2007   Lap, Dr.Gerkin   . COLONOSCOPY  2008   virtual colonoscopy  . DILATION AND CURETTAGE OF UTERUS    . ENDOMETRIAL BIOPSY  04-28-13   Dr Nori Riis  . ENDOSCOPIC CONCHA BULLOSA RESECTION Left 03/24/2013   Procedure: ENDOSCOPIC CONCHA BULLOSA RESECTION;  Surgeon: Izora Gala, MD;  Location: [place];  Service: ENT;  Laterality: Left;  . ETHMOIDECTOMY Left 03/24/2013   Procedure: ETHMOIDECTOMY;  Surgeon: Izora Gala, MD;  Location: [place];  Service: ENT;  Laterality: Left;  . HAMMER TOE SURGERY    . HYSTEROSCOPY     w/ D and C in 09/2005,02/2007,03/2008 (+ poly removal); IDU insertion 09/2008, Dr.Neal  . NEURECTOMY FOOT  03/2008   left  . NEUROMA SURGERY     left foot ,Dr.Sikora  . PARTIAL COLECTOMY Right 03/30/2016   Procedure: RIGHT COLECTOMY;  Surgeon: Armandina Gemma, MD;  Location: [place];  Service: General;  Laterality: Right;  . TOE SURGERY     for bone spur  . TUBAL LIGATION     Social History: Social History   Socioeconomic History  . Marital status: Married    Spouse name: Not on file  . Number of children: Not on file  . Years of education: Not on file  . Highest education level: Not on file  Occupational History  . Occupation: Marine scientist, [employer]   Social Needs  . Financial resource strain: Not on file  . Food insecurity    Worry: Not on file    Inability: Not on file  . Transportation needs    Medical: Not on file    Non-medical: Not on file  Tobacco Use  . Smoking status: Never Smoker  . Smokeless tobacco: Never Used  Substance and Sexual Activity  . Alcohol  use: Yes    Comment: occasional  . Drug use: No  . Sexual activity: Not on file  Lifestyle  . Physical activity    Days per week: Not on file    Minutes per session: Not on file  . Stress: Not on file  Relationships  . Social Herbalist on phone: Not on file    Gets together: Not on file    Attends religious service: Not on file    Active member of club or organization: Not on file    Attends meetings of clubs or organizations: Not on file    Relationship status: Not on file  Other Topics Concern  . Not on file  Social History Narrative   Heart Healthy Diet         Family History: Family History  Problem Relation Age of Onset  . Heart attack Father 51       CABG 4-vessel  . Diabetes Father        late onset  . Lung cancer Mother         former smoker  . Diabetes Mother        late onset  . Breast cancer Mother   . Obesity Sister   . Stroke Paternal Grandfather 38  . Lung cancer Maternal Grandfather        former smoker  . Breast cancer Maternal Aunt   . Heart attack Paternal Grandmother 85  . Diabetes type I Unknown        grand daughter   Allergies: Allergies  Allergen Reactions  . Hydrochlorothiazide W-Triamterene Other (See Comments)    hypokalemia  . Povidone-Iodine Itching    Intolerance, if exposed over a long period.  Ok if used and washed off quickly.   Medications: See med rec.  Review of Systems: No fevers, chills, night sweats, weight loss, chest pain, or shortness of breath.   Objective:    General: Well Developed, well nourished, and in no acute distress.  Neuro: Alert and oriented x3, extra-ocular muscles intact, sensation grossly intact.  HEENT: Normocephalic, atraumatic. Skin: Warm and dry, no rashes. Cardiac: Regular rate and rhythm, no lower extremity edema.  Respiratory: Not using accessory muscles, speaking in full sentences.  R Shoulder: Inspection reveals no abnormalities, atrophy or asymmetry. Palpation reveals tenderness over bicipital groove. Non-tender over [facility] joint ROM is limited and unable to perform scratch test.  Rotator cuff strength normal throughout. Signs of impingement with positive Neer and Hawkin's tests. Negative eempty can sign. Speeds test normal.  L Hip: ROM IR: 45 Deg, ER: 45 Deg, Flexion: 120 Deg, Extension: 100 Deg, Abduction: 45 Deg, Adduction: 45 Deg Strength IR: 5/5, ER: 5/5, Flexion: 5/5, Extension: 5/5, Abduction: 5/5, Adduction: 5/5 Pelvic alignment unremarkable to inspection and palpation. Standing hip rotation and gait without trendelenburg sign / unsteadiness. Greater trochanter without tenderness to palpation. No tenderness over piriformis. SI joint tenderness and normal minimal SI movement.  A/P: The presence of localized pain over  the posterior L hip which is worsened with prolonged sitting is concerning for disc pathology or impingement of the sciatic nerve. However, with presence of tenderness to palpation over SI joint this points towards sciatica. She was treated with a steroid injection over the L SI joint which elicited her pain.  Impression and Recommendations:    Lumbar spondylosis with sacroiliac joint dysfunction Left SI joint injection was last performed in February 2019, now with recurrence of pain in the same location, repeat injection  today, return as needed.  Bilateral shoulder bursitis Last subacromial injection was in May 2020. She was doing extremely well, she has slacked off her rehab, she will get more diligent with the rehab over the next 4 to 6 weeks and we can repeat the injection if persistent discomfort. She does play softball, third base, sometimes long throws to first base can hurt her shoulder.   ___________________________________________ Gwen Her. Dianah Field, M.D., ABFM., CAQSM. Primary Care and Sports Medicine [facility]  Adjunct Professor of [place] [employer] [facility] of Medicine

## 2018-12-31 NOTE — Assessment & Plan Note (Signed)
Last subacromial injection was in May 2020. She was doing extremely well, she has slacked off her rehab, she will get more diligent with the rehab over the next 4 to 6 weeks and we can repeat the injection if persistent discomfort. She does play softball, third base, sometimes long throws to first base can hurt her shoulder.

## 2018-12-31 NOTE — Progress Notes (Signed)
Subjective:    CC:   HPI:   I reviewed the past medical history, family history, social history, surgical history, and allergies today and no changes were needed.  Please see the problem list section below in epic for further details.  Past Medical History: Past Medical History:  Diagnosis Date  . Abnormal vaginal Pap smear    monitored annually by Dr. Dory Horn  . Colon polyps    multple right colon polyps  . Degenerative joint disease   . GERD (gastroesophageal reflux disease)   . Hyperlipidemia    NMR: LDL 120 (1267/584), HDL 59, TG 60. LDL goall<130, ideally <100  . Hypertension   . Hypokalemia    on HCTZ/Triam  . PONV (postoperative nausea and vomiting)   . Vitamin D deficiency    2000 IU   Past Surgical History: Past Surgical History:  Procedure Laterality Date  . ABDOMINAL HYSTERECTOMY    . APPENDECTOMY    . BREAST BIOPSY    . BREAST SURGERY     left breast biopsy  . BUNIONECTOMY  03/2009  . CHOLECYSTECTOMY  09/2007   Lap, Dr.Gerkin   . COLONOSCOPY  2008   virtual colonoscopy  . DILATION AND CURETTAGE OF UTERUS    . ENDOMETRIAL BIOPSY  04-28-13   Dr Nori Riis  . ENDOSCOPIC CONCHA BULLOSA RESECTION Left 03/24/2013   Procedure: ENDOSCOPIC CONCHA BULLOSA RESECTION;  Surgeon: Izora Gala, MD;  Location: [facility];  Service: ENT;  Laterality: Left;  . ETHMOIDECTOMY Left 03/24/2013   Procedure: ETHMOIDECTOMY;  Surgeon: Izora Gala, MD;  Location: [facility];  Service: ENT;  Laterality: Left;  . HAMMER TOE SURGERY    . HYSTEROSCOPY     w/ D and C in 09/2005,02/2007,03/2008 (+ poly removal); IDU insertion 09/2008, Dr.Neal  . NEURECTOMY FOOT  03/2008   left  . NEUROMA SURGERY     left foot ,Dr.Sikora  . PARTIAL COLECTOMY Right 03/30/2016   Procedure: RIGHT COLECTOMY;  Surgeon: Armandina Gemma, MD;  Location: [facility];  Service: General;  Laterality: Right;  . TOE SURGERY     for bone spur  . TUBAL LIGATION     Social History: Social History    Socioeconomic History  . Marital status: Married    Spouse name: Not on file  . Number of children: Not on file  . Years of education: Not on file  . Highest education level: Not on file  Occupational History  . Occupation: Marine scientist, [employer]   Social Needs  . Financial resource strain: Not on file  . Food insecurity    Worry: Not on file    Inability: Not on file  . Transportation needs    Medical: Not on file    Non-medical: Not on file  Tobacco Use  . Smoking status: Never Smoker  . Smokeless tobacco: Never Used  Substance and Sexual Activity  . Alcohol use: Yes    Comment: occasional  . Drug use: No  . Sexual activity: Not on file  Lifestyle  . Physical activity    Days per week: Not on file    Minutes per session: Not on file  . Stress: Not on file  Relationships  . Social Herbalist on phone: Not on file    Gets together: Not on file    Attends religious service: Not on file    Active member of club or organization: Not on file    Attends meetings of clubs or  organizations: Not on file    Relationship status: Not on file  Other Topics Concern  . Not on file  Social History Narrative   Heart Healthy Diet         Family History: Family History  Problem Relation Age of Onset  . Heart attack Father 22       CABG 4-vessel  . Diabetes Father        late onset  . Lung cancer Mother        former smoker  . Diabetes Mother        late onset  . Breast cancer Mother   . Obesity Sister   . Stroke Paternal Grandfather 70  . Lung cancer Maternal Grandfather        former smoker  . Breast cancer Maternal Aunt   . Heart attack Paternal Grandmother 52  . Diabetes type I Unknown        grand daughter   Allergies: Allergies  Allergen Reactions  . Hydrochlorothiazide W-Triamterene Other (See Comments)    hypokalemia  . Povidone-Iodine Itching    Intolerance, if exposed over a long period.  Ok if used and washed off quickly.   Medications: See med  rec.  Review of Systems: No fevers, chills, night sweats, weight loss, chest pain, or shortness of breath.   Objective:    General: Well Developed, well nourished, and in no acute distress.  Neuro: Alert and oriented x3, extra-ocular muscles intact, sensation grossly intact.  HEENT: Normocephalic, atraumatic, pupils equal round reactive to light, neck supple, no masses, no lymphadenopathy, thyroid nonpalpable.  Skin: Warm and dry, no rashes. Cardiac: Regular rate and rhythm, no murmurs rubs or gallops, no lower extremity edema.  Respiratory: Clear to auscultation bilaterally. Not using accessory muscles, speaking in full sentences.   Impression and Recommendations:    No problem-specific Assessment & Plan notes found for this encounter.   ___________________________________________ Gwen Her. Dianah Field, M.D., ABFM., CAQSM. Primary Care and Sports Medicine [facility]  Adjunct Professor of [place] of [facility] of Medicine

## 2019-01-16 IMAGING — XA DG INJECT/[PERSON_NAME] INC NEEDLE/CATH/PLC EPI/CERV/THOR W/IMG
1 series · 1 of 1 positions shown · non-contrast
Comparison: none

CLINICAL DATA: Cervical spondylosis without myelopathy. RIGHT arm
radicular symptoms.

[Series 6: ortho standard · 1 of 1 slices shown]
[im 1/1]
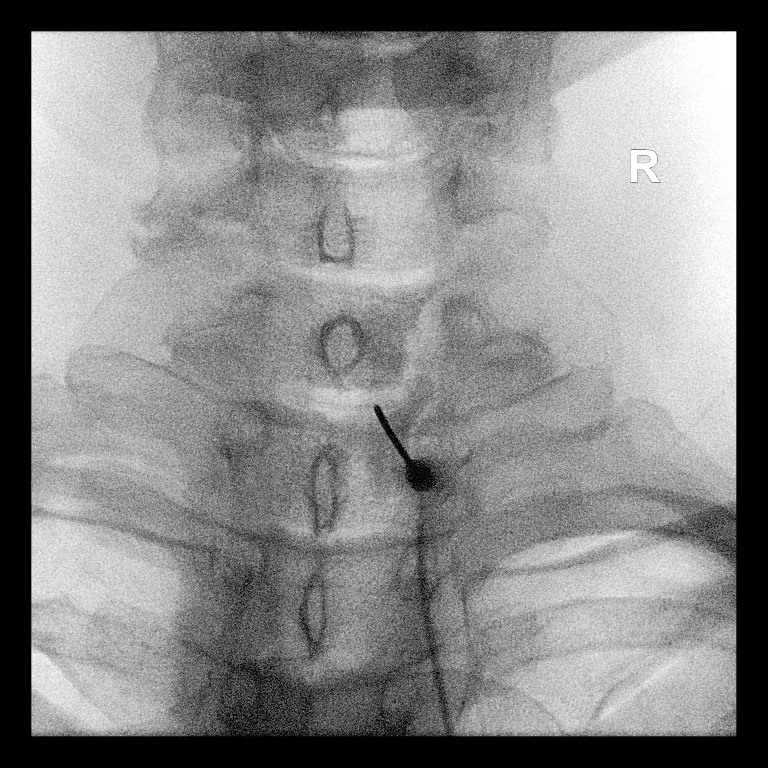

[1 of 1 positions shown; findings below may reference images not displayed]

FLUOROSCOPY TIME:  17 seconds corresponding to a Dose Area Product
of 5.3 ?Gy*m2

PROCEDURE:
CERVICAL EPIDURAL INJECTION

An interlaminar approach was performed on the RIGHT at C7-T1 . A 20
gauge epidural needle was advanced using loss-of-resistance
technique.

DIAGNOSTIC EPIDURAL INJECTION

Injection of Isovue-M 300 shows a good epidural pattern with spread
above and below the level of needle placement, primarily on the
RIGHT. No vascular opacification is seen.

THERAPEUTIC EPIDURAL INJECTION

1.5 ml of Kenalog 40 mixed with 1 ml of 1% Lidocaine and 2 ml of
normal saline were then instilled. The procedure was well-tolerated,
and the patient was discharged thirty minutes following the
injection in good condition.
IMPRESSION: Technically successful second epidural injection on the RIGHT at
C7-T1.

## 2019-03-28 DIAGNOSIS — H5203 Hypermetropia, bilateral: Secondary | ICD-10-CM | POA: Diagnosis not present

## 2019-03-28 DIAGNOSIS — H524 Presbyopia: Secondary | ICD-10-CM | POA: Diagnosis not present

## 2019-03-28 DIAGNOSIS — H52223 Regular astigmatism, bilateral: Secondary | ICD-10-CM | POA: Diagnosis not present

## 2019-04-08 ENCOUNTER — Other Ambulatory Visit: Payer: Self-pay

## 2019-04-08 ENCOUNTER — Ambulatory Visit (INDEPENDENT_AMBULATORY_CARE_PROVIDER_SITE_OTHER): Payer: Medicare HMO | Admitting: Sports Medicine

## 2019-04-08 DIAGNOSIS — Z23 Encounter for immunization: Secondary | ICD-10-CM | POA: Diagnosis not present

## 2019-04-08 DIAGNOSIS — M47816 Spondylosis without myelopathy or radiculopathy, lumbar region: Secondary | ICD-10-CM | POA: Diagnosis not present

## 2019-04-08 DIAGNOSIS — Z0000 Encounter for general adult medical examination without abnormal findings: Secondary | ICD-10-CM

## 2019-04-08 DIAGNOSIS — M7552 Bursitis of left shoulder: Secondary | ICD-10-CM

## 2019-04-08 DIAGNOSIS — M7551 Bursitis of right shoulder: Secondary | ICD-10-CM

## 2019-04-08 MED ORDER — TETANUS-DIPHTH-ACELL PERTUSSIS 5-2.5-18.5 LF-MCG/0.5 IM SUSP: 0.5000 mL | Freq: Once | INTRAMUSCULAR | 0 refills | Status: AC

## 2019-04-08 NOTE — Addendum Note (Signed)
Addended by: Beatris Ship L on: 04/08/2019 10:41 AM   Modules accepted: Orders

## 2019-04-08 NOTE — Progress Notes (Addendum)
    Procedures performed today:    Procedure: Real-time Ultrasound Guided injection of the right subacromial bursa Device: Samsung HS60  Verbal informed consent obtained.  Time-out conducted.  Noted no overlying erythema, induration, or other signs of local infection.  Skin prepped in a sterile fashion.  Local anesthesia: Topical Ethyl chloride.  With sterile technique and under real time ultrasound guidance: 1 cc Kenalog 40, 1 cc lidocaine, 1 cc bupivacaine injected easily Completed without difficulty  Pain immediately resolved suggesting accurate placement of the medication.  Advised to call if fevers/chills, erythema, induration, drainage, or persistent bleeding.  Images permanently stored and available for review in the ultrasound unit.  Impression: Technically successful ultrasound guided injection.  Procedure: Real-time Ultrasound Guided injection of the left sacroiliac joint Device: Samsung HS60  Verbal informed consent obtained.  Time-out conducted.  Noted no overlying erythema, induration, or other signs of local infection.  Skin prepped in a sterile fashion.  Local anesthesia: Topical Ethyl chloride.  With sterile technique and under real time ultrasound guidance: 1 cc Kenalog 40, 2 cc lidocaine, 2 cc bupivacaine injected easily Completed without difficulty  Pain immediately resolved suggesting accurate placement of the medication.  Advised to call if fevers/chills, erythema, induration, drainage, or persistent bleeding.  Images permanently stored and available for review in the ultrasound unit.  Impression: Technically successful ultrasound guided injection.  Independent interpretation of tests performed by another provider:   None.  Impression and Recommendations:    Lumbar spondylosis with sacroiliac joint dysfunction [name] returns, she is having recurrence of her left-sided low back pain, her last SI joint injection was in October 2020, repeated  today.  Bilateral shoulder bursitis [name] also has right-sided shoulder pain, localized over the anterior deltoid and worse with overhead activities, waking her from sleep. We last did a subacromial injection in May 2020. Now having a recurrence of pain, impingement signs on exam. Subacromial injection today. Return as needed.  Annual physical exam She is also due for a Tdap and pneumonia vaccination, she recently turned 28 so she will need pneumococcal 23. Prescription for Tdap sent to her pharmacy.    ___________________________________________ Gwen Her. Dianah Field, M.D., ABFM., CAQSM. Primary Care and [place] Instructor of [place] of [facility] of Medicine

## 2019-04-08 NOTE — Assessment & Plan Note (Signed)
Tenli also has right-sided shoulder pain, localized over the anterior deltoid and worse with overhead activities, waking her from sleep. We last did a subacromial injection in May 2020. Now having a recurrence of pain, impingement signs on exam. Subacromial injection today. Return as needed.

## 2019-04-08 NOTE — Assessment & Plan Note (Signed)
[name] returns, she is having recurrence of her left-sided low back pain, her last SI joint injection was in October 2020, repeated today.

## 2019-04-08 NOTE — Assessment & Plan Note (Signed)
She is also due for a Tdap and pneumonia vaccination, she recently turned 61 so she will need pneumococcal 23. Prescription for Tdap sent to her pharmacy.

## 2019-04-08 NOTE — Addendum Note (Signed)
Addended by: Silverio Decamp on: 04/08/2019 10:35 AM   Modules accepted: Orders

## 2019-04-14 DIAGNOSIS — R69 Illness, unspecified: Secondary | ICD-10-CM | POA: Diagnosis not present

## 2019-05-20 ENCOUNTER — Other Ambulatory Visit: Payer: Self-pay | Admitting: Sports Medicine

## 2019-05-22 ENCOUNTER — Ambulatory Visit: Payer: Medicare HMO | Attending: Internal Medicine

## 2019-05-22 DIAGNOSIS — Z23 Encounter for immunization: Secondary | ICD-10-CM | POA: Insufficient documentation

## 2019-05-22 NOTE — Progress Notes (Signed)
   Covid-19 Vaccination Clinic  Name:  TSURUE WARGEL    MRN: [id number] DOB: 1951-01-05  05/22/2019  Ms. Barman was observed post Covid-19 immunization for 15 minutes without incident. She was provided with Vaccine Information Sheet and instruction to access the V-Safe system.   Ms. Lehrer was instructed to call 911 with any severe reactions post vaccine: Marland Kitchen Difficulty breathing  . Swelling of face and throat  . A fast heartbeat  . A bad rash all over body  . Dizziness and weakness

## 2019-05-27 DIAGNOSIS — Z124 Encounter for screening for malignant neoplasm of cervix: Secondary | ICD-10-CM | POA: Diagnosis not present

## 2019-05-27 DIAGNOSIS — Z6831 Body mass index (BMI) 31.0-31.9, adult: Secondary | ICD-10-CM | POA: Diagnosis not present

## 2019-06-02 DIAGNOSIS — R69 Illness, unspecified: Secondary | ICD-10-CM | POA: Diagnosis not present

## 2019-06-10 ENCOUNTER — Encounter (HOSPITAL_BASED_OUTPATIENT_CLINIC_OR_DEPARTMENT_OTHER): Payer: Self-pay

## 2019-06-15 ENCOUNTER — Other Ambulatory Visit: Payer: Self-pay | Admitting: Sports Medicine

## 2019-06-15 DIAGNOSIS — I10 Essential (primary) hypertension: Secondary | ICD-10-CM

## 2019-06-18 ENCOUNTER — Ambulatory Visit: Payer: Medicare HMO | Attending: Internal Medicine

## 2019-06-18 DIAGNOSIS — Z23 Encounter for immunization: Secondary | ICD-10-CM

## 2019-06-18 NOTE — Progress Notes (Signed)
   Covid-19 Vaccination Clinic  Name:  ENEYDA BOLINSKI    MRN: [id number] DOB: 11-12-1950  06/18/2019  Ms. Suguitan was observed post Covid-19 immunization for 15 minutes without incident. She was provided with Vaccine Information Sheet and instruction to access the V-Safe system.   Ms. Moos was instructed to call 911 with any severe reactions post vaccine: Marland Kitchen Difficulty breathing  . Swelling of face and throat  . A fast heartbeat  . A bad rash all over body  . Dizziness and weakness   Immunizations Administered    Name Date Dose VIS Date Route   Pfizer COVID-19 Vaccine 06/18/2019  8:24 AM 0.3 mL 02/28/2019 Intramuscular   Manufacturer: [place]   Lot: [id number]   [place]: [id number]

## 2019-06-22 ENCOUNTER — Other Ambulatory Visit: Payer: Self-pay | Admitting: Sports Medicine

## 2019-07-11 ENCOUNTER — Telehealth: Payer: Self-pay

## 2019-07-11 ENCOUNTER — Other Ambulatory Visit: Payer: Self-pay

## 2019-07-11 ENCOUNTER — Ambulatory Visit (INDEPENDENT_AMBULATORY_CARE_PROVIDER_SITE_OTHER): Payer: Medicare HMO | Admitting: Sports Medicine

## 2019-07-11 DIAGNOSIS — M170 Bilateral primary osteoarthritis of knee: Secondary | ICD-10-CM | POA: Diagnosis not present

## 2019-07-11 DIAGNOSIS — M7542 Impingement syndrome of left shoulder: Secondary | ICD-10-CM

## 2019-07-11 DIAGNOSIS — M19012 Primary osteoarthritis, left shoulder: Secondary | ICD-10-CM | POA: Insufficient documentation

## 2019-07-11 MED ORDER — EUFLEXXA 20 MG/2ML IX SOSY: 1.0000 | PREFILLED_SYRINGE | INTRA_ARTICULAR | 3 refills | Status: DC

## 2019-07-11 NOTE — Telephone Encounter (Signed)
Ok, routing to [place] approval please.  Both knees.

## 2019-07-11 NOTE — Assessment & Plan Note (Signed)
This pleasant 69 year old female returns, she has bilateral knee osteoarthritis, we did Euflexxa back in 2019 and she did well, she does not desire steroid injection so we are going to repeat the Euflexxa series. I will be sending them to the [facility] special pharmacy.

## 2019-07-11 NOTE — Telephone Encounter (Signed)
[name] came back in the office to say she was mistaken about the information about the insurance. The last knee injection she had was with a different insurance company. We will need to start the process from the beginning.

## 2019-07-11 NOTE — Assessment & Plan Note (Signed)
[name] has also noted increasing impingement symptoms in her left shoulder, worse with overhead activities and localized over the deltoid. Not waking her from sleep, we injected her right shoulder about 3 months ago when she did well. She agrees to get more diligent with her rehabilitation exercises and we can revisit this in 1 month, if persistent discomfort we will proceed with repeat subacromial injection on the left.

## 2019-07-11 NOTE — Progress Notes (Signed)
    Procedures performed today:    None.  Independent interpretation of notes and tests performed by another provider:   None.  Brief History, Exam, Impression, and Recommendations:    Primary osteoarthritis of both knees This pleasant 69 year old female returns, she has bilateral knee osteoarthritis, we did Euflexxa back in 2019 and she did well, she does not desire steroid injection so we are going to repeat the Euflexxa series. I will be sending them to the [place] special pharmacy.  Impingement syndrome, shoulder, left Neoma Laming has also noted increasing impingement symptoms in her left shoulder, worse with overhead activities and localized over the deltoid. Not waking her from sleep, we injected her right shoulder about 3 months ago when she did well. She agrees to get more diligent with her rehabilitation exercises and we can revisit this in 1 month, if persistent discomfort we will proceed with repeat subacromial injection on the left.    ___________________________________________ Gwen Her. Dianah Field, M.D., ABFM., CAQSM. Primary Care and [place] Instructor of [place] of [facility] of Medicine

## 2019-07-15 ENCOUNTER — Telehealth: Payer: Self-pay | Admitting: Sports Medicine

## 2019-07-15 NOTE — Telephone Encounter (Signed)
Received fax for [place] on Euflexxa sent through cover my meds waiting on determination. - CF

## 2019-07-17 NOTE — Telephone Encounter (Signed)
Started orthovisc and Prior authorization. - CF

## 2019-07-23 DIAGNOSIS — R69 Illness, unspecified: Secondary | ICD-10-CM | POA: Diagnosis not present

## 2019-07-30 ENCOUNTER — Other Ambulatory Visit: Payer: Self-pay | Admitting: Sports Medicine

## 2019-08-05 ENCOUNTER — Ambulatory Visit (INDEPENDENT_AMBULATORY_CARE_PROVIDER_SITE_OTHER): Payer: Medicare HMO | Admitting: Sports Medicine

## 2019-08-05 ENCOUNTER — Other Ambulatory Visit: Payer: Self-pay

## 2019-08-05 DIAGNOSIS — M170 Bilateral primary osteoarthritis of knee: Secondary | ICD-10-CM | POA: Diagnosis not present

## 2019-08-05 NOTE — Assessment & Plan Note (Signed)
[name] returns, she has done well with Euflexxa back in 2019, she has new insurance so this time we are using Orthovisc. We did Orthovisc No. 1 of 4 into both knees, return in 1 week for Orthovisc No. 2 of 4 both knees.

## 2019-08-05 NOTE — Progress Notes (Signed)
    Procedures performed today:    Procedure: Real-time Ultrasound Guided injection of the left knee Device: Samsung HS60  Verbal informed consent obtained.  Time-out conducted.  Noted no overlying erythema, induration, or other signs of local infection.  Skin prepped in a sterile fashion.  Local anesthesia: Topical Ethyl chloride.  With sterile technique and under real time ultrasound guidance:  30 mg/2 mL of OrthoVisc (sodium hyaluronate) in a prefilled syringe was injected easily into the knee through a 22-gauge needle.   Completed without difficulty  Pain immediately resolved suggesting accurate placement of the medication.  Advised to call if fevers/chills, erythema, induration, drainage, or persistent bleeding.  Images permanently stored and available for review in the ultrasound unit.  Impression: Technically successful ultrasound guided injection.  Procedure: Real-time Ultrasound Guided injection of the right knee Device: Samsung HS60  Verbal informed consent obtained.  Time-out conducted.  Noted no overlying erythema, induration, or other signs of local infection.  Skin prepped in a sterile fashion.  Local anesthesia: Topical Ethyl chloride.  With sterile technique and under real time ultrasound guidance:  30 mg/2 mL of OrthoVisc (sodium hyaluronate) in a prefilled syringe was injected easily into the knee through a 22-gauge needle.   Completed without difficulty  Pain immediately resolved suggesting accurate placement of the medication.  Advised to call if fevers/chills, erythema, induration, drainage, or persistent bleeding.  Images permanently stored and available for review in the ultrasound unit.  Impression: Technically successful ultrasound guided injection.  Independent interpretation of notes and tests performed by another provider:   None.  Brief History, Exam, Impression, and Recommendations:    No problem-specific Assessment & Plan notes found for this  encounter.    ___________________________________________ Gwen Her. Dianah Field, M.D., ABFM., CAQSM. Primary Care and [place] Instructor of [place] of [facility] of Medicine

## 2019-08-12 ENCOUNTER — Other Ambulatory Visit: Payer: Self-pay

## 2019-08-12 ENCOUNTER — Ambulatory Visit (INDEPENDENT_AMBULATORY_CARE_PROVIDER_SITE_OTHER): Payer: Medicare HMO | Admitting: Sports Medicine

## 2019-08-12 DIAGNOSIS — M170 Bilateral primary osteoarthritis of knee: Secondary | ICD-10-CM | POA: Diagnosis not present

## 2019-08-12 NOTE — Assessment & Plan Note (Addendum)
Orthovisc No. 2 of 4 both knees, return in 1 week for #3 of 4.  Orthovisc is from the patient's specialty pharmacy so do not bill for Orthovisc.

## 2019-08-12 NOTE — Progress Notes (Signed)
    Procedures performed today:    Procedure: Real-time Ultrasound Guidedinjection of the left knee Device: Samsung HS60  Verbal informed consent obtained.  Time-out conducted.  Noted no overlying erythema, induration, or other signs of local infection.  Skin prepped in a sterile fashion.  Local anesthesia: Topical Ethyl chloride.  With sterile technique and under real time ultrasound guidance: 30 mg/2 mL of OrthoVisc (sodium hyaluronate) in a prefilled syringe was injected easily into the knee through a 22-gauge needle.   Completed without difficulty  Pain immediately resolved suggesting accurate placement of the medication.  Advised to call if fevers/chills, erythema, induration, drainage, or persistent bleeding.  Images permanently stored and available for review in the ultrasound unit.  Impression: Technically successful ultrasound guided injection.  Procedure: Real-time Ultrasound Guidedinjection of the right knee Device: Samsung HS60  Verbal informed consent obtained.  Time-out conducted.  Noted no overlying erythema, induration, or other signs of local infection.  Skin prepped in a sterile fashion.  Local anesthesia: Topical Ethyl chloride.  With sterile technique and under real time ultrasound guidance: 30 mg/2 mL of OrthoVisc (sodium hyaluronate) in a prefilled syringe was injected easily into the knee through a 22-gauge needle.   Completed without difficulty  Pain immediately resolved suggesting accurate placement of the medication.  Advised to call if fevers/chills, erythema, induration, drainage, or persistent bleeding.  Images permanently stored and available for review in the ultrasound unit.  Impression: Technically successful ultrasound guided injection.  Independent interpretation of notes and tests performed by another provider:   None.  Brief History, Exam, Impression, and Recommendations:    Primary osteoarthritis of both knees Orthovisc No. 2 of 4 both  knees, return in 1 week for #3 of 4.  Orthovisc is from the patient's specialty pharmacy so do not bill for Orthovisc.    ___________________________________________ Gwen Her. Dianah Field, M.D., ABFM., CAQSM. Primary Care and [place] Instructor of [place] of [facility] of Medicine

## 2019-08-19 ENCOUNTER — Ambulatory Visit: Payer: Medicare HMO | Admitting: Sports Medicine

## 2019-08-19 NOTE — Telephone Encounter (Signed)
Orthovisc was approved patient is already scheduled for injections. - CF

## 2019-08-20 ENCOUNTER — Ambulatory Visit (INDEPENDENT_AMBULATORY_CARE_PROVIDER_SITE_OTHER): Payer: Medicare HMO | Admitting: Sports Medicine

## 2019-08-20 DIAGNOSIS — M170 Bilateral primary osteoarthritis of knee: Secondary | ICD-10-CM | POA: Diagnosis not present

## 2019-08-20 NOTE — Assessment & Plan Note (Signed)
Orthovisc No. 3 of 4 into both knees, return in 1 week for #4 of 4, we will likely do the shoulder at that time as well.

## 2019-08-20 NOTE — Progress Notes (Signed)
    Procedures performed today:    Procedure: Real-time Ultrasound Guidedinjection of theleft knee Device: Samsung HS60  Verbal informed consent obtained.  Time-out conducted.  Noted no overlying erythema, induration, or other signs of local infection.  Skin prepped in a sterile fashion.  Local anesthesia: Topical Ethyl chloride.  With sterile technique and under real time ultrasound guidance:30 mg/2 mL of OrthoVisc (sodium hyaluronate) in a prefilled syringe was injected easily into the knee through a 22-gauge needle.  Completed without difficulty  Pain immediately resolved suggesting accurate placement of the medication.  Advised to call if fevers/chills, erythema, induration, drainage, or persistent bleeding.  Images permanently stored and available for review in the ultrasound unit.  Impression: Technically successful ultrasound guided injection.  Procedure: Real-time Ultrasound Guidedinjection of therightknee Device: Samsung HS60  Verbal informed consent obtained.  Time-out conducted.  Noted no overlying erythema, induration, or other signs of local infection.  Skin prepped in a sterile fashion.  Local anesthesia: Topical Ethyl chloride.  With sterile technique and under real time ultrasound guidance:30 mg/2 mL of OrthoVisc (sodium hyaluronate) in a prefilled syringe was injected easily into the knee through a 22-gauge needle.  Completed without difficulty  Pain immediately resolved suggesting accurate placement of the medication.  Advised to call if fevers/chills, erythema, induration, drainage, or persistent bleeding.  Images permanently stored and available for review in the ultrasound unit.  Impression: Technically successful ultrasound guided injection.  Independent interpretation of notes and tests performed by another provider:   None.  Brief History, Exam, Impression, and Recommendations:    Primary osteoarthritis of both knees Orthovisc No. 3 of 4 into  both knees, return in 1 week for #4 of 4, we will likely do the shoulder at that time as well.    ___________________________________________ Gwen Her. Dianah Field, M.D., ABFM., CAQSM. Primary Care and [place] Instructor of [place] of [facility] of Medicine

## 2019-08-21 ENCOUNTER — Other Ambulatory Visit: Payer: Self-pay | Admitting: Sports Medicine

## 2019-08-21 DIAGNOSIS — I10 Essential (primary) hypertension: Secondary | ICD-10-CM

## 2019-08-26 ENCOUNTER — Ambulatory Visit (INDEPENDENT_AMBULATORY_CARE_PROVIDER_SITE_OTHER): Payer: Medicare HMO | Admitting: Sports Medicine

## 2019-08-26 DIAGNOSIS — M7542 Impingement syndrome of left shoulder: Secondary | ICD-10-CM | POA: Diagnosis not present

## 2019-08-26 DIAGNOSIS — M170 Bilateral primary osteoarthritis of knee: Secondary | ICD-10-CM

## 2019-08-26 NOTE — Progress Notes (Signed)
    Procedures performed today:    Procedure: Real-time Ultrasound Guided injection of the right subacromial bursa Device: Samsung HS60  Verbal informed consent obtained.  Time-out conducted.  Noted no overlying erythema, induration, or other signs of local infection.  Skin prepped in a sterile fashion.  Local anesthesia: Topical Ethyl chloride.  With sterile technique and under real time ultrasound guidance: 1 cc Kenalog 40, 2 cc lidocaine, 2 cc bupivacaine injected easily Completed without difficulty  Pain immediately resolved suggesting accurate placement of the medication.  Advised to call if fevers/chills, erythema, induration, drainage, or persistent bleeding.  Images permanently stored and available for review in the ultrasound unit.  Impression: Technically successful ultrasound guided injection.  Procedure: Real-time Ultrasound Guided injection of the left knee Device: Samsung HS60  Verbal informed consent obtained.  Time-out conducted.  Noted no overlying erythema, induration, or other signs of local infection.  Skin prepped in a sterile fashion.  Local anesthesia: Topical Ethyl chloride.  With sterile technique and under real time ultrasound guidance:  30 mg/2 mL of OrthoVisc (sodium hyaluronate) in a prefilled syringe was injected easily into the knee through a 22-gauge needle.   Completed without difficulty  Pain immediately resolved suggesting accurate placement of the medication.  Advised to call if fevers/chills, erythema, induration, drainage, or persistent bleeding.  Images permanently stored and available for review in the ultrasound unit.  Impression: Technically successful ultrasound guided injection.  Procedure: Real-time Ultrasound Guided injection of the right knee Device: Samsung HS60  Verbal informed consent obtained.  Time-out conducted.  Noted no overlying erythema, induration, or other signs of local infection.  Skin prepped in a sterile fashion.    Local anesthesia: Topical Ethyl chloride.  With sterile technique and under real time ultrasound guidance:  30 mg/2 mL of OrthoVisc (sodium hyaluronate) in a prefilled syringe was injected easily into the knee through a 22-gauge needle.   Completed without difficulty  Pain immediately resolved suggesting accurate placement of the medication.  Advised to call if fevers/chills, erythema, induration, drainage, or persistent bleeding.  Images permanently stored and available for review in the ultrasound unit.  Impression: Technically successful ultrasound guided injection.  Independent interpretation of notes and tests performed by another provider:   None.  Brief History, Exam, Impression, and Recommendations:    Primary osteoarthritis of both knees Orthovisc No. 4 of 4 into both knees, doing well.  Impingement syndrome, shoulder, left Persistent impingement symptoms, right subacromial injection today, she did have a fairly distended bursa, return to see me as needed for this.    ___________________________________________ Gwen Her. Dianah Field, M.D., ABFM., CAQSM. Primary Care and [place] Instructor of [place] of [facility] of Medicine

## 2019-08-26 NOTE — Assessment & Plan Note (Signed)
Persistent impingement symptoms, right subacromial injection today, she did have a fairly distended bursa, return to see me as needed for this.

## 2019-08-26 NOTE — Assessment & Plan Note (Signed)
Orthovisc No. 4 of 4 into both knees, doing well.

## 2019-09-01 MED ORDER — EPLERENONE 25 MG PO TABS: 25.0000 mg | ORAL_TABLET | Freq: Every day | ORAL | 3 refills | Status: DC

## 2019-09-06 ENCOUNTER — Other Ambulatory Visit: Payer: Self-pay | Admitting: Sports Medicine

## 2019-09-20 ENCOUNTER — Other Ambulatory Visit: Payer: Self-pay | Admitting: Sports Medicine

## 2019-10-01 ENCOUNTER — Other Ambulatory Visit: Payer: Self-pay | Admitting: Obstetrics & Gynecology

## 2019-10-01 DIAGNOSIS — Z1231 Encounter for screening mammogram for malignant neoplasm of breast: Secondary | ICD-10-CM

## 2019-10-08 ENCOUNTER — Other Ambulatory Visit: Payer: Self-pay

## 2019-10-08 ENCOUNTER — Ambulatory Visit (INDEPENDENT_AMBULATORY_CARE_PROVIDER_SITE_OTHER): Payer: Medicare HMO

## 2019-10-08 DIAGNOSIS — Z1231 Encounter for screening mammogram for malignant neoplasm of breast: Secondary | ICD-10-CM

## 2019-10-10 ENCOUNTER — Other Ambulatory Visit: Payer: Self-pay | Admitting: Obstetrics & Gynecology

## 2019-10-10 DIAGNOSIS — R928 Other abnormal and inconclusive findings on diagnostic imaging of breast: Secondary | ICD-10-CM

## 2019-10-13 DIAGNOSIS — R69 Illness, unspecified: Secondary | ICD-10-CM | POA: Diagnosis not present

## 2019-10-20 DIAGNOSIS — L853 Xerosis cutis: Secondary | ICD-10-CM

## 2019-10-21 MED ORDER — TRIAMCINOLONE 0.1 % CREAM:EUCERIN CREAM 1:1: 1.0000 "application " | TOPICAL_CREAM | Freq: Two times a day (BID) | CUTANEOUS | 11 refills | Status: AC

## 2019-10-22 ENCOUNTER — Other Ambulatory Visit: Payer: Self-pay

## 2019-10-22 ENCOUNTER — Ambulatory Visit
Admission: RE | Admit: 2019-10-22 | Discharge: 2019-10-22 | Disposition: A | Discharge status: Home / Self Care | Payer: Medicare HMO | Source: Ambulatory Visit | Attending: Obstetrics & Gynecology | Admitting: Obstetrics & Gynecology

## 2019-10-22 ENCOUNTER — Ambulatory Visit: Payer: Medicare HMO

## 2019-10-22 DIAGNOSIS — R922 Inconclusive mammogram: Secondary | ICD-10-CM | POA: Diagnosis not present

## 2019-10-22 DIAGNOSIS — R928 Other abnormal and inconclusive findings on diagnostic imaging of breast: Secondary | ICD-10-CM

## 2019-10-23 ENCOUNTER — Telehealth: Payer: Self-pay

## 2019-10-23 NOTE — Telephone Encounter (Signed)
Pharmacist from [employer] called stating she needs to know the what is the ratio and quantity for triamcinolone/eucerin cream? Pls advise, thanks.

## 2019-10-24 NOTE — Telephone Encounter (Signed)
Pharmacist advised

## 2019-10-24 NOTE — Telephone Encounter (Signed)
Ratio is 1:1, quantity is 1 jar so about 454 g.

## 2019-11-04 ENCOUNTER — Other Ambulatory Visit: Payer: Self-pay | Admitting: Sports Medicine

## 2019-11-11 ENCOUNTER — Ambulatory Visit (INDEPENDENT_AMBULATORY_CARE_PROVIDER_SITE_OTHER): Payer: Medicare HMO

## 2019-11-11 ENCOUNTER — Ambulatory Visit (INDEPENDENT_AMBULATORY_CARE_PROVIDER_SITE_OTHER): Payer: Medicare HMO | Admitting: Sports Medicine

## 2019-11-11 ENCOUNTER — Other Ambulatory Visit: Payer: Self-pay

## 2019-11-11 DIAGNOSIS — M7551 Bursitis of right shoulder: Secondary | ICD-10-CM

## 2019-11-11 DIAGNOSIS — M47816 Spondylosis without myelopathy or radiculopathy, lumbar region: Secondary | ICD-10-CM

## 2019-11-11 DIAGNOSIS — M19011 Primary osteoarthritis, right shoulder: Secondary | ICD-10-CM

## 2019-11-11 DIAGNOSIS — M7552 Bursitis of left shoulder: Secondary | ICD-10-CM

## 2019-11-11 NOTE — Assessment & Plan Note (Signed)
[name] returns, she is a very pleasant 68 year old female, last SI joint injection was in January of this year, having a recurrence of left-sided low back pain referable to the SI joint, repeat left SI joint injection today.

## 2019-11-11 NOTE — Assessment & Plan Note (Signed)
[name] has known primary osteoarthritis of the right shoulder with near bone-on-bone findings on x-ray. She has had steroid injections, minimal response, we do have some samples of Synvisc, I would like to bring her back at her leisure to do a 3 injection Synvisc series in her right glenohumeral joint. It is in the viscosupplementation to work with her name on it. She will also consider shoulder arthroplasty with either Dr. Griffin Basil or Dr. Tamera Punt.

## 2019-11-11 NOTE — Progress Notes (Signed)
    Procedures performed today:    Procedure: Real-time Ultrasound Guided injection of the left sacroiliac joint Device: Samsung HS60  Verbal informed consent obtained.  Time-out conducted.  Noted no overlying erythema, induration, or other signs of local infection.  Skin prepped in a sterile fashion.  Local anesthesia: Topical Ethyl chloride.  With sterile technique and under real time ultrasound guidance: 1 cc Kenalog 40, 2 cc lidocaine, 2 cc bupivacaine injected easily Completed without difficulty  Pain immediately resolved suggesting accurate placement of the medication.  Advised to call if fevers/chills, erythema, induration, drainage, or persistent bleeding.  Images permanently stored and available for review in the ultrasound unit.  Impression: Technically successful ultrasound guided injection.  Independent interpretation of notes and tests performed by another provider:   None.  Brief History, Exam, Impression, and Recommendations:    Lumbar spondylosis with sacroiliac joint dysfunction [name] returns, she is a very pleasant 69 year old female, last SI joint injection was in January of this year, having a recurrence of left-sided low back pain referable to the SI joint, repeat left SI joint injection today.  Primary osteoarthritis of right shoulder [name] has known primary osteoarthritis of the right shoulder with near bone-on-bone findings on x-ray. She has had steroid injections, minimal response, we do have some samples of Synvisc, I would like to bring her back at her leisure to do a 3 injection Synvisc series in her right glenohumeral joint. It is in the viscosupplementation to work with her name on it. She will also consider shoulder arthroplasty with either Dr. Griffin Basil or Dr. Tamera Punt.    ___________________________________________ Gwen Her. Dianah Field, M.D., ABFM., CAQSM. Primary Care and [name]  Instructor of [place] of [facility] of Medicine

## 2019-11-12 MED ORDER — HYDROCODONE-ACETAMINOPHEN 5-325 MG PO TABS: 1.0000 | ORAL_TABLET | Freq: Three times a day (TID) | ORAL | 0 refills | Status: DC | PRN

## 2019-12-05 ENCOUNTER — Other Ambulatory Visit: Payer: Self-pay | Admitting: Sports Medicine

## 2019-12-05 DIAGNOSIS — I10 Essential (primary) hypertension: Secondary | ICD-10-CM

## 2019-12-19 ENCOUNTER — Other Ambulatory Visit: Payer: Self-pay | Admitting: Sports Medicine

## 2019-12-29 ENCOUNTER — Ambulatory Visit (INDEPENDENT_AMBULATORY_CARE_PROVIDER_SITE_OTHER): Payer: Medicare HMO

## 2019-12-29 ENCOUNTER — Ambulatory Visit (INDEPENDENT_AMBULATORY_CARE_PROVIDER_SITE_OTHER): Payer: Medicare HMO | Admitting: Sports Medicine

## 2019-12-29 DIAGNOSIS — M19011 Primary osteoarthritis, right shoulder: Secondary | ICD-10-CM | POA: Diagnosis not present

## 2019-12-29 NOTE — Progress Notes (Signed)
    Procedures performed today:    Procedure: Real-time Ultrasound Guided injection of the right glenohumeral joint Device: Samsung HS60  Verbal informed consent obtained.  Time-out conducted.  Noted no overlying erythema, induration, or other signs of local infection.  Skin prepped in a sterile fashion.  Local anesthesia: Topical Ethyl chloride.  With sterile technique and under real time ultrasound guidance:  1 syringe of Synvisc injected easily into the joint followed by 2 mL of lidocaine through a 22-gauge spinal needle.   Completed without difficulty  Pain immediately resolved suggesting accurate placement of the medication.  Advised to call if fevers/chills, erythema, induration, drainage, or persistent bleeding.  Images permanently stored and available for review in PACS.  Impression: Technically successful ultrasound guided injection.  Independent interpretation of notes and tests performed by another provider:   None.  Brief History, Exam, Impression, and Recommendations:    Primary osteoarthritis of right shoulder [name] has known primary osteoarthritis of her right shoulder with near bone-on-bone findings on x-rays, she has had steroid injections without much of response, we did have some Synvisc samples, today we did a Synvisc injection #1 of 3 into her right glenohumeral joint chased with a couple of milliliters of lidocaine. She will return in 1 week for shot #2 of 3. She would also consider shoulder arthroplasty if failure of the above.    ___________________________________________ Gwen Her. Dianah Field, M.D., ABFM., CAQSM. Primary Care and [place] Instructor of [place] of [facility] of Medicine

## 2019-12-29 NOTE — Assessment & Plan Note (Signed)
[name] has known primary osteoarthritis of her right shoulder with near bone-on-bone findings on x-rays, she has had steroid injections without much of response, we did have some Synvisc samples, today we did a Synvisc injection #1 of 3 into her right glenohumeral joint chased with a couple of milliliters of lidocaine. She will return in 1 week for shot #2 of 3. She would also consider shoulder arthroplasty if failure of the above.

## 2020-01-05 ENCOUNTER — Other Ambulatory Visit: Payer: Self-pay

## 2020-01-05 ENCOUNTER — Ambulatory Visit (INDEPENDENT_AMBULATORY_CARE_PROVIDER_SITE_OTHER): Payer: Medicare HMO | Admitting: Sports Medicine

## 2020-01-05 ENCOUNTER — Ambulatory Visit (INDEPENDENT_AMBULATORY_CARE_PROVIDER_SITE_OTHER): Payer: Medicare HMO

## 2020-01-05 DIAGNOSIS — M19011 Primary osteoarthritis, right shoulder: Secondary | ICD-10-CM

## 2020-01-05 NOTE — Assessment & Plan Note (Signed)
Known primary osteoarthritis of the right shoulder with near bone-on-bone findings on x-rays, has had steroid injections without much of a response, we did have some Synvisc samples, today we did Synvisc No. 2 of 3 into the glenohumeral joint. Return in 1 week for #3 of 3, shoulder arthroplasty if failure.

## 2020-01-05 NOTE — Progress Notes (Signed)
    Procedures performed today:    Procedure: Real-time Ultrasound Guidedinjection of the right glenohumeral joint Device: Samsung HS60  Verbal informed consent obtained.  Time-out conducted.  Noted no overlying erythema, induration, or other signs of local infection.  Skin prepped in a sterile fashion.  Local anesthesia: Topical Ethyl chloride.  With sterile technique and under real time ultrasound guidance: 1 syringe of Synvisc injected easily into the joint followed by 2 mL of lidocaine through a 22-gauge spinal needle.   Completed without difficulty  Pain immediately resolved suggesting accurate placement of the medication.  Advised to call if fevers/chills, erythema, induration, drainage, or persistent bleeding.  Images permanently stored and available for review in PACS.  Impression: Technically successful ultrasound guided injection.  Independent interpretation of notes and tests performed by another provider:   None.  Brief History, Exam, Impression, and Recommendations:    Primary osteoarthritis of right shoulder Known primary osteoarthritis of the right shoulder with near bone-on-bone findings on x-rays, has had steroid injections without much of a response, we did have some Synvisc samples, today we did Synvisc No. 2 of 3 into the glenohumeral joint. Return in 1 week for #3 of 3, shoulder arthroplasty if failure.    ___________________________________________ Gwen Her. Dianah Field, M.D., ABFM., CAQSM. Primary Care and [place] Instructor of [place] of [facility] of Medicine

## 2020-01-12 ENCOUNTER — Ambulatory Visit (INDEPENDENT_AMBULATORY_CARE_PROVIDER_SITE_OTHER): Payer: Medicare HMO

## 2020-01-12 ENCOUNTER — Ambulatory Visit (INDEPENDENT_AMBULATORY_CARE_PROVIDER_SITE_OTHER): Payer: Medicare HMO | Admitting: Sports Medicine

## 2020-01-12 DIAGNOSIS — M19011 Primary osteoarthritis, right shoulder: Secondary | ICD-10-CM

## 2020-01-12 NOTE — Progress Notes (Signed)
    Procedures performed today:    Procedure: Real-time Ultrasound Guidedinjection of theright glenohumeral joint Device: Samsung HS60  Verbal informed consent obtained.  Time-out conducted.  Noted no overlying erythema, induration, or other signs of local infection.  Skin prepped in a sterile fashion.  Local anesthesia: Topical Ethyl chloride.  With sterile technique and under real time ultrasound guidance:1 syringe of Synvisc injected easily into the joint followed by 2 mL of lidocaine through a 22-gauge spinal needle.  Completed without difficulty  Pain immediately resolved suggesting accurate placement of the medication.  Advised to call if fevers/chills, erythema, induration, drainage, or persistent bleeding.  Images permanently stored and available for review in PACS.  Impression: Technically successful ultrasound guided injection.  Independent interpretation of notes and tests performed by another provider:   None.  Brief History, Exam, Impression, and Recommendations:    Primary osteoarthritis of right shoulder Known primary osteoarthritis of the right shoulder, near bone-on-bone findings on x-ray, steroid injection did not provide good relief, we have been using Cooper Synvisc samples, today we will do Synvisc No. 3 of 3 into the glenohumeral joint, I do not have high hopes for this considering Cooper near bone-on-bone state so I would like Dr. Griffin Basil to go ahead and weigh in and discuss arthroplasty.    ___________________________________________ [name]. [name], M.D., ABFM., CAQSM. Primary Care and [place] Instructor of [place] of [facility] of Medicine

## 2020-01-12 NOTE — Assessment & Plan Note (Addendum)
Known primary osteoarthritis of the right shoulder, near bone-on-bone findings on x-ray, steroid injection did not provide good relief, we have been using her Synvisc samples, today we will do Synvisc No. 3 of 3 into the glenohumeral joint, I do not have high hopes for this considering her near bone-on-bone state so I would like Dr. Griffin Basil to go ahead and weigh in and discuss arthroplasty.

## 2020-01-13 DIAGNOSIS — D2272 Melanocytic nevi of left lower limb, including hip: Secondary | ICD-10-CM | POA: Diagnosis not present

## 2020-01-13 DIAGNOSIS — L821 Other seborrheic keratosis: Secondary | ICD-10-CM | POA: Diagnosis not present

## 2020-01-13 DIAGNOSIS — D485 Neoplasm of uncertain behavior of skin: Secondary | ICD-10-CM | POA: Diagnosis not present

## 2020-01-13 DIAGNOSIS — D692 Other nonthrombocytopenic purpura: Secondary | ICD-10-CM | POA: Diagnosis not present

## 2020-01-13 DIAGNOSIS — L820 Inflamed seborrheic keratosis: Secondary | ICD-10-CM | POA: Diagnosis not present

## 2020-01-13 DIAGNOSIS — D2239 Melanocytic nevi of other parts of face: Secondary | ICD-10-CM | POA: Diagnosis not present

## 2020-01-13 DIAGNOSIS — D1801 Hemangioma of skin and subcutaneous tissue: Secondary | ICD-10-CM | POA: Diagnosis not present

## 2020-01-15 ENCOUNTER — Other Ambulatory Visit: Payer: Self-pay | Admitting: Orthopaedic Surgery

## 2020-01-15 DIAGNOSIS — M25511 Pain in right shoulder: Secondary | ICD-10-CM

## 2020-01-21 ENCOUNTER — Encounter: Payer: Self-pay | Admitting: Sports Medicine

## 2020-01-21 ENCOUNTER — Ambulatory Visit (INDEPENDENT_AMBULATORY_CARE_PROVIDER_SITE_OTHER): Payer: Medicare HMO | Admitting: Sports Medicine

## 2020-01-21 VITALS — BP 144/84 | HR 67 | Ht 63.0 in | Wt 173.0 lb

## 2020-01-21 DIAGNOSIS — I10 Essential (primary) hypertension: Secondary | ICD-10-CM

## 2020-01-21 DIAGNOSIS — M19011 Primary osteoarthritis, right shoulder: Secondary | ICD-10-CM

## 2020-01-21 DIAGNOSIS — Z01818 Encounter for other preprocedural examination: Secondary | ICD-10-CM

## 2020-01-21 MED ORDER — METOPROLOL SUCCINATE ER 25 MG PO TB24: 25.0000 mg | ORAL_TABLET | Freq: Every day | ORAL | 3 refills | Status: DC

## 2020-01-21 NOTE — Assessment & Plan Note (Addendum)
[name] does have some high blood pressure today, she is on spironolactone alone. We gave her some time, and it only came down a bit. She is already doing a relatively low-sodium diet. Goal is less than 140/90, due to her perioperative state we are going to add Toprol-XL 25 mg, we will do a nurse visit blood pressure check in a week. Postoperatively we can consider discontinuing this and switching to amlodipine.

## 2020-01-21 NOTE — Assessment & Plan Note (Signed)
Persistent pain, did not respond to multiple injections of steroid, viscosupplementation, Dr. Griffin Basil is going to proceed with shoulder arthroplasty.

## 2020-01-21 NOTE — Assessment & Plan Note (Addendum)
[name] is going to be having a shoulder arthroplasty, this requires four metabolic equivalents of cardiac capacity as it is intermediate risk noncardiac surgery. She has good cardiac capacity, ECG looks good, blood pressure is a bit high.

## 2020-01-21 NOTE — Progress Notes (Signed)
    Procedures performed today:    Twelve-lead ECG performed and reviewed by me, normal sinus rhythm, no ST changes, PR change, normal axis.  Independent interpretation of notes and tests performed by another provider:   None.  Brief History, Exam, Impression, and Recommendations:    Preop examination [name] is going to be having a shoulder arthroplasty, this requires four metabolic equivalents of cardiac capacity as it is intermediate risk noncardiac surgery. She has good cardiac capacity, ECG looks good, blood pressure is a bit high.   Primary osteoarthritis of right shoulder Persistent pain, did not respond to multiple injections of steroid, viscosupplementation, Dr. Griffin Basil is going to proceed with shoulder arthroplasty.  Essential hypertension, benign [name] does have some high blood pressure today, she is on spironolactone alone. We gave her some time, and it only came down a bit. She is already doing a relatively low-sodium diet. Goal is less than 140/90, due to her perioperative state we are going to add Toprol-XL 25 mg, we will do a nurse visit blood pressure check in a week. Postoperatively we can consider discontinuing this and switching to amlodipine.    ___________________________________________ Gwen Her. Dianah Field, M.D., ABFM., CAQSM. Primary Care and [place] Instructor of [place] of [facility] of Medicine

## 2020-01-28 ENCOUNTER — Ambulatory Visit (INDEPENDENT_AMBULATORY_CARE_PROVIDER_SITE_OTHER): Payer: Medicare HMO | Admitting: Sports Medicine

## 2020-01-28 VITALS — BP 117/62 | HR 65

## 2020-01-28 DIAGNOSIS — I10 Essential (primary) hypertension: Secondary | ICD-10-CM

## 2020-01-28 NOTE — Progress Notes (Signed)
Established Patient Office Visit  Subjective:  Patient ID: [name], female    DOB: [birth year]  Age: 69 y.o. MRN: [id number]  CC:  Chief Complaint  Patient presents with  . Hypertension    HPI [name] presents for blood pressure check. Denies chest pain, shortness of breath or dizziness.   Past Medical History:  Diagnosis Date  . Abnormal vaginal Pap smear    monitored annually by Dr. Dory Horn  . Colon polyps    multple right colon polyps  . Degenerative joint disease   . GERD (gastroesophageal reflux disease)   . Hyperlipidemia    NMR: LDL 120 (1267/584), HDL 59, TG 60. LDL goall<130, ideally <100  . Hypertension   . Hypokalemia    on HCTZ/Triam  . PONV (postoperative nausea and vomiting)   . Vitamin D deficiency    2000 IU    Past Surgical History:  Procedure Laterality Date  . ABDOMINAL HYSTERECTOMY    . APPENDECTOMY    . BREAST BIOPSY    . BREAST SURGERY     left breast biopsy  . BUNIONECTOMY  03/2009  . CHOLECYSTECTOMY  09/2007   Lap, Dr.Gerkin   . COLONOSCOPY  2008   virtual colonoscopy  . DILATION AND CURETTAGE OF UTERUS    . ENDOMETRIAL BIOPSY  04-28-13   Dr Nori Riis  . ENDOSCOPIC CONCHA BULLOSA RESECTION Left 03/24/2013   Procedure: ENDOSCOPIC CONCHA BULLOSA RESECTION;  Surgeon: Izora Gala, MD;  Location: [place];  Service: ENT;  Laterality: Left;  . ETHMOIDECTOMY Left 03/24/2013   Procedure: ETHMOIDECTOMY;  Surgeon: Izora Gala, MD;  Location: [place];  Service: ENT;  Laterality: Left;  . HAMMER TOE SURGERY    . HYSTEROSCOPY     w/ D and C in 09/2005,02/2007,03/2008 (+ poly removal); IDU insertion 09/2008, Dr.Neal  . NEURECTOMY FOOT  03/2008   left  . NEUROMA SURGERY     left foot ,Dr.Sikora  . PARTIAL COLECTOMY Right 03/30/2016   Procedure: RIGHT COLECTOMY;  Surgeon: Armandina Gemma, MD;  Location: [place];  Service: General;  Laterality: Right;  . TOE SURGERY     for bone spur  . TUBAL LIGATION      Family  History  Problem Relation Age of Onset  . Heart attack Father 61       CABG 4-vessel  . Diabetes Father        late onset  . Lung cancer Mother        former smoker  . Diabetes Mother        late onset  . Breast cancer Mother   . Obesity Sister   . Stroke Paternal Grandfather 34  . Lung cancer Maternal Grandfather        former smoker  . Breast cancer Maternal Aunt   . Heart attack Paternal Grandmother 34  . Diabetes type I Unknown        grand daughter    Social History   Socioeconomic History  . Marital status: Married    Spouse name: Not on file  . Number of children: Not on file  . Years of education: Not on file  . Highest education level: Not on file  Occupational History  . Occupation: Marine scientist, [employer]   Tobacco Use  . Smoking status: Never Smoker  . Smokeless tobacco: Never Used  Substance and Sexual Activity  . Alcohol use: Yes    Comment: occasional  . Drug use: No  .  Sexual activity: Not on file  Other Topics Concern  . Not on file  Social History Narrative   Heart Healthy Diet         Social Determinants of Health   Financial Resource Strain:   . Difficulty of Paying Living Expenses: Not on file  Food Insecurity:   . Worried About Charity fundraiser in the Last Year: Not on file  . Ran Out of Food in the Last Year: Not on file  Transportation Needs:   . Lack of Transportation (Medical): Not on file  . Lack of Transportation (Non-Medical): Not on file  Physical Activity:   . Days of Exercise per Week: Not on file  . Minutes of Exercise per Session: Not on file  Stress:   . Feeling of Stress : Not on file  Social Connections:   . Frequency of Communication with Friends and Family: Not on file  . Frequency of Social Gatherings with Friends and Family: Not on file  . Attends Religious Services: Not on file  . Active Member of Clubs or Organizations: Not on file  . Attends Archivist Meetings: Not on file  . Marital Status: Not on  file  Intimate Partner Violence:   . Fear of Current or Ex-Partner: Not on file  . Emotionally Abused: Not on file  . Physically Abused: Not on file  . Sexually Abused: Not on file    Outpatient Medications Prior to Visit  Medication Sig Dispense Refill  . calcium carbonate (OS-CAL) 600 MG tablet Take 600 mg by mouth 2 (two) times daily with a meal.    . Cholecalciferol (VITAMIN D3) 1000 UNITS tablet Take 1,000 Units by mouth daily.      . clotrimazole-betamethasone (LOTRISONE) cream Apply 1 application topically 2 (two) times daily. 45 g 0  . estradiol (MINIVELLE) 0.1 MG/24HR Place 1 patch onto the skin 2 (two) times a week.    . famotidine (PEPCID AC) 10 MG chewable tablet Chew 10 mg by mouth as needed.      Marland Kitchen HYDROcodone-acetaminophen (NORCO/VICODIN) 5-325 MG tablet Take 1 tablet by mouth every 8 (eight) hours as needed for moderate pain. 15 tablet 0  . meloxicam (MOBIC) 15 MG tablet TAKE 1 TABLET DAILY 90 tablet 1  . metoprolol succinate (TOPROL-XL) 25 MG 24 hr tablet Take 1 tablet (25 mg total) by mouth daily. 30 tablet 3  . Omega-3 Fatty Acids (FISH OIL) 1200 MG CAPS Take 1 capsule by mouth daily.     . pravastatin (PRAVACHOL) 20 MG tablet TAKE 1 TABLET DAILY 90 tablet 1  . Sodium Hyaluronate (EUFLEXXA) 20 MG/2ML SOSY Inject 1 Dose into the articular space once a week. x3 weeks.  Dx bilateral knee osteoarthritis 12 mL 3  . spironolactone (ALDACTONE) 25 MG tablet Take 1 tablet (25 mg total) by mouth 2 (two) times daily.    Marland Kitchen tiZANidine (ZANAFLEX) 4 MG tablet TAKE 1 TABLET EVERY 6 HOURSAS NEEDED FOR MUSCLE SPASMS 180 tablet 0  . Triamcinolone Acetonide (TRIAMCINOLONE 0.1 % CREAM : EUCERIN) CREA Apply 1 application topically 2 (two) times daily. 1 each 11  . zolpidem (AMBIEN) 5 MG tablet TAKE ONE TABLET BY MOUTH EVERY 3RD NIGHT PRN AT BETIME (Patient taking differently: Take 5 mg by mouth at bedtime as needed. ) 10 tablet 0   No facility-administered medications prior to visit.     Allergies  Allergen Reactions  . Hydrochlorothiazide W-Triamterene Other (See Comments)    hypokalemia  . Povidone-Iodine Itching  Intolerance, if exposed over a long period.  Ok if used and washed off quickly.    ROS Review of Systems    Objective:    Physical Exam  BP (!) 142/72   Pulse 65   SpO2 100%  Wt Readings from Last 3 Encounters:  01/21/20 173 lb (78.5 kg)  12/31/18 165 lb (74.8 kg)  08/28/18 159 lb (72.1 kg)     Health Maintenance Due  Topic Date Due  . TETANUS/TDAP  03/21/2019  . INFLUENZA VACCINE  10/19/2019    There are no preventive care reminders to display for this patient.  Lab Results  Component Value Date   TSH 1.18 04/18/2018   Lab Results  Component Value Date   WBC 6.3 04/18/2018   HGB 15.7 (H) 04/18/2018   HCT 45.2 (H) 04/18/2018   MCV 97.4 04/18/2018   PLT 138 (L) 04/18/2018   Lab Results  Component Value Date   NA 141 04/18/2018   K 4.6 04/18/2018   CO2 29 04/18/2018   GLUCOSE 79 04/18/2018   BUN 10 04/18/2018   CREATININE 0.88 04/18/2018   BILITOT 0.7 04/18/2018   ALKPHOS 50 05/26/2015   AST 19 04/18/2018   ALT 16 04/18/2018   PROT 6.5 04/18/2018   ALBUMIN 4.2 05/26/2015   CALCIUM 9.8 04/18/2018   ANIONGAP 8 03/22/2016   GFR 64.61 05/16/2012   Lab Results  Component Value Date   CHOL 194 05/17/2017   Lab Results  Component Value Date   HDL 54 05/17/2017   Lab Results  Component Value Date   LDLCALC 115 (H) 05/17/2017   Lab Results  Component Value Date   TRIG 130 05/17/2017   Lab Results  Component Value Date   CHOLHDL 3.6 05/17/2017   Lab Results  Component Value Date   HGBA1C 5.1 05/17/2017      Assessment & Plan:  Hypertension - Blood pressure within normal limits. Patient advised to continue current medications and follow up in 3 months with Dr Dianah Field.    Problem List Items Addressed This Visit    Essential hypertension, benign - Primary      No orders of the defined types  were placed in this encounter.   Follow-up: Return in about 3 months (around 04/29/2020) for HTN with Dr Dianah Field.    Durene Romans, Monico Blitz, [place]

## 2020-01-30 ENCOUNTER — Ambulatory Visit
Admission: RE | Admit: 2020-01-30 | Discharge: 2020-01-30 | Disposition: A | Discharge status: Home / Self Care | Payer: Medicare HMO | Source: Ambulatory Visit | Attending: Orthopaedic Surgery | Admitting: Orthopaedic Surgery

## 2020-01-30 DIAGNOSIS — M25511 Pain in right shoulder: Secondary | ICD-10-CM

## 2020-01-30 DIAGNOSIS — M19011 Primary osteoarthritis, right shoulder: Secondary | ICD-10-CM | POA: Diagnosis not present

## 2020-02-02 ENCOUNTER — Other Ambulatory Visit: Payer: Self-pay | Admitting: Sports Medicine

## 2020-02-09 ENCOUNTER — Other Ambulatory Visit: Payer: Self-pay

## 2020-02-09 ENCOUNTER — Encounter (HOSPITAL_BASED_OUTPATIENT_CLINIC_OR_DEPARTMENT_OTHER): Payer: Self-pay | Admitting: Orthopaedic Surgery

## 2020-02-14 ENCOUNTER — Other Ambulatory Visit (HOSPITAL_COMMUNITY)
Admission: RE | Admit: 2020-02-14 | Discharge: 2020-02-14 | Disposition: A | Discharge status: Home / Self Care | Payer: Medicare HMO | Source: Ambulatory Visit | Attending: Orthopaedic Surgery | Admitting: Orthopaedic Surgery

## 2020-02-14 DIAGNOSIS — Z20822 Contact with and (suspected) exposure to covid-19: Secondary | ICD-10-CM | POA: Insufficient documentation

## 2020-02-14 DIAGNOSIS — Z01812 Encounter for preprocedural laboratory examination: Secondary | ICD-10-CM | POA: Insufficient documentation

## 2020-02-15 LAB — SARS CORONAVIRUS 2 (TAT 6-24 HRS): SARS Coronavirus 2: NEGATIVE

## 2020-02-16 ENCOUNTER — Ambulatory Visit: Payer: Self-pay

## 2020-02-16 ENCOUNTER — Encounter (HOSPITAL_BASED_OUTPATIENT_CLINIC_OR_DEPARTMENT_OTHER)
Admission: RE | Admit: 2020-02-16 | Discharge: 2020-02-16 | Disposition: A | Discharge status: Home / Self Care | Payer: Medicare HMO | Source: Ambulatory Visit | Attending: Orthopaedic Surgery | Admitting: Orthopaedic Surgery

## 2020-02-16 ENCOUNTER — Ambulatory Visit (INDEPENDENT_AMBULATORY_CARE_PROVIDER_SITE_OTHER): Payer: Medicare HMO | Admitting: Sports Medicine

## 2020-02-16 DIAGNOSIS — M47816 Spondylosis without myelopathy or radiculopathy, lumbar region: Secondary | ICD-10-CM

## 2020-02-16 DIAGNOSIS — Z791 Long term (current) use of non-steroidal anti-inflammatories (NSAID): Secondary | ICD-10-CM | POA: Diagnosis not present

## 2020-02-16 DIAGNOSIS — Z888 Allergy status to other drugs, medicaments and biological substances status: Secondary | ICD-10-CM | POA: Diagnosis not present

## 2020-02-16 DIAGNOSIS — I10 Essential (primary) hypertension: Secondary | ICD-10-CM | POA: Diagnosis not present

## 2020-02-16 DIAGNOSIS — Z79899 Other long term (current) drug therapy: Secondary | ICD-10-CM | POA: Diagnosis not present

## 2020-02-16 DIAGNOSIS — M19011 Primary osteoarthritis, right shoulder: Secondary | ICD-10-CM | POA: Diagnosis not present

## 2020-02-16 LAB — BASIC METABOLIC PANEL
Anion gap: 11 (ref 5–15)
BUN: 15 mg/dL (ref 8–23)
CO2: 24 mmol/L (ref 22–32)
Calcium: 9.6 mg/dL (ref 8.9–10.3)
Chloride: 104 mmol/L (ref 98–111)
Creatinine, Ser: 0.96 mg/dL (ref 0.44–1.00)
GFR, Estimated: >60 mL/min (ref >60)
Glucose, Bld: 94 mg/dL (ref 70–99)
Potassium: 4.9 mmol/L (ref 3.5–5.1)
Sodium: 139 mmol/L (ref 135–145)

## 2020-02-16 LAB — SURGICAL PCR SCREEN
MRSA, PCR: NEGATIVE
Staphylococcus aureus: NEGATIVE

## 2020-02-16 NOTE — Assessment & Plan Note (Addendum)
Worsening of SI joint pain. Repeat left sacroiliac joint injection, previous injection was in August of this year, return as needed.

## 2020-02-16 NOTE — Progress Notes (Signed)
    Procedures performed today:    Procedure: Real-time Ultrasound Guided injection of the left sacroiliac joint Device: Samsung HS60  Verbal informed consent obtained.  Time-out conducted.  Noted no overlying erythema, induration, or other signs of local infection.  Skin prepped in a sterile fashion.  Local anesthesia: Topical Ethyl chloride.  With sterile technique and under real time ultrasound guidance: 1 cc Kenalog 40, 2 cc lidocaine, 2 cc bupivacaine injected easily Completed without difficulty  Advised to call if fevers/chills, erythema, induration, drainage, or persistent bleeding.  Images permanently stored and available for review in PACS.  Impression: Technically successful ultrasound guided injection.  Independent interpretation of notes and tests performed by another provider:   None.  Brief History, Exam, Impression, and Recommendations:    Lumbar spondylosis with sacroiliac joint dysfunction Worsening of SI joint pain. Repeat left sacroiliac joint injection, previous injection was in August of this year, return as needed.    ___________________________________________ Gwen Her. Dianah Field, M.D., ABFM., CAQSM. Primary Care and [place] Instructor of [facility]

## 2020-02-16 NOTE — Progress Notes (Signed)
      Enhanced Recovery after Surgery for Orthopedics Enhanced Recovery after Surgery is a protocol used to improve the stress on your body and your recovery after surgery.  Patient Instructions  . The night before surgery:  o No food after midnight. ONLY clear liquids after midnight  . The day of surgery (if you do NOT have diabetes):  o Drink ONE (1) Pre-Surgery Clear Ensure as directed.   o This drink was given to you during your hospital  pre-op appointment visit. o The pre-op nurse will instruct you on the time to drink the  Pre-Surgery Ensure depending on your surgery time. o Finish the drink at the designated time by the pre-op nurse.  o Nothing else to drink after completing the  Pre-Surgery Clear Ensure.  . The day of surgery (if you have diabetes): o Drink ONE (1) Gatorade 2 (G2) as directed. o This drink was given to you during your hospital  pre-op appointment visit.  o The pre-op nurse will instruct you on the time to drink the   Gatorade 2 (G2) depending on your surgery time. o Color of the Gatorade may vary. Red is not allowed. o Nothing else to drink after completing the  Gatorade 2 (G2).         If you have questions, please contact your surgeon's office. Surgical soap given with instructions, pt verbalized understanding. Benzoyl peroxide gel given with written instructions, pt verbalized understanding.  

## 2020-02-17 ENCOUNTER — Other Ambulatory Visit: Payer: Self-pay | Admitting: Sports Medicine

## 2020-02-17 NOTE — H&P (Signed)
PREOPERATIVE H&P  Chief Complaint: PRIMARY OSTEOARTHRITIS RIGHT SHOULDER  HPI: [name] is a 69 y.o. female who is scheduled for Procedure(s): TOTAL SHOULDER ARTHROPLASTY.   Patient has a past medical history significant for PONV, HTN, HLD, GERD.   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  She has not had an injection since 2010.  She has not made progress with her shoulder recently.  She is into senior softball and has trouble throwing a ball.  She is unhappy with her shoulder function currently.    Her symptoms are rated as moderate to severe, and have been worsening.  This is significantly impairing activities of daily living.    Please see clinic note for further details on this patient's care.    She has elected for surgical management.   Past Medical History:  Diagnosis Date  . Abnormal vaginal Pap smear    monitored annually by Dr. Dory Horn  . Colon polyps    multple right colon polyps  . Degenerative joint disease   . GERD (gastroesophageal reflux disease)   . Hyperlipidemia    NMR: LDL 120 (1267/584), HDL 59, TG 60. LDL goall<130, ideally <100  . Hypertension   . Hypokalemia    on HCTZ/Triam  . PONV (postoperative nausea and vomiting)   . Vitamin D deficiency    2000 IU   Past Surgical History:  Procedure Laterality Date  . ABDOMINAL HYSTERECTOMY    . APPENDECTOMY    . BREAST BIOPSY    . BREAST SURGERY     left breast biopsy  . BUNIONECTOMY  03/2009  . CHOLECYSTECTOMY  09/2007   Lap, Dr.Gerkin   . COLONOSCOPY  2008   virtual colonoscopy  . DILATION AND CURETTAGE OF UTERUS    . ENDOMETRIAL BIOPSY  04-28-13   Dr Nori Riis  . ENDOSCOPIC CONCHA BULLOSA RESECTION Left 03/24/2013   Procedure: ENDOSCOPIC CONCHA BULLOSA RESECTION;  Surgeon: Izora Gala, MD;  Location: [place];  Service: ENT;  Laterality: Left;  . ETHMOIDECTOMY Left 03/24/2013   Procedure:  ETHMOIDECTOMY;  Surgeon: Izora Gala, MD;  Location: [place];  Service: ENT;  Laterality: Left;  . HAMMER TOE SURGERY    . HYSTEROSCOPY     w/ D and C in 09/2005,02/2007,03/2008 (+ poly removal); IDU insertion 09/2008, Dr.Neal  . NEURECTOMY FOOT  03/2008   left  . NEUROMA SURGERY     left foot ,Dr.Sikora  . PARTIAL COLECTOMY Right 03/30/2016   Procedure: RIGHT COLECTOMY;  Surgeon: Armandina Gemma, MD;  Location: [place];  Service: General;  Laterality: Right;  . TOE SURGERY     for bone spur  . TUBAL LIGATION     Social History   Socioeconomic History  . Marital status: Married    Spouse name: Not on file  . Number of children: Not on file  . Years of education: Not on file  . Highest education level: Not on file  Occupational History  . Occupation: Marine scientist, [employer]   Tobacco Use  . Smoking status: Never Smoker  . Smokeless tobacco: Never Used  Substance and Sexual Activity  . Alcohol use: Yes    Comment: occasional  . Drug use: No  . Sexual activity: Yes    Birth control/protection: Surgical  Other Topics Concern  . Not on file  Social History Narrative   Heart Healthy Diet  Social Determinants of Health   Financial Resource Strain:   . Difficulty of Paying Living Expenses: Not on file  Food Insecurity:   . Worried About Charity fundraiser in the Last Year: Not on file  . Ran Out of Food in the Last Year: Not on file  Transportation Needs:   . Lack of Transportation (Medical): Not on file  . Lack of Transportation (Non-Medical): Not on file  Physical Activity:   . Days of Exercise per Week: Not on file  . Minutes of Exercise per Session: Not on file  Stress:   . Feeling of Stress : Not on file  Social Connections:   . Frequency of Communication with Friends and Family: Not on file  . Frequency of Social Gatherings with Friends and Family: Not on file  . Attends Religious Services: Not on file  . Active Member of Clubs or Organizations: Not  on file  . Attends Archivist Meetings: Not on file  . Marital Status: Not on file   Family History  Problem Relation Age of Onset  . Heart attack Father 35       CABG 4-vessel  . Diabetes Father        late onset  . Lung cancer Mother        former smoker  . Diabetes Mother        late onset  . Breast cancer Mother   . Obesity Sister   . Stroke Paternal Grandfather 68  . Lung cancer Maternal Grandfather        former smoker  . Breast cancer Maternal Aunt   . Heart attack Paternal Grandmother 49  . Diabetes type I Other        grand daughter   Allergies  Allergen Reactions  . Hydrochlorothiazide W-Triamterene Other (See Comments)    hypokalemia  . Povidone-Iodine Itching    Intolerance, if exposed over a long period.  Ok if used and washed off quickly.   Prior to Admission medications   Medication Sig Start Date End Date Taking? Authorizing Provider  calcium carbonate (OS-CAL) 600 MG tablet Take 600 mg by mouth 2 (two) times daily with a meal.   Yes [provider]  Cholecalciferol (VITAMIN D3) 1000 UNITS tablet Take 1,000 Units by mouth daily.     Yes [provider]  clotrimazole-betamethasone (LOTRISONE) cream Apply 1 application topically 2 (two) times daily. 04/18/18  Yes Silverio Decamp, MD  estradiol (MINIVELLE) 0.1 MG/24HR Place 1 patch onto the skin 2 (two) times a week.   Yes [provider]  famotidine (PEPCID AC) 10 MG chewable tablet Chew 10 mg by mouth as needed.     Yes [provider]  HYDROcodone-acetaminophen (NORCO/VICODIN) 5-325 MG tablet Take 1 tablet by mouth every 8 (eight) hours as needed for moderate pain. 11/12/19  Yes Silverio Decamp, MD  meloxicam (MOBIC) 15 MG tablet TAKE 1 TABLET DAILY 12/06/19  Yes Silverio Decamp, MD  metoprolol succinate (TOPROL-XL) 25 MG 24 hr tablet Take 1 tablet (25 mg total) by mouth daily. 01/21/20  Yes Silverio Decamp, MD  pravastatin (PRAVACHOL) 20 MG  tablet TAKE 1 TABLET DAILY 12/06/19  Yes Silverio Decamp, MD  spironolactone (ALDACTONE) 25 MG tablet Take 1 tablet (25 mg total) by mouth 2 (two) times daily. 09/29/19  Yes Silverio Decamp, MD  tiZANidine (ZANAFLEX) 4 MG tablet TAKE 1 TABLET EVERY 6 HOURSAS NEEDED FOR MUSCLE SPASMS 02/02/20  Yes Aundria Mems  J, MD  Triamcinolone Acetonide (TRIAMCINOLONE 0.1 % CREAM : EUCERIN) CREA Apply 1 application topically 2 (two) times daily. 10/21/19  Yes Silverio Decamp, MD  zolpidem (AMBIEN) 5 MG tablet TAKE ONE TABLET BY MOUTH EVERY 3RD NIGHT PRN AT [facility] Patient taking differently: Take 5 mg by mouth at bedtime as needed.  01/16/14  Yes Hendricks Limes, MD  Omega-3 Fatty Acids (FISH OIL) 1200 MG CAPS Take 1 capsule by mouth daily.     [provider]    ROS: All other systems have been reviewed and were otherwise negative with the exception of those mentioned in the HPI and as above.  Physical Exam: General: Alert, no acute distress Cardiovascular: No pedal edema Respiratory: No cyanosis, no use of accessory musculature GI: No organomegaly, abdomen is soft and non-tender Skin: No lesions in the area of chief complaint Neurologic: Sensation intact distally Psychiatric: Patient is competent for consent with normal mood and affect Lymphatic: No axillary or cervical lymphadenopathy  MUSCULOSKELETAL:  Right shoulder: Range of motion of the bilateral shoulders demonstrate right shoulder range of motion to 150, passively and actively.  External rotation to 45 degrees and symmetric.  Internal rotation to her back pocket versus T10.  Contralateral side demonstrates active forward elevation to 160.  Cuff strength is 5/5 bilaterally.  No incisions or marks around the shoulder.     Imaging: X-rays demonstrate end stage bone on bone osteoarthritis with preservation of the acromiohumeral interval.    Assessment: PRIMARY OSTEOARTHRITIS RIGHT SHOULDER  Plan: Plan for  Procedure(s): TOTAL SHOULDER ARTHROPLASTY  The risks benefits and alternatives were discussed with the patient including but not limited to the risks of nonoperative treatment, versus surgical intervention including infection, bleeding, nerve injury,  blood clots, cardiopulmonary complications, morbidity, mortality, among others, and they were willing to proceed.   We additionally specifically discussed risks of axillary nerve injury, infection, periprosthetic fracture, continued pain and longevity of implants prior to beginning procedure.    Patient will be closely monitored in PACU for medical stabilization and pain control. If found stable in PACU, patient may be discharged home with outpatient follow-up. If any concerns regarding patient's stabilization patient will be admitted for observation after surgery. The patient is planning to be discharged home with outpatient PT.   The patient acknowledged the explanation, agreed to proceed with the plan and consent was signed.   Preoperative templating of the joint placement has been completed, documented and submitted to appropriate personnel in order to optimize intraoperative equipment management.   Patient has received operative clearance from her PCP, Dr. Dianah Field.   Operative Plan: Right total shoulder arthroplasty  Discharge Medications: Tylenol, Celebrex, Oxycodone, Zofran, Omeprazole DVT Prophylaxis: Aspirin Physical Therapy: Outpatient PT Special Discharge needs: [place], PA-C  02/17/2020 4:37 PM

## 2020-02-18 ENCOUNTER — Ambulatory Visit (HOSPITAL_BASED_OUTPATIENT_CLINIC_OR_DEPARTMENT_OTHER)
Admission: RE | Admit: 2020-02-18 | Discharge: 2020-02-18 | Disposition: A | Discharge status: Home / Self Care | Payer: Medicare HMO | Attending: Orthopaedic Surgery | Admitting: Orthopaedic Surgery

## 2020-02-18 ENCOUNTER — Other Ambulatory Visit: Payer: Self-pay

## 2020-02-18 ENCOUNTER — Ambulatory Visit (HOSPITAL_BASED_OUTPATIENT_CLINIC_OR_DEPARTMENT_OTHER): Payer: Medicare HMO | Admitting: Anesthesiology

## 2020-02-18 ENCOUNTER — Ambulatory Visit (HOSPITAL_COMMUNITY): Payer: Medicare HMO

## 2020-02-18 ENCOUNTER — Encounter (HOSPITAL_BASED_OUTPATIENT_CLINIC_OR_DEPARTMENT_OTHER): Payer: Self-pay | Admitting: Orthopaedic Surgery

## 2020-02-18 ENCOUNTER — Encounter (HOSPITAL_BASED_OUTPATIENT_CLINIC_OR_DEPARTMENT_OTHER)
Admission: RE | Disposition: A | Discharge status: Home / Self Care | Payer: Self-pay | Source: Home / Self Care | Attending: Orthopaedic Surgery

## 2020-02-18 DIAGNOSIS — Z471 Aftercare following joint replacement surgery: Secondary | ICD-10-CM | POA: Diagnosis not present

## 2020-02-18 DIAGNOSIS — M19011 Primary osteoarthritis, right shoulder: Secondary | ICD-10-CM | POA: Diagnosis not present

## 2020-02-18 DIAGNOSIS — Z96611 Presence of right artificial shoulder joint: Secondary | ICD-10-CM | POA: Diagnosis not present

## 2020-02-18 DIAGNOSIS — I10 Essential (primary) hypertension: Secondary | ICD-10-CM | POA: Diagnosis not present

## 2020-02-18 DIAGNOSIS — Z79899 Other long term (current) drug therapy: Secondary | ICD-10-CM | POA: Insufficient documentation

## 2020-02-18 DIAGNOSIS — Z791 Long term (current) use of non-steroidal anti-inflammatories (NSAID): Secondary | ICD-10-CM | POA: Diagnosis not present

## 2020-02-18 DIAGNOSIS — Z09 Encounter for follow-up examination after completed treatment for conditions other than malignant neoplasm: Secondary | ICD-10-CM

## 2020-02-18 DIAGNOSIS — Z888 Allergy status to other drugs, medicaments and biological substances status: Secondary | ICD-10-CM | POA: Diagnosis not present

## 2020-02-18 DIAGNOSIS — G8918 Other acute postprocedural pain: Secondary | ICD-10-CM | POA: Diagnosis not present

## 2020-02-18 DIAGNOSIS — K219 Gastro-esophageal reflux disease without esophagitis: Secondary | ICD-10-CM | POA: Diagnosis not present

## 2020-02-18 HISTORY — PX: TOTAL SHOULDER ARTHROPLASTY: SHX126

## 2020-02-18 SURGERY — ARTHROPLASTY, SHOULDER, TOTAL
Anesthesia: General | Site: Shoulder | Laterality: Right

## 2020-02-18 MED ORDER — PHENYLEPHRINE HCL (PRESSORS) 10 MG/ML IV SOLN
INTRAVENOUS | Status: DC | PRN
  Administered 2020-02-18: 50 ug via INTRAVENOUS
  Administered 2020-02-18 (×2): 40 ug via INTRAVENOUS

## 2020-02-18 MED ORDER — LACTATED RINGERS IV SOLN: INTRAVENOUS | Status: DC

## 2020-02-18 MED ORDER — OXYCODONE HCL 5 MG PO TABS
ORAL_TABLET | ORAL | 0 refills | Status: AC
Start: 2020-02-18 — End: 2020-02-23

## 2020-02-18 MED ORDER — PROMETHAZINE HCL 25 MG/ML IJ SOLN: 6.2500 mg | INTRAMUSCULAR | Status: DC | PRN

## 2020-02-18 MED ORDER — MIDAZOLAM HCL 2 MG/2ML IJ SOLN
2.0000 mg | Freq: Once | INTRAMUSCULAR | Status: AC
  Administered 2020-02-18: 2 mg via INTRAVENOUS

## 2020-02-18 MED ORDER — PROPOFOL 10 MG/ML IV BOLUS
INTRAVENOUS | Status: AC
  Filled 2020-02-18: qty 20

## 2020-02-18 MED ORDER — CELECOXIB 200 MG PO CAPS
ORAL_CAPSULE | ORAL | Status: AC
  Filled 2020-02-18: qty 4

## 2020-02-18 MED ORDER — MELOXICAM 15 MG PO TABS: 15.0000 mg | ORAL_TABLET | Freq: Every day | ORAL | 0 refills | Status: DC

## 2020-02-18 MED ORDER — GLYCOPYRROLATE 0.2 MG/ML IJ SOLN
INTRAMUSCULAR | Status: DC | PRN
  Administered 2020-02-18: .2 mg via INTRAVENOUS

## 2020-02-18 MED ORDER — SUGAMMADEX SODIUM 200 MG/2ML IV SOLN
INTRAVENOUS | Status: DC | PRN
  Administered 2020-02-18: 200 mg via INTRAVENOUS

## 2020-02-18 MED ORDER — SCOPOLAMINE 1 MG/3DAYS TD PT72
MEDICATED_PATCH | TRANSDERMAL | Status: AC
  Filled 2020-02-18: qty 1

## 2020-02-18 MED ORDER — ROCURONIUM BROMIDE 10 MG/ML (PF) SYRINGE
PREFILLED_SYRINGE | INTRAVENOUS | Status: AC
  Filled 2020-02-18: qty 10

## 2020-02-18 MED ORDER — GABAPENTIN 300 MG PO CAPS
300.0000 mg | ORAL_CAPSULE | Freq: Once | ORAL | Status: AC
  Administered 2020-02-18: 300 mg via ORAL

## 2020-02-18 MED ORDER — PHENYLEPHRINE HCL (PRESSORS) 10 MG/ML IV SOLN
INTRAVENOUS | Status: AC
  Filled 2020-02-18: qty 1

## 2020-02-18 MED ORDER — EPHEDRINE SULFATE 50 MG/ML IJ SOLN
INTRAMUSCULAR | Status: DC | PRN
  Administered 2020-02-18 (×2): 5 mg via INTRAVENOUS

## 2020-02-18 MED ORDER — CELECOXIB 200 MG PO CAPS
ORAL_CAPSULE | ORAL | Status: AC
  Filled 2020-02-18: qty 1

## 2020-02-18 MED ORDER — CEFAZOLIN SODIUM-DEXTROSE 2-4 GM/100ML-% IV SOLN
2.0000 g | INTRAVENOUS | Status: AC
  Administered 2020-02-18: 2 g via INTRAVENOUS

## 2020-02-18 MED ORDER — VANCOMYCIN HCL 1000 MG IV SOLR
INTRAVENOUS | Status: AC
  Filled 2020-02-18: qty 1000

## 2020-02-18 MED ORDER — GLYCOPYRROLATE PF 0.2 MG/ML IJ SOSY
PREFILLED_SYRINGE | INTRAMUSCULAR | Status: AC
  Filled 2020-02-18: qty 1

## 2020-02-18 MED ORDER — ROCURONIUM BROMIDE 100 MG/10ML IV SOLN
INTRAVENOUS | Status: DC | PRN
  Administered 2020-02-18: 80 mg via INTRAVENOUS

## 2020-02-18 MED ORDER — ONDANSETRON HCL 4 MG PO TABS: 4.0000 mg | ORAL_TABLET | Freq: Three times a day (TID) | ORAL | 1 refills | Status: AC | PRN

## 2020-02-18 MED ORDER — ACETAMINOPHEN 500 MG PO TABS
ORAL_TABLET | ORAL | Status: AC
  Filled 2020-02-18: qty 2

## 2020-02-18 MED ORDER — FENTANYL CITRATE (PF) 100 MCG/2ML IJ SOLN
100.0000 ug | Freq: Once | INTRAMUSCULAR | Status: AC
  Administered 2020-02-18: 100 ug via INTRAVENOUS

## 2020-02-18 MED ORDER — OXYCODONE HCL 5 MG/5ML PO SOLN: 5.0000 mg | Freq: Once | ORAL | Status: AC | PRN

## 2020-02-18 MED ORDER — MIDAZOLAM HCL 5 MG/5ML IJ SOLN
INTRAMUSCULAR | Status: DC | PRN
  Administered 2020-02-18: .5 mg via INTRAVENOUS

## 2020-02-18 MED ORDER — LACTATED RINGERS IV BOLUS: 500.0000 mL | Freq: Once | INTRAVENOUS | Status: DC

## 2020-02-18 MED ORDER — FENTANYL CITRATE (PF) 100 MCG/2ML IJ SOLN
INTRAMUSCULAR | Status: AC
  Filled 2020-02-18: qty 2

## 2020-02-18 MED ORDER — TRANEXAMIC ACID-NACL 1000-0.7 MG/100ML-% IV SOLN
INTRAVENOUS | Status: AC
  Filled 2020-02-18: qty 100

## 2020-02-18 MED ORDER — ACETAMINOPHEN 500 MG PO TABS
1000.0000 mg | ORAL_TABLET | Freq: Once | ORAL | Status: AC
  Administered 2020-02-18: 1000 mg via ORAL

## 2020-02-18 MED ORDER — FENTANYL CITRATE (PF) 100 MCG/2ML IJ SOLN
INTRAMUSCULAR | Status: DC | PRN
  Administered 2020-02-18: 25 ug via INTRAVENOUS

## 2020-02-18 MED ORDER — MIDAZOLAM HCL 2 MG/2ML IJ SOLN
INTRAMUSCULAR | Status: AC
  Filled 2020-02-18: qty 2

## 2020-02-18 MED ORDER — OXYCODONE HCL 5 MG PO TABS
ORAL_TABLET | ORAL | Status: AC
  Filled 2020-02-18: qty 1

## 2020-02-18 MED ORDER — ACETAMINOPHEN 500 MG PO TABS: 1000.0000 mg | ORAL_TABLET | Freq: Three times a day (TID) | ORAL | 0 refills | Status: AC

## 2020-02-18 MED ORDER — CEFAZOLIN SODIUM-DEXTROSE 2-4 GM/100ML-% IV SOLN
INTRAVENOUS | Status: AC
  Filled 2020-02-18: qty 100

## 2020-02-18 MED ORDER — PROPOFOL 10 MG/ML IV BOLUS
INTRAVENOUS | Status: DC | PRN
  Administered 2020-02-18: 150 mg via INTRAVENOUS

## 2020-02-18 MED ORDER — SODIUM CHLORIDE 0.9 % IV SOLN
INTRAVENOUS | Status: AC | PRN
  Administered 2020-02-18: 1000 mL

## 2020-02-18 MED ORDER — LIDOCAINE 2% (20 MG/ML) 5 ML SYRINGE
INTRAMUSCULAR | Status: AC
  Filled 2020-02-18: qty 5

## 2020-02-18 MED ORDER — SCOPOLAMINE 1 MG/3DAYS TD PT72
1.0000 | MEDICATED_PATCH | TRANSDERMAL | Status: DC
  Administered 2020-02-18: 1.5 mg via TRANSDERMAL

## 2020-02-18 MED ORDER — DEXAMETHASONE SODIUM PHOSPHATE 10 MG/ML IJ SOLN
INTRAMUSCULAR | Status: AC
  Filled 2020-02-18: qty 1

## 2020-02-18 MED ORDER — ONDANSETRON HCL 4 MG/2ML IJ SOLN
INTRAMUSCULAR | Status: DC | PRN
  Administered 2020-02-18: 4 mg via INTRAVENOUS

## 2020-02-18 MED ORDER — BUPIVACAINE HCL (PF) 0.5 % IJ SOLN
INTRAMUSCULAR | Status: DC | PRN
  Administered 2020-02-18: 20 mL via PERINEURAL

## 2020-02-18 MED ORDER — CELECOXIB 200 MG PO CAPS
400.0000 mg | ORAL_CAPSULE | Freq: Once | ORAL | Status: AC
  Administered 2020-02-18: 400 mg via ORAL

## 2020-02-18 MED ORDER — MEPERIDINE HCL 25 MG/ML IJ SOLN: 6.2500 mg | INTRAMUSCULAR | Status: DC | PRN

## 2020-02-18 MED ORDER — TRANEXAMIC ACID-NACL 1000-0.7 MG/100ML-% IV SOLN
1000.0000 mg | INTRAVENOUS | Status: AC
  Administered 2020-02-18: 1000 mg via INTRAVENOUS

## 2020-02-18 MED ORDER — OXYCODONE HCL 5 MG PO TABS
5.0000 mg | ORAL_TABLET | Freq: Once | ORAL | Status: AC | PRN
  Administered 2020-02-18: 5 mg via ORAL

## 2020-02-18 MED ORDER — VANCOMYCIN HCL 1000 MG IV SOLR
INTRAVENOUS | Status: DC | PRN
  Administered 2020-02-18: 1000 mg via TOPICAL

## 2020-02-18 MED ORDER — OMEPRAZOLE 20 MG PO CPDR: 20.0000 mg | DELAYED_RELEASE_CAPSULE | Freq: Every day | ORAL | 0 refills | Status: DC

## 2020-02-18 MED ORDER — ONDANSETRON HCL 4 MG/2ML IJ SOLN
INTRAMUSCULAR | Status: AC
  Filled 2020-02-18: qty 2

## 2020-02-18 MED ORDER — LIDOCAINE HCL (CARDIAC) PF 100 MG/5ML IV SOSY
PREFILLED_SYRINGE | INTRAVENOUS | Status: DC | PRN
  Administered 2020-02-18: 50 mg via INTRAVENOUS

## 2020-02-18 MED ORDER — DEXAMETHASONE SODIUM PHOSPHATE 10 MG/ML IJ SOLN
INTRAMUSCULAR | Status: DC | PRN
  Administered 2020-02-18: 10 mg via INTRAVENOUS

## 2020-02-18 MED ORDER — BUPIVACAINE LIPOSOME 1.3 % IJ SUSP
INTRAMUSCULAR | Status: DC | PRN
  Administered 2020-02-18: 10 mL via PERINEURAL

## 2020-02-18 MED ORDER — ASPIRIN 81 MG PO CHEW: 81.0000 mg | CHEWABLE_TABLET | Freq: Two times a day (BID) | ORAL | 0 refills | Status: AC

## 2020-02-18 MED ORDER — HYDROMORPHONE HCL 1 MG/ML IJ SOLN: 0.2500 mg | INTRAMUSCULAR | Status: DC | PRN

## 2020-02-18 MED ORDER — GABAPENTIN 300 MG PO CAPS
ORAL_CAPSULE | ORAL | Status: AC
  Filled 2020-02-18: qty 1

## 2020-02-18 SURGICAL SUPPLY — 91 items
AID PSTN UNV HD RSTRNT DISP (MISCELLANEOUS) ×1
APL PRP STRL LF DISP 70% ISPRP (MISCELLANEOUS) ×1
BLADE HEX COATED 2.75 (ELECTRODE) IMPLANT
BLADE SAW SAG 29X58X.64 (BLADE) IMPLANT
BLADE SAW SAG 73X25 THK (BLADE) ×1
BLADE SAW SGTL 73X25 THK (BLADE) ×1 IMPLANT
BLADE SURG 10 STRL SS (BLADE) IMPLANT
BLADE SURG 15 STRL LF DISP TIS (BLADE) IMPLANT
BLADE SURG 15 STRL SS (BLADE)
CEMENT BONE DEPUY (Cement) ×2 IMPLANT
CHLORAPREP W/TINT 26 (MISCELLANEOUS) ×2 IMPLANT
CLSR STERI-STRIP ANTIMIC 1/2X4 (GAUZE/BANDAGES/DRESSINGS) ×2 IMPLANT
COMPONENT SZ1 NUCLEUS SHLDR (Miscellaneous) ×2 IMPLANT
COOLER ICEMAN CLASSIC (MISCELLANEOUS) ×2 IMPLANT
COVER BACK TABLE 60X90IN (DRAPES) ×2 IMPLANT
COVER MAYO STAND STRL (DRAPES) ×2 IMPLANT
COVER WAND RF STERILE (DRAPES) IMPLANT
DRAPE IMP U-DRAPE 54X76 (DRAPES) IMPLANT
DRAPE INCISE IOBAN 66X45 STRL (DRAPES) ×2 IMPLANT
DRAPE U-SHAPE 76X120 STRL (DRAPES) ×4 IMPLANT
DRSG AQUACEL AG ADV 3.5X 6 (GAUZE/BANDAGES/DRESSINGS) ×2 IMPLANT
ELECT BLADE 4.0 EZ CLEAN MEGAD (MISCELLANEOUS)
ELECT BLADE TIP CTD 4 INCH (ELECTRODE) ×2 IMPLANT
ELECT REM PT RETURN 9FT ADLT (ELECTROSURGICAL) ×2
ELECTRODE BLDE 4.0 EZ CLN MEGD (MISCELLANEOUS) IMPLANT
ELECTRODE REM PT RTRN 9FT ADLT (ELECTROSURGICAL) ×1 IMPLANT
GAUZE XEROFORM 1X8 LF (GAUZE/BANDAGES/DRESSINGS) IMPLANT
GLENOID PEG SHOULDER 40MM SML (Shoulder) ×1 IMPLANT
GLOVE BIO SURGEON STRL SZ 6.5 (GLOVE) ×4 IMPLANT
GLOVE BIOGEL PI IND STRL 6.5 (GLOVE) ×1 IMPLANT
GLOVE BIOGEL PI IND STRL 7.0 (GLOVE) ×2 IMPLANT
GLOVE BIOGEL PI IND STRL 7.5 (GLOVE) ×1 IMPLANT
GLOVE BIOGEL PI IND STRL 8 (GLOVE) ×1 IMPLANT
GLOVE BIOGEL PI INDICATOR 6.5 (GLOVE) ×1
GLOVE BIOGEL PI INDICATOR 7.0 (GLOVE) ×2
GLOVE BIOGEL PI INDICATOR 7.5 (GLOVE) ×1
GLOVE BIOGEL PI INDICATOR 8 (GLOVE) ×1
GLOVE ECLIPSE 6.5 STRL STRAW (GLOVE) ×6 IMPLANT
GLOVE ECLIPSE 8.0 STRL XLNG CF (GLOVE) ×6 IMPLANT
GLOVE SURG SYN 7.5  E (GLOVE) ×2
GLOVE SURG SYN 7.5 E (GLOVE) ×1 IMPLANT
GOWN SRG XL LVL 4 BRTHBL STRL (GOWNS) ×1 IMPLANT
GOWN STRL NON-REIN XL LVL4 (GOWNS) ×2
GOWN STRL REUS W/ TWL LRG LVL3 (GOWN DISPOSABLE) ×2 IMPLANT
GOWN STRL REUS W/TWL LRG LVL3 (GOWN DISPOSABLE) ×4
GOWN STRL REUS W/TWL XL LVL3 (GOWN DISPOSABLE) ×2 IMPLANT
GUIDE PIN 3X75 SHOULDER (PIN) ×2
GUIDEWIRE GLENOID 2.5X220 (WIRE) ×4 IMPLANT
HANDPIECE INTERPULSE COAX TIP (DISPOSABLE) ×2
IMPL HUMERAL HEAD 41X15 (Orthopedic Implant) ×1 IMPLANT
IMPLANT HUMERAL HEAD 41X15 (Orthopedic Implant) ×2 IMPLANT
KIT STABILIZATION SHOULDER (MISCELLANEOUS) ×2 IMPLANT
MANIFOLD NEPTUNE II (INSTRUMENTS) ×2 IMPLANT
NEEDLE MAYO CATGUT SZ4 (NEEDLE) IMPLANT
NEEDLE MAYO TROCAR (NEEDLE) ×2 IMPLANT
NS IRRIG 1000ML POUR BTL (IV SOLUTION) ×2 IMPLANT
PACK BASIN DAY SURGERY FS (CUSTOM PROCEDURE TRAY) ×2 IMPLANT
PACK SHOULDER (CUSTOM PROCEDURE TRAY) ×2 IMPLANT
PAD COLD SHLDR WRAP-ON (PAD) ×2 IMPLANT
PAD ORTHO SHOULDER 7X19 LRG (SOFTGOODS) ×2 IMPLANT
PIN GUIDE 3X75 SHOULDER (PIN) ×1 IMPLANT
RESTRAINT HEAD UNIVERSAL NS (MISCELLANEOUS) ×2 IMPLANT
SET HNDPC FAN SPRY TIP SCT (DISPOSABLE) ×1 IMPLANT
SHEET MEDIUM DRAPE 40X70 STRL (DRAPES) ×2 IMPLANT
SHOULDER GLENOID PEG 40MM SML (Shoulder) ×2 IMPLANT
SLEEVE SCD COMPRESS KNEE MED (MISCELLANEOUS) ×2 IMPLANT
SMARTMIX MINI TOWER (MISCELLANEOUS) ×2
SPONGE LAP 18X18 RF (DISPOSABLE) IMPLANT
SUCTION FRAZIER HANDLE 10FR (MISCELLANEOUS) ×2
SUCTION TUBE FRAZIER 10FR DISP (MISCELLANEOUS) ×1 IMPLANT
SUT ETHIBOND 0 MO6 C/R (SUTURE) IMPLANT
SUT ETHIBOND 2 OS 4 DA (SUTURE) ×2 IMPLANT
SUT ETHIBOND 2 V 37 (SUTURE) IMPLANT
SUT ETHIBOND NAB CT1 #1 30IN (SUTURE) ×2 IMPLANT
SUT FIBERWIRE #2 38 REV NDL BL (SUTURE)
SUT FIBERWIRE #5 38 CONV NDL (SUTURE) ×12
SUT MNCRL AB 4-0 PS2 18 (SUTURE) ×2 IMPLANT
SUT VIC AB 0 CT1 27 (SUTURE) ×2
SUT VIC AB 0 CT1 27XBRD ANBCTR (SUTURE) ×1 IMPLANT
SUT VIC AB 2-0 CT1 27 (SUTURE) ×2
SUT VIC AB 2-0 CT1 TAPERPNT 27 (SUTURE) ×1 IMPLANT
SUT VIC AB 3-0 CT1 27 (SUTURE)
SUT VIC AB 3-0 CT1 TAPERPNT 27 (SUTURE) IMPLANT
SUT VIC AB 3-0 SH 27 (SUTURE) ×2
SUT VIC AB 3-0 SH 27X BRD (SUTURE) ×1 IMPLANT
SUTURE FIBERWR #5 38 CONV NDL (SUTURE) ×6 IMPLANT
SUTURE FIBERWR#2 38 REV NDL BL (SUTURE) IMPLANT
TOWEL GREEN STERILE FF (TOWEL DISPOSABLE) ×6 IMPLANT
TOWER CARTRIDGE SMART MIX (DISPOSABLE) IMPLANT
TOWER SMARTMIX MINI (MISCELLANEOUS) ×1 IMPLANT
TUBE SUCTION HIGH CAP CLEAR NV (SUCTIONS) ×2 IMPLANT

## 2020-02-18 NOTE — Transfer of Care (Signed)
Immediate Anesthesia Transfer of Care Note  Patient: [name]  Procedure(s) Performed: TOTAL SHOULDER ARTHROPLASTY (Right Shoulder)  Patient Location: PACU  Anesthesia Type:General  Level of Consciousness: oriented, drowsy and patient cooperative  Airway & Oxygen Therapy: Patient Spontanous Breathing and Patient connected to face mask oxygen  Post-op Assessment: Report given to RN and Post -op Vital signs reviewed and stable  Post vital signs: Reviewed and stable  Last Vitals:  Vitals Value Taken Time  BP 130/74 02/18/20 1209  Temp    Pulse 70 02/18/20 1211  Resp 17 02/18/20 1211  SpO2 100 % 02/18/20 1211  Vitals shown include unvalidated device data.  Last Pain:  Vitals:   02/18/20 0848  TempSrc: Oral      Patients Stated Pain Goal: 3 (43/92/65 9978)  Complications: No complications documented.

## 2020-02-18 NOTE — Interval H&P Note (Signed)
History and Physical Interval Note:  02/18/2020 10:03 AM  [name]  has presented today for surgery, with the diagnosis of PRIMARY OSTEOARTHRITIS RIGHT SHOULDER.  The various methods of treatment have been discussed with the patient and family. After consideration of risks, benefits and other options for treatment, the patient has consented to  Procedure(s): TOTAL SHOULDER ARTHROPLASTY (Right) as a surgical intervention.  The patient's history has been reviewed, patient examined, no change in status, stable for surgery.  I have reviewed the patient's chart and labs.  Questions were answered to the patient's satisfaction.     Hiram Gash

## 2020-02-18 NOTE — Op Note (Signed)
Orthopaedic Surgery Operative Note (CSN: [id number])  [name]  Nov 21, 1950 Date of Surgery: 02/18/2020   Diagnoses:  PRIMARY OSTEOARTHRITIS RIGHT SHOULDER  Procedure: Right anatomic stemless total Shoulder Arthroplasty   Operative Finding Successful completion of planned procedure.  Using the preoperative planning software, blueprint, we were able to identify the patient had very unique glenoid anatomy and had a very small glenoid involved.  We were able to carefully plan and were able to obtain an intact vault and good placement of all 3 pegs.  There was no perforation.  We had a robust construct and our subscapularis repair was also very stable.  Patient had more retroversion than his typical but we cut to her standard native retroversion as is typical with the stemless implants.  Post-operative plan: The patient will be NWB in sling.  The patient will be will be discharged from PACU if continues to be stable as was plan prior to surgery.  DVT prophylaxis Aspirin 81 mg twice daily for 6 weeks.  Pain control with PRN pain medication preferring oral medicines.  Follow up plan will be scheduled in approximately 7 days for incision check and XR.  Physical therapy to start after first visit.  Implants: Tornier simplicity size 1 nucleus, 41 head, 40 small glenoid  Post-Op Diagnosis: Same Surgeons:Primary: Hiram Gash, MD Assistants:Caroline McBane PA-C Location: [facility] OR ROOM 6 Anesthesia: General with Exparel Interscalene Antibiotics: Ancef 2g preop, Vancomycin 1000mg  locally Tourniquet time: None Estimated Blood Loss: 683 Complications: None Specimens: None Implants: Implant Name Type Inv. Item Serial No. Manufacturer Lot No. LRB No. Used Action  CEMENT BONE DEPUY - [id number] Cement CEMENT BONE DEPUY  DEPUY SYNTHES [id number] Right 1 Implanted  SHOULDER GLENOID PEG 40MM SML - [id number] Shoulder SHOULDER GLENOID PEG 40MM SML [id number] [employer]  Right 1 Implanted  IMPLANT HUMERAL  HEAD 41X15 - [id number] Orthopedic Implant IMPLANT HUMERAL HEAD 41X15 [id number] [employer]  Right 1 Implanted  COMPONENT SZ1 NUCLEUS SHLDR - [id number] Miscellaneous COMPONENT SZ1 NUCLEUS SHLDR [id number] [employer]  Right 1 Implanted    Indications for Surgery:   [name] is a 69 y.o. female with end-stage glenohumeral osteoarthritis.  Benefits and risks of operative and nonoperative management were discussed prior to surgery with patient/guardian(s) and informed consent form was completed.  Infection and need for further surgery were discussed as was prosthetic stability and cuff issues.  We additionally specifically discussed risks of axillary nerve injury, infection, periprosthetic fracture, continued pain and longevity of implants prior to beginning procedure.      Procedure:   The patient was identified in the preoperative holding area where the surgical site was marked. Block placed by anesthesia with exparel.  The patient was taken to the OR where a procedural timeout was called and the above noted anesthesia was induced.  The patient was positioned beachchair on allen table with spider arm positioner.  Preoperative antibiotics were dosed.  The patient's right shoulder was prepped and draped in the usual sterile fashion.  A second preoperative timeout was called.       Standard deltopectoral approach was performed with a #10 blade. We dissected down to the subcutaneous tissues and the cephalic vein was taken laterally with the deltoid. Clavipectoral fascia was incised in line with the incision. Deep retractors were placed. The long of the biceps tendon was identified and there was significant tenosynovitis present.  Tenodesis was performed to the pectoralis tendon with #2 Ethibond. The remaining biceps was followed up into the  rotator interval where it was released. The subscapularis was taken down with a lesser tuberosity osteotomy using an osteotome. The osteotomy fragment and  underlying capsular elevated off of the humeral neck and the osteophytes inferiorly. #2 Ethibond sutures are passed through the bone tendon junction for subscap manipulation. We continued releasing the capsule directly off of the osteophytes inferiorly all the way around the corner. This allowed [place] to dislocate the humeral head. The humeral head had evidence of severe osteoarthritic wear with full-thickness cartilage loss and exposed subchondral bone. There was significant flattening of the humeral head.   The rotator cuff was carefully examined and noted to be intact without sign of wear.  The decision was confirmed that an anatomic total shoulder was indicated for this patient.  There were osteophytes along the inferior humeral neck. The osteophytes were removed with an osteotome and a rongeur.  Osteophytes were removed with a rongeur and an osteotome and the anatomic neck was well visualized.   We next made our humeral osteotomy with an oscillating saw along the anatomic neck. The head fragment was passed off the back table and measured approximately 42 mm in diameter.   Bone quality was reasonable and we decided that it was appropriate to use simpliciti stemless implants and placed a guidepin perpendicular to our cut using a guide.  This obtained bicortical purchase.  We then reamed off of this to a flat surface and drilled and placed our nucleus.  A cut protector was placed.  The subscapularis was again identified and immediately we took care to palpate the axillary nerve anteriorly and verify its position with gentle palpation as well as the tug test.  We then released the SGHL with bovie cautery prior to placing a curved mayo at the junction of the anterior glenoid well above the axillary nerve and bluntly dissecting the subscapularis from the capsule.  We then carefully protected the axillary nerve as we gently released the inferior capsule to fully mobilize the subscapularis.  An anterior deltoid  retractor was then placed as well as a small Hohmann retractor superiorly.    The glenoid was inspected and had evidence of severe osteoarthritic wear with full-thickness cartilage loss and exposed subchondral bone. The remaining labrum was removed circumferentially taking great care not to disrupt the posterior capsule. The glenoid was sized as listed in the implants above. The center hole was marked and we used a glenoid drill guide to place a guide pin in the center position.  The glenoid was reamed concentrically over the guide pin. Next the center hole was enlarged and the drill guide for the peripheral pegs was placed. The vault was intact. The peripheral pegs were drilled in standard fashion. A trial glenoid was placed and was stable. We removed the trial components.   The glenoid bone was prepared with pulsatile lavage and a sponge to dry the bone prior to cement application. Cement was mixed on the back table the peripheral peg holes were cemented. The glenoid was impacted securely.   We turned attention back to the humeral side. The cut protector was removed. We trialed with multiple size head options and selected a 41 which re-created the patient's anatomy. The offset was dialed in to match the normal anatomy. The shoulder was trialed.  There was good ROM in all planes and the shoulder was stable with approximately 50% posterior spring back and no inferior translation.  The real humeral implants were opened on the back table and assembled.  The trial  was removed. #5 Fiberwire sutures passed through the humeral neck for subscap repair. The humeral component was press-fit obtaining a secure fit.  The joint was reduced and thoroughly irrigated with pulsatile lavage. Subscap and lesser tuberosity osteotomy repaired back in a double row fashion with #5 Fiberwire sutures through bone tunnels. Next the rotator interval was closed with #2 ethibond suture. Hemostasis was obtained. The deltopectoral  interval was reapproximated with #1 Ethibond. The subcutaneous tissues were closed with 2-0 Vicryl and the skin was closed with a running monocryl. The incisions were cleaned and dried and an Aquacel dressing was placed. The drapes taken down. The arm was placed into sling with abduction pillow. Patient was awakened, extubated, and transferred to the recovery room in stable condition. There were no intraoperative complications. The sponge, needle, and attention counts were correct at the end of the case.   Noemi Chapel, PA-C, present and scrubbed throughout the case, critical for completion in a timely fashion, and for retraction, instrumentation, closure.

## 2020-02-18 NOTE — Progress Notes (Signed)
Assisted Dr. Miller with right, ultrasound guided, interscalene  block. Side rails up, monitors on throughout procedure. See vital signs in flow sheet. Tolerated Procedure well. 

## 2020-02-18 NOTE — Anesthesia Procedure Notes (Signed)
Anesthesia Regional Block: Interscalene brachial plexus block   Pre-Anesthetic Checklist: ,, timeout performed, Correct Patient, Correct Site, Correct Laterality, Correct Procedure, Correct Position, site marked, Risks and benefits discussed,  Surgical consent,  Pre-op evaluation,  At surgeon's request and post-op pain management  Laterality: Right  Prep: chloraprep       Needles:  Injection technique: Single-shot  Needle Type: Stimiplex     Needle Length: 9cm  Needle Gauge: 21     Additional Needles:   Procedures:,,,, ultrasound used (permanent image in chart),,,,  Narrative:  Start time: 02/18/2020 10:03 AM End time: 02/18/2020 10:08 AM Injection made incrementally with aspirations every 5 mL.  Performed by: Personally  Anesthesiologist: Lynda Rainwater, MD

## 2020-02-18 NOTE — Anesthesia Procedure Notes (Signed)

## 2020-02-18 NOTE — Anesthesia Postprocedure Evaluation (Signed)
Anesthesia Post Note  Patient: [name]  Procedure(s) Performed: TOTAL SHOULDER ARTHROPLASTY (Right Shoulder)     Patient location during evaluation: PACU Anesthesia Type: General Level of consciousness: awake and alert Pain management: pain level controlled Vital Signs Assessment: post-procedure vital signs reviewed and stable Respiratory status: spontaneous breathing, nonlabored ventilation and respiratory function stable Cardiovascular status: blood pressure returned to baseline and stable Postop Assessment: no apparent nausea or vomiting Anesthetic complications: no   No complications documented.  Last Vitals:  Vitals:   02/18/20 1239 02/18/20 1245  BP:  (!) 144/75  Pulse: 68 (!) 59  Resp: 15 18  Temp:    SpO2: 98% 98%    Last Pain:  Vitals:   02/18/20 1302  TempSrc:   PainSc: [place]

## 2020-02-18 NOTE — Discharge Instructions (Signed)
*No Tylenol until after 3pm *No Ibuprofen/motrin until after 5pm   Post Anesthesia Home Care Instructions  Activity: Get plenty of rest for the remainder of the day. A responsible individual must stay with you for 24 hours following the procedure.  For the next 24 hours, DO NOT: -Drive a car -Paediatric nurse -Drink alcoholic beverages -Take any medication unless instructed by your physician -Make any legal decisions or sign important papers.  Meals: Start with liquid foods such as gelatin or soup. Progress to regular foods as tolerated. Avoid greasy, spicy, heavy foods. If nausea and/or vomiting occur, drink only clear liquids until the nausea and/or vomiting subsides. Call your physician if vomiting continues.  Special Instructions/Symptoms: Your throat may feel dry or sore from the anesthesia or the breathing tube placed in your throat during surgery. If this causes discomfort, gargle with warm salt water. The discomfort should disappear within 24 hours.  If you had a scopolamine patch placed behind your ear for the management of post- operative nausea and/or vomiting:  1. The medication in the patch is effective for 72 hours, after which it should be removed.  Wrap patch in a tissue and discard in the trash. Wash hands thoroughly with soap and water. 2. You may remove the patch earlier than 72 hours if you experience unpleasant side effects which may include dry mouth, dizziness or visual disturbances. 3. Avoid touching the patch. Wash your hands with soap and water after contact with the patch.    Regional Anesthesia Blocks  1. Numbness or the inability to move the "blocked" extremity may last from 3-48 hours after placement. The length of time depends on the medication injected and your individual response to the medication. If the numbness is not going away after 48 hours, call your surgeon.  2. The extremity that is blocked will need to be protected until the numbness is gone  and the  Strength has returned. Because you cannot feel it, you will need to take extra care to avoid injury. Because it may be weak, you may have difficulty moving it or using it. You may not know what position it is in without looking at it while the block is in effect.  3. For blocks in the legs and feet, returning to weight bearing and walking needs to be done carefully. You will need to wait until the numbness is entirely gone and the strength has returned. You should be able to move your leg and foot normally before you try and bear weight or walk. You will need someone to be with you when you first try to ensure you do not fall and possibly risk injury.  4. Bruising and tenderness at the needle site are common side effects and will resolve in a few days.  5. Persistent numbness or new problems with movement should be communicated to the surgeon or the [place] [phone] [place] [phone]).  Information for Discharge Teaching: EXPAREL (bupivacaine liposome injectable suspension)   Your surgeon or anesthesiologist gave you EXPAREL(bupivacaine) to help control your pain after surgery.   EXPAREL is a local anesthetic that provides pain relief by numbing the tissue around the surgical site.  EXPAREL is designed to release pain medication over time and can control pain for up to 72 hours.  Depending on how you respond to EXPAREL, you may require less pain medication during your recovery.  Possible side effects:  Temporary loss of sensation or ability to move in the area  where bupivacaine was injected.  Nausea, vomiting, constipation  Rarely, numbness and tingling in your mouth or lips, lightheadedness, or anxiety may occur.  Call your doctor right away if you think you may be experiencing any of these sensations, or if you have other questions regarding possible side effects.  Follow all other discharge instructions given to you by your surgeon  or nurse. Eat a healthy diet and drink plenty of water or other fluids.  If you return to the hospital for any reason within 96 hours following the administration of EXPAREL, it is important for health care providers to know that you have received this anesthetic. A teal colored band has been placed on your arm with the date, time and amount of EXPAREL you have received in order to alert and inform your health care providers. Please leave this armband in place for the full 96 hours following administration, and then you may remove the band.

## 2020-02-18 NOTE — Anesthesia Preprocedure Evaluation (Signed)
Anesthesia Evaluation  Patient identified by MRN, date of birth, ID band Patient awake    History of Anesthesia Complications (+) PONV  Airway Mallampati: II  TM Distance: >3 FB Neck ROM: Full    Dental  (+) Teeth Intact   Pulmonary neg pulmonary ROS,    breath sounds clear to auscultation       Cardiovascular hypertension, Pt. on medications  Rhythm:Regular Rate:Normal     Neuro/Psych  Neuromuscular disease    GI/Hepatic Neg liver ROS, GERD  ,  Endo/Other  negative endocrine ROS  Renal/GU negative Renal ROS     Musculoskeletal  (+) Arthritis , Osteoarthritis,    Abdominal (+) + obese,   Peds  Hematology   Anesthesia Other Findings   Reproductive/Obstetrics                             Anesthesia Physical  Anesthesia Plan  ASA: II  Anesthesia Plan: General   Post-op Pain Management:  Regional for Post-op pain   Induction: Intravenous  PONV Risk Score and Plan: 4 or greater and Ondansetron, Dexamethasone, Midazolam, Droperidol and Treatment may vary due to age or medical condition  Airway Management Planned: Oral ETT  Additional Equipment:   Intra-op Plan:   Post-operative Plan: Extubation in OR  Informed Consent: I have reviewed the patients History and Physical, chart, labs and discussed the procedure including the risks, benefits and alternatives for the proposed anesthesia with the patient or authorized representative who has indicated his/her understanding and acceptance.     Dental advisory given  Plan Discussed with:   Anesthesia Plan Comments:         Anesthesia Quick Evaluation

## 2020-02-19 ENCOUNTER — Encounter (HOSPITAL_BASED_OUTPATIENT_CLINIC_OR_DEPARTMENT_OTHER): Payer: Self-pay | Admitting: Orthopaedic Surgery

## 2020-02-27 DIAGNOSIS — M19011 Primary osteoarthritis, right shoulder: Secondary | ICD-10-CM | POA: Diagnosis not present

## 2020-03-02 DIAGNOSIS — M19011 Primary osteoarthritis, right shoulder: Secondary | ICD-10-CM | POA: Diagnosis not present

## 2020-03-05 DIAGNOSIS — M19011 Primary osteoarthritis, right shoulder: Secondary | ICD-10-CM | POA: Diagnosis not present

## 2020-03-08 DIAGNOSIS — M19011 Primary osteoarthritis, right shoulder: Secondary | ICD-10-CM | POA: Diagnosis not present

## 2020-03-09 DIAGNOSIS — I10 Essential (primary) hypertension: Secondary | ICD-10-CM

## 2020-03-09 MED ORDER — METOPROLOL SUCCINATE ER 25 MG PO TB24: 25.0000 mg | ORAL_TABLET | Freq: Every day | ORAL | 3 refills | Status: DC

## 2020-03-10 DIAGNOSIS — M19011 Primary osteoarthritis, right shoulder: Secondary | ICD-10-CM | POA: Diagnosis not present

## 2020-03-17 DIAGNOSIS — M19011 Primary osteoarthritis, right shoulder: Secondary | ICD-10-CM | POA: Diagnosis not present

## 2020-03-22 DIAGNOSIS — Z9889 Other specified postprocedural states: Secondary | ICD-10-CM | POA: Diagnosis not present

## 2020-03-22 DIAGNOSIS — Z4789 Encounter for other orthopedic aftercare: Secondary | ICD-10-CM | POA: Diagnosis not present

## 2020-03-22 DIAGNOSIS — M25511 Pain in right shoulder: Secondary | ICD-10-CM | POA: Diagnosis not present

## 2020-03-22 DIAGNOSIS — M6281 Muscle weakness (generalized): Secondary | ICD-10-CM | POA: Diagnosis not present

## 2020-03-23 DIAGNOSIS — M19011 Primary osteoarthritis, right shoulder: Secondary | ICD-10-CM | POA: Diagnosis not present

## 2020-03-24 DIAGNOSIS — Z4789 Encounter for other orthopedic aftercare: Secondary | ICD-10-CM | POA: Diagnosis not present

## 2020-03-24 DIAGNOSIS — Z9889 Other specified postprocedural states: Secondary | ICD-10-CM | POA: Diagnosis not present

## 2020-03-24 DIAGNOSIS — M25511 Pain in right shoulder: Secondary | ICD-10-CM | POA: Diagnosis not present

## 2020-03-24 DIAGNOSIS — M6281 Muscle weakness (generalized): Secondary | ICD-10-CM | POA: Diagnosis not present

## 2020-03-29 DIAGNOSIS — M6281 Muscle weakness (generalized): Secondary | ICD-10-CM | POA: Diagnosis not present

## 2020-03-29 DIAGNOSIS — M25511 Pain in right shoulder: Secondary | ICD-10-CM | POA: Diagnosis not present

## 2020-03-29 DIAGNOSIS — Z9889 Other specified postprocedural states: Secondary | ICD-10-CM | POA: Diagnosis not present

## 2020-03-29 DIAGNOSIS — Z4789 Encounter for other orthopedic aftercare: Secondary | ICD-10-CM | POA: Diagnosis not present

## 2020-03-31 DIAGNOSIS — Z4789 Encounter for other orthopedic aftercare: Secondary | ICD-10-CM | POA: Diagnosis not present

## 2020-03-31 DIAGNOSIS — M6281 Muscle weakness (generalized): Secondary | ICD-10-CM | POA: Diagnosis not present

## 2020-03-31 DIAGNOSIS — Z9889 Other specified postprocedural states: Secondary | ICD-10-CM | POA: Diagnosis not present

## 2020-03-31 DIAGNOSIS — M25511 Pain in right shoulder: Secondary | ICD-10-CM | POA: Diagnosis not present

## 2020-04-07 DIAGNOSIS — M6281 Muscle weakness (generalized): Secondary | ICD-10-CM | POA: Diagnosis not present

## 2020-04-07 DIAGNOSIS — Z9889 Other specified postprocedural states: Secondary | ICD-10-CM | POA: Diagnosis not present

## 2020-04-07 DIAGNOSIS — M25511 Pain in right shoulder: Secondary | ICD-10-CM | POA: Diagnosis not present

## 2020-04-07 DIAGNOSIS — Z4789 Encounter for other orthopedic aftercare: Secondary | ICD-10-CM | POA: Diagnosis not present

## 2020-04-14 DIAGNOSIS — Z4789 Encounter for other orthopedic aftercare: Secondary | ICD-10-CM | POA: Diagnosis not present

## 2020-04-14 DIAGNOSIS — Z9889 Other specified postprocedural states: Secondary | ICD-10-CM | POA: Diagnosis not present

## 2020-04-14 DIAGNOSIS — M25511 Pain in right shoulder: Secondary | ICD-10-CM | POA: Diagnosis not present

## 2020-04-14 DIAGNOSIS — M6281 Muscle weakness (generalized): Secondary | ICD-10-CM | POA: Diagnosis not present

## 2020-04-21 DIAGNOSIS — M6281 Muscle weakness (generalized): Secondary | ICD-10-CM | POA: Diagnosis not present

## 2020-04-21 DIAGNOSIS — M25511 Pain in right shoulder: Secondary | ICD-10-CM | POA: Diagnosis not present

## 2020-04-28 DIAGNOSIS — M25511 Pain in right shoulder: Secondary | ICD-10-CM | POA: Diagnosis not present

## 2020-04-28 DIAGNOSIS — M6281 Muscle weakness (generalized): Secondary | ICD-10-CM | POA: Diagnosis not present

## 2020-05-12 DIAGNOSIS — M25511 Pain in right shoulder: Secondary | ICD-10-CM | POA: Diagnosis not present

## 2020-05-12 DIAGNOSIS — M6281 Muscle weakness (generalized): Secondary | ICD-10-CM | POA: Diagnosis not present

## 2020-05-17 ENCOUNTER — Other Ambulatory Visit: Payer: Self-pay | Admitting: Sports Medicine

## 2020-05-17 DIAGNOSIS — I10 Essential (primary) hypertension: Secondary | ICD-10-CM

## 2020-05-17 MED ORDER — PRAVASTATIN SODIUM 20 MG PO TABS: 20.0000 mg | ORAL_TABLET | Freq: Every day | ORAL | 3 refills | Status: DC

## 2020-05-19 DIAGNOSIS — M19011 Primary osteoarthritis, right shoulder: Secondary | ICD-10-CM | POA: Diagnosis not present

## 2020-05-21 DIAGNOSIS — M19011 Primary osteoarthritis, right shoulder: Secondary | ICD-10-CM | POA: Diagnosis not present

## 2020-05-26 DIAGNOSIS — M19011 Primary osteoarthritis, right shoulder: Secondary | ICD-10-CM | POA: Diagnosis not present

## 2020-05-28 DIAGNOSIS — Z01419 Encounter for gynecological examination (general) (routine) without abnormal findings: Secondary | ICD-10-CM | POA: Diagnosis not present

## 2020-05-28 DIAGNOSIS — Z6831 Body mass index (BMI) 31.0-31.9, adult: Secondary | ICD-10-CM | POA: Diagnosis not present

## 2020-05-31 DIAGNOSIS — D123 Benign neoplasm of transverse colon: Secondary | ICD-10-CM | POA: Diagnosis not present

## 2020-05-31 DIAGNOSIS — K635 Polyp of colon: Secondary | ICD-10-CM | POA: Diagnosis not present

## 2020-05-31 DIAGNOSIS — Z86010 Personal history of colonic polyps: Secondary | ICD-10-CM | POA: Diagnosis not present

## 2020-05-31 DIAGNOSIS — Z1211 Encounter for screening for malignant neoplasm of colon: Secondary | ICD-10-CM | POA: Diagnosis not present

## 2020-05-31 LAB — HM COLONOSCOPY

## 2020-06-01 DIAGNOSIS — M19011 Primary osteoarthritis, right shoulder: Secondary | ICD-10-CM | POA: Diagnosis not present

## 2020-06-02 ENCOUNTER — Other Ambulatory Visit: Payer: Self-pay

## 2020-06-02 ENCOUNTER — Ambulatory Visit (INDEPENDENT_AMBULATORY_CARE_PROVIDER_SITE_OTHER): Payer: Medicare HMO

## 2020-06-02 ENCOUNTER — Ambulatory Visit (INDEPENDENT_AMBULATORY_CARE_PROVIDER_SITE_OTHER): Payer: Medicare HMO | Admitting: Sports Medicine

## 2020-06-02 DIAGNOSIS — I10 Essential (primary) hypertension: Secondary | ICD-10-CM

## 2020-06-02 DIAGNOSIS — M47816 Spondylosis without myelopathy or radiculopathy, lumbar region: Secondary | ICD-10-CM | POA: Diagnosis not present

## 2020-06-02 MED ORDER — MELOXICAM 15 MG PO TABS: 15.0000 mg | ORAL_TABLET | Freq: Every day | ORAL | 3 refills | Status: DC

## 2020-06-02 NOTE — Addendum Note (Signed)
Addended by: Silverio Decamp on: 06/02/2020 01:00 PM   Modules accepted: Orders

## 2020-06-02 NOTE — Assessment & Plan Note (Signed)
Last left sacroiliac joint injection was in November last year, repeated today. Return as needed.

## 2020-06-02 NOTE — Progress Notes (Signed)
    Procedures performed today:    Procedure: Real-time Ultrasound Guided injection of the left sacroiliac joint Device: Samsung HS60  Verbal informed consent obtained.  Time-out conducted.  Noted no overlying erythema, induration, or other signs of local infection.  Skin prepped in a sterile fashion.  Local anesthesia: Topical Ethyl chloride.  With sterile technique and under real time ultrasound guidance:  Noted minimally arthritic sacroiliac joint, 1 cc Kenalog 40, 2 cc lidocaine, 2 cc bupivacaine injected easily Completed without difficulty  Advised to call if fevers/chills, erythema, induration, drainage, or persistent bleeding.  Images permanently stored and available for review in PACS.  Impression: Technically successful ultrasound guided injection.  Independent interpretation of notes and tests performed by another provider:   None.  Brief History, Exam, Impression, and Recommendations:    Lumbar spondylosis with sacroiliac joint dysfunction Last left sacroiliac joint injection was in November last year, repeated today. Return as needed.    ___________________________________________ Gwen Her. Dianah Field, M.D., ABFM., CAQSM. Primary Care and [place] Instructor of [place] of [facility] of Medicine

## 2020-06-07 DIAGNOSIS — I10 Essential (primary) hypertension: Secondary | ICD-10-CM | POA: Diagnosis not present

## 2020-06-08 ENCOUNTER — Encounter: Payer: Self-pay | Admitting: Sports Medicine

## 2020-06-08 DIAGNOSIS — I10 Essential (primary) hypertension: Secondary | ICD-10-CM

## 2020-06-08 DIAGNOSIS — M19011 Primary osteoarthritis, right shoulder: Secondary | ICD-10-CM | POA: Diagnosis not present

## 2020-06-08 LAB — CBC
HCT: 47.6 % — ABNORMAL HIGH (ref 35.0–45.0)
Hemoglobin: 16.1 g/dL — ABNORMAL HIGH (ref 11.7–15.5)
MCH: 32.8 pg (ref 27.0–33.0)
MCHC: 33.8 g/dL (ref 32.0–36.0)
MCV: 96.9 fL (ref 80.0–100.0)
MPV: 10 fL (ref 7.5–12.5)
Platelets: 289 10*3/uL (ref 140–400)
RBC: 4.91 10*6/uL (ref 3.80–5.10)
RDW: 11.5 % (ref 11.0–15.0)
WBC: 6.3 10*3/uL (ref 3.8–10.8)

## 2020-06-08 LAB — COMPREHENSIVE METABOLIC PANEL
AG Ratio: 1.7 (calc) (ref 1.0–2.5)
ALT: 44 U/L — ABNORMAL HIGH (ref 6–29)
AST: 28 U/L (ref 10–35)
Albumin: 4.5 g/dL (ref 3.6–5.1)
Alkaline phosphatase (APISO): 70 U/L (ref 37–153)
BUN: 15 mg/dL (ref 7–25)
CO2: 27 mmol/L (ref 20–32)
Calcium: 10.2 mg/dL (ref 8.6–10.4)
Chloride: 104 mmol/L (ref 98–110)
Creat: 0.87 mg/dL (ref 0.50–0.99)
Globulin: 2.6 g/dL (calc) (ref 1.9–3.7)
Glucose, Bld: 91 mg/dL (ref 65–99)
Potassium: 4.6 mmol/L (ref 3.5–5.3)
Sodium: 141 mmol/L (ref 135–146)
Total Bilirubin: 0.5 mg/dL (ref 0.2–1.2)
Total Protein: 7.1 g/dL (ref 6.1–8.1)

## 2020-06-08 LAB — LIPID PANEL
Cholesterol: 214 mg/dL — ABNORMAL HIGH (ref <200)
HDL: 57 mg/dL (ref >=50)
LDL Cholesterol (Calc): 132 mg/dL (calc) — ABNORMAL HIGH
Non-HDL Cholesterol (Calc): 157 mg/dL (calc) — ABNORMAL HIGH (ref <130)
Total CHOL/HDL Ratio: 3.8 (calc) (ref <5.0)
Triglycerides: 142 mg/dL (ref <150)

## 2020-06-08 LAB — TSH: TSH: 1.53 mIU/L (ref 0.40–4.50)

## 2020-06-09 ENCOUNTER — Other Ambulatory Visit: Payer: Self-pay | Admitting: *Deleted

## 2020-06-09 DIAGNOSIS — I10 Essential (primary) hypertension: Secondary | ICD-10-CM

## 2020-06-09 MED ORDER — PRAVASTATIN SODIUM 40 MG PO TABS: 40.0000 mg | ORAL_TABLET | Freq: Every day | ORAL | 3 refills | Status: DC

## 2020-06-15 DIAGNOSIS — M19011 Primary osteoarthritis, right shoulder: Secondary | ICD-10-CM | POA: Diagnosis not present

## 2020-06-22 DIAGNOSIS — Z96611 Presence of right artificial shoulder joint: Secondary | ICD-10-CM | POA: Diagnosis not present

## 2020-06-22 DIAGNOSIS — M25511 Pain in right shoulder: Secondary | ICD-10-CM | POA: Diagnosis not present

## 2020-06-22 DIAGNOSIS — M19011 Primary osteoarthritis, right shoulder: Secondary | ICD-10-CM | POA: Diagnosis not present

## 2020-07-08 DIAGNOSIS — M170 Bilateral primary osteoarthritis of knee: Secondary | ICD-10-CM

## 2020-07-08 NOTE — Telephone Encounter (Signed)
Looks like the last time I treated her for her knees was a year ago, we were using Euflexxa, go ahead and get her approved again bilaterally, I suspect we will have to just use Euflexxa again.

## 2020-07-08 NOTE — Telephone Encounter (Signed)
[name]    Dr. Darene Lamer did not send me anything for this patient I don't even see in the chart where he has been treating her for her knees.  [name]

## 2020-07-09 MED ORDER — EUFLEXXA 20 MG/2ML IX SOSY: 1.0000 | PREFILLED_SYRINGE | INTRA_ARTICULAR | 3 refills | Status: DC

## 2020-07-09 NOTE — Telephone Encounter (Signed)
Lets go ahead and just use Euflexxa, I will send it to the specialty pharmacy, we used [facility] last time

## 2020-07-09 NOTE — Telephone Encounter (Signed)
Are we going to try Orthovisc or just do Euflexxa? If you just want to stick with Euflexxa go ahead and send it to her specialty pharmacy which looks like it would be [facility] and if they require a PA they will send it to me.

## 2020-07-16 ENCOUNTER — Telehealth: Payer: Self-pay

## 2020-07-16 NOTE — Telephone Encounter (Signed)
[facility] called stating we needed to do a PA on Euflexxa and gave the following contact info:  p- [phone] f- [phone]

## 2020-07-16 NOTE — Telephone Encounter (Signed)
Thank you for getting the numbers they keep faxing a form for Cover my meds but when I did it on there they said I had to call and I don't remember it giving me the number to call to do the PA. I will get the PA done so hopefully they will approve it.    Jenny Reichmann

## 2020-08-09 NOTE — Telephone Encounter (Signed)
Euflexxa was a non preferred I sent her information through Orthovisc and got approval.   Auth: [id number] Valid 08/05/2020 - 11/05/2020  Patient is responsible for a 25.00 co pay at each visit and will be responsible for 20% of the medication cost until her out of pocket is met then she is covered at 100%  Patient is scheduled starting this Thursday 08/12/2020-CF

## 2020-08-11 ENCOUNTER — Other Ambulatory Visit: Payer: Self-pay | Admitting: Sports Medicine

## 2020-08-12 ENCOUNTER — Ambulatory Visit (INDEPENDENT_AMBULATORY_CARE_PROVIDER_SITE_OTHER): Payer: Medicare HMO

## 2020-08-12 ENCOUNTER — Other Ambulatory Visit: Payer: Self-pay

## 2020-08-12 ENCOUNTER — Ambulatory Visit (INDEPENDENT_AMBULATORY_CARE_PROVIDER_SITE_OTHER): Payer: Medicare HMO | Admitting: Sports Medicine

## 2020-08-12 DIAGNOSIS — M170 Bilateral primary osteoarthritis of knee: Secondary | ICD-10-CM

## 2020-08-12 NOTE — Progress Notes (Signed)
    Procedures performed today:    None.  Independent interpretation of notes and tests performed by another provider:   Procedure: Real-time Ultrasound Guided injection of the left knee Device: Samsung HS60  Verbal informed consent obtained.  Time-out conducted.  Noted no overlying erythema, induration, or other signs of local infection.  Skin prepped in a sterile fashion.  Local anesthesia: Topical Ethyl chloride.  With sterile technique and under real time ultrasound guidance:  Noted mild effusion, 30 mg/2 mL of OrthoVisc (sodium hyaluronate) in a prefilled syringe was injected easily into the knee through a 22-gauge needle. Completed without difficulty  Advised to call if fevers/chills, erythema, induration, drainage, or persistent bleeding.  Images permanently stored and available for review in PACS.  Impression: Technically successful ultrasound guided injection.  Procedure: Real-time Ultrasound Guided injection of the right knee Device: Samsung HS60  Verbal informed consent obtained.  Time-out conducted.  Noted no overlying erythema, induration, or other signs of local infection.  Skin prepped in a sterile fashion.  Local anesthesia: Topical Ethyl chloride.  With sterile technique and under real time ultrasound guidance:  Noted mild effusion, 30 mg/2 mL of OrthoVisc (sodium hyaluronate) in a prefilled syringe was injected easily into the knee through a 22-gauge needle. Completed without difficulty  Advised to call if fevers/chills, erythema, induration, drainage, or persistent bleeding.  Images permanently stored and available for review in PACS.  Impression: Technically successful ultrasound guided injection.  Brief History, Exam, Impression, and Recommendations:    Primary osteoarthritis of both knees [name] returns, she is a pleasant 70 year old female, she did excellent with Orthovisc approximately 1 year ago, her pain came back about 2 months ago, we started the  series again today bilaterally, return in 1 week for Orthovisc No. 2 of 4 both knees.    ___________________________________________ Gwen Her. Dianah Field, M.D., ABFM., CAQSM. Primary Care and [place] Instructor of [place] of [facility] of Medicine

## 2020-08-12 NOTE — Assessment & Plan Note (Signed)
[name] returns, she is a pleasant 70 year old female, she did excellent with Orthovisc approximately 1 year ago, her pain came back about 2 months ago, we started the series again today bilaterally, return in 1 week for Orthovisc No. 2 of 4 both knees.

## 2020-08-19 ENCOUNTER — Other Ambulatory Visit: Payer: Self-pay

## 2020-08-19 ENCOUNTER — Ambulatory Visit (INDEPENDENT_AMBULATORY_CARE_PROVIDER_SITE_OTHER): Payer: Medicare HMO | Admitting: Sports Medicine

## 2020-08-19 ENCOUNTER — Ambulatory Visit (INDEPENDENT_AMBULATORY_CARE_PROVIDER_SITE_OTHER): Payer: Medicare HMO

## 2020-08-19 DIAGNOSIS — M170 Bilateral primary osteoarthritis of knee: Secondary | ICD-10-CM

## 2020-08-19 NOTE — Assessment & Plan Note (Signed)
Is a pleasant 70 year old female with knee osteoarthritis, did well a year ago with Orthovisc, today we did Orthovisc No. 2 of 4 both knees. Return in 1 week for Orthovisc No. 3 of 4 both knees.

## 2020-08-19 NOTE — Progress Notes (Signed)
    Procedures performed today:    Procedure: Real-time Ultrasound Guidedinjection of the left knee Device: Samsung HS60  Verbal informed consent obtained.  Time-out conducted.  Noted no overlying erythema, induration, or other signs of local infection.  Skin prepped in a sterile fashion.  Local anesthesia: Topical Ethyl chloride.  With sterile technique and under real time ultrasound guidance: Noted mild effusion, 30 mg/2 mL of OrthoVisc (sodium hyaluronate) in a prefilled syringe was injected easily into the knee through a 22-gauge needle. Completed without difficulty  Advised to call if fevers/chills, erythema, induration, drainage, or persistent bleeding.  Images permanently stored and available for review in PACS.  Impression: Technically successful ultrasound guided injection.  Procedure: Real-time Ultrasound Guidedinjection of the right knee Device: Samsung HS60  Verbal informed consent obtained.  Time-out conducted.  Noted no overlying erythema, induration, or other signs of local infection.  Skin prepped in a sterile fashion.  Local anesthesia: Topical Ethyl chloride.  With sterile technique and under real time ultrasound guidance: Noted mild effusion, 30 mg/2 mL of OrthoVisc (sodium hyaluronate) in a prefilled syringe was injected easily into the knee through a 22-gauge needle. Completed without difficulty  Advised to call if fevers/chills, erythema, induration, drainage, or persistent bleeding.  Images permanently stored and available for review in PACS.  Impression: Technically successful ultrasound guided injection.  Independent interpretation of notes and tests performed by another provider:   None.  Brief History, Exam, Impression, and Recommendations:    Primary osteoarthritis of both knees Is a pleasant 70 year old female with knee osteoarthritis, did well a year ago with Orthovisc, today we did Orthovisc No. 2 of 4 both knees. Return in 1 week for  Orthovisc No. 3 of 4 both knees.    ___________________________________________ Gwen Her. Dianah Field, M.D., ABFM., CAQSM. Primary Care and [place] Instructor of [place] of [facility] of Medicine

## 2020-08-26 ENCOUNTER — Other Ambulatory Visit: Payer: Self-pay

## 2020-08-26 ENCOUNTER — Ambulatory Visit (INDEPENDENT_AMBULATORY_CARE_PROVIDER_SITE_OTHER): Payer: Medicare HMO | Admitting: Sports Medicine

## 2020-08-26 ENCOUNTER — Ambulatory Visit (INDEPENDENT_AMBULATORY_CARE_PROVIDER_SITE_OTHER): Payer: Medicare HMO

## 2020-08-26 DIAGNOSIS — M170 Bilateral primary osteoarthritis of knee: Secondary | ICD-10-CM

## 2020-08-26 DIAGNOSIS — E782 Mixed hyperlipidemia: Secondary | ICD-10-CM

## 2020-08-26 NOTE — Assessment & Plan Note (Signed)
This pleasant 70 year old female returns, known knee osteoarthritis, did well with Orthovisc a year ago, today we did Orthovisc No. 3 of 4 into both knees, return in 1 week for #4 of 4.

## 2020-08-26 NOTE — Progress Notes (Signed)
    Procedures performed today:    Procedure: Real-time Ultrasound Guided injection of the left knee Device: Samsung HS60 Verbal informed consent obtained. Time-out conducted. Noted no overlying erythema, induration, or other signs of local infection. Skin prepped in a sterile fashion. Local anesthesia: Topical Ethyl chloride. With sterile technique and under real time ultrasound guidance:  Noted mild effusion, 30 mg/2 mL of OrthoVisc (sodium hyaluronate) in a prefilled syringe was injected easily into the knee through a 22-gauge needle. Completed without difficulty Advised to call if fevers/chills, erythema, induration, drainage, or persistent bleeding. Images permanently stored and available for review in PACS. Impression: Technically successful ultrasound guided injection.   Procedure: Real-time Ultrasound Guided injection of the right knee Device: Samsung HS60 Verbal informed consent obtained. Time-out conducted. Noted no overlying erythema, induration, or other signs of local infection. Skin prepped in a sterile fashion. Local anesthesia: Topical Ethyl chloride. With sterile technique and under real time ultrasound guidance:  Noted mild effusion, 30 mg/2 mL of OrthoVisc (sodium hyaluronate) in a prefilled syringe was injected easily into the knee through a 22-gauge needle. Completed without difficulty Advised to call if fevers/chills, erythema, induration, drainage, or persistent bleeding. Images permanently stored and available for review in PACS. Impression: Technically successful ultrasound guided injection.  Independent interpretation of notes and tests performed by another provider:   None.  Brief History, Exam, Impression, and Recommendations:    Primary osteoarthritis of both knees This pleasant 70 year old female returns, known knee osteoarthritis, did well with Orthovisc a year ago, today we did Orthovisc No. 3 of 4 into both knees, return in 1 week for #4 of  4.  Hyperlipidemia We recently increased [name]'s pravastatin, 2 and a half months ago, she will return next week for fasting lipids.    ___________________________________________ Gwen Her. Dianah Field, M.D., ABFM., CAQSM. Primary Care and [place] Instructor of [place] of [facility] of Medicine

## 2020-08-26 NOTE — Assessment & Plan Note (Addendum)
We recently increased [name]'s pravastatin, 2 and a half months ago, she will return next week for fasting lipids.

## 2020-09-02 ENCOUNTER — Ambulatory Visit (INDEPENDENT_AMBULATORY_CARE_PROVIDER_SITE_OTHER): Payer: Medicare HMO | Admitting: Sports Medicine

## 2020-09-02 ENCOUNTER — Other Ambulatory Visit: Payer: Self-pay

## 2020-09-02 ENCOUNTER — Ambulatory Visit (INDEPENDENT_AMBULATORY_CARE_PROVIDER_SITE_OTHER): Payer: Medicare HMO

## 2020-09-02 DIAGNOSIS — I10 Essential (primary) hypertension: Secondary | ICD-10-CM | POA: Diagnosis not present

## 2020-09-02 DIAGNOSIS — M170 Bilateral primary osteoarthritis of knee: Secondary | ICD-10-CM | POA: Diagnosis not present

## 2020-09-02 LAB — COMPREHENSIVE METABOLIC PANEL
AG Ratio: 1.7 (calc) (ref 1.0–2.5)
ALT: 21 U/L (ref 6–29)
AST: 23 U/L (ref 10–35)
Albumin: 4.2 g/dL (ref 3.6–5.1)
Alkaline phosphatase (APISO): 71 U/L (ref 37–153)
BUN: 15 mg/dL (ref 7–25)
CO2: 28 mmol/L (ref 20–32)
Calcium: 10 mg/dL (ref 8.6–10.4)
Chloride: 104 mmol/L (ref 98–110)
Creat: 0.84 mg/dL (ref 0.50–0.99)
Globulin: 2.5 g/dL (calc) (ref 1.9–3.7)
Glucose, Bld: 87 mg/dL (ref 65–99)
Potassium: 4.4 mmol/L (ref 3.5–5.3)
Sodium: 139 mmol/L (ref 135–146)
Total Bilirubin: 0.8 mg/dL (ref 0.2–1.2)
Total Protein: 6.7 g/dL (ref 6.1–8.1)

## 2020-09-02 LAB — LIPID PANEL
Cholesterol: 168 mg/dL (ref <200)
HDL: 54 mg/dL (ref >=50)
LDL Cholesterol (Calc): 92 mg/dL (calc)
Non-HDL Cholesterol (Calc): 114 mg/dL (calc) (ref <130)
Total CHOL/HDL Ratio: 3.1 (calc) (ref <5.0)
Triglycerides: 121 mg/dL (ref <150)

## 2020-09-02 NOTE — Assessment & Plan Note (Signed)
[name] returns, she is a pleasant 70 year old female with known bilateral knee osteoarthritis, did well with Orthovisc a year ago, today we are doing Orthovisc No. 4 of 4 both knees, return as needed.

## 2020-09-02 NOTE — Progress Notes (Signed)
    Procedures performed today:    Procedure: Real-time Ultrasound Guided injection of the left knee Device: Samsung HS60 Verbal informed consent obtained. Time-out conducted. Noted no overlying erythema, induration, or other signs of local infection. Skin prepped in a sterile fashion. Local anesthesia: Topical Ethyl chloride. With sterile technique and under real time ultrasound guidance:  Noted mild effusion, 30 mg/2 mL of OrthoVisc (sodium hyaluronate) in a prefilled syringe was injected easily into the knee through a 22-gauge needle. Completed without difficulty Advised to call if fevers/chills, erythema, induration, drainage, or persistent bleeding. Images permanently stored and available for review in PACS. Impression: Technically successful ultrasound guided injection.   Procedure: Real-time Ultrasound Guided injection of the right knee Device: Samsung HS60 Verbal informed consent obtained. Time-out conducted. Noted no overlying erythema, induration, or other signs of local infection. Skin prepped in a sterile fashion. Local anesthesia: Topical Ethyl chloride. With sterile technique and under real time ultrasound guidance:  Noted mild effusion, 30 mg/2 mL of OrthoVisc (sodium hyaluronate) in a prefilled syringe was injected easily into the knee through a 22-gauge needle. Completed without difficulty Advised to call if fevers/chills, erythema, induration, drainage, or persistent bleeding. Images permanently stored and available for review in PACS. Impression: Technically successful ultrasound guided injection.  Independent interpretation of notes and tests performed by another provider:   None.  Brief History, Exam, Impression, and Recommendations:    Primary osteoarthritis of both knees Neoma Laming returns, she is a pleasant 70 year old female with known bilateral knee osteoarthritis, did well with Orthovisc a year ago, today we are doing Orthovisc No. 4 of 4 both knees,  return as needed.    ___________________________________________ Gwen Her. Dianah Field, M.D., ABFM., CAQSM. Primary Care and [place] Instructor of [place] of [facility] of Medicine

## 2020-10-28 ENCOUNTER — Other Ambulatory Visit: Payer: Self-pay

## 2020-10-28 ENCOUNTER — Ambulatory Visit (INDEPENDENT_AMBULATORY_CARE_PROVIDER_SITE_OTHER): Payer: Medicare HMO

## 2020-10-28 DIAGNOSIS — Z1231 Encounter for screening mammogram for malignant neoplasm of breast: Secondary | ICD-10-CM | POA: Diagnosis not present

## 2020-12-28 ENCOUNTER — Ambulatory Visit (INDEPENDENT_AMBULATORY_CARE_PROVIDER_SITE_OTHER): Payer: Medicare HMO | Admitting: Sports Medicine

## 2020-12-28 DIAGNOSIS — G5601 Carpal tunnel syndrome, right upper limb: Secondary | ICD-10-CM | POA: Diagnosis not present

## 2020-12-28 DIAGNOSIS — L821 Other seborrheic keratosis: Secondary | ICD-10-CM

## 2020-12-28 NOTE — Assessment & Plan Note (Signed)
Cryotherapy of a seborrheic keratosis on the left posterior shoulder and right chest wall. I also performed cryotherapy of a skin lesion on the left side of the nose that felt/looked like an actinic keratosis.

## 2020-12-28 NOTE — Assessment & Plan Note (Signed)
[name] returns, she is a pleasant 69 year old female, for several months she has had numbness and tingling, pain in her right hand mostly at night and in the morning, on exam she has a positive Tinel's sign. We discussed the pathophysiology, we will start with nighttime splinting, conditioning exercises, return to see me in 6 weeks if not better and we can proceed with Hydro dissection.

## 2020-12-28 NOTE — Progress Notes (Signed)
    Procedures performed today:    Procedure:  Cryodestruction of left posterior shoulder, right chest wall seborrheic keratoses and a left-sided nose actinic keratosis Consent obtained and verified. Time-out conducted. Noted no overlying erythema, induration, or other signs of local infection. Completed without difficulty using Cryo-Gun. Advised to call if fevers/chills, erythema, induration, drainage, or persistent bleeding.  Independent interpretation of notes and tests performed by another provider:   None.  Brief History, Exam, Impression, and Recommendations:    Carpal tunnel syndrome on right [name] returns, she is a pleasant 70 year old female, for several months she has had numbness and tingling, pain in her right hand mostly at night and in the morning, on exam she has a positive Tinel's sign. We discussed the pathophysiology, we will start with nighttime splinting, conditioning exercises, return to see me in 6 weeks if not better and we can proceed with Hydro dissection.  Seborrheic keratosis Cryotherapy of a seborrheic keratosis on the left posterior shoulder and right chest wall. I also performed cryotherapy of a skin lesion on the left side of the nose that felt/looked like an actinic keratosis.    ___________________________________________ Gwen Her. Dianah Field, M.D., ABFM., CAQSM. Primary Care and [place] Instructor of [place] of [facility] of Medicine

## 2021-01-20 ENCOUNTER — Other Ambulatory Visit: Payer: Self-pay

## 2021-01-20 MED ORDER — TIZANIDINE HCL 4 MG PO TABS
ORAL_TABLET | ORAL | 0 refills | Status: DC
Start: 2021-01-20 — End: 2021-04-07

## 2021-01-20 NOTE — Telephone Encounter (Signed)
Received fax from pharmacy for refill.

## 2021-01-29 ENCOUNTER — Other Ambulatory Visit: Payer: Self-pay | Admitting: Sports Medicine

## 2021-01-29 DIAGNOSIS — I10 Essential (primary) hypertension: Secondary | ICD-10-CM

## 2021-02-07 ENCOUNTER — Other Ambulatory Visit: Payer: Self-pay | Admitting: Sports Medicine

## 2021-02-08 ENCOUNTER — Ambulatory Visit (INDEPENDENT_AMBULATORY_CARE_PROVIDER_SITE_OTHER): Payer: Medicare HMO | Admitting: Sports Medicine

## 2021-02-08 ENCOUNTER — Other Ambulatory Visit: Payer: Self-pay

## 2021-02-08 DIAGNOSIS — G5601 Carpal tunnel syndrome, right upper limb: Secondary | ICD-10-CM | POA: Diagnosis not present

## 2021-02-08 DIAGNOSIS — L821 Other seborrheic keratosis: Secondary | ICD-10-CM | POA: Diagnosis not present

## 2021-02-08 NOTE — Progress Notes (Signed)
    Procedures performed today:    Procedure:  Cryodestruction of right chest wall seborrheic keratosis Consent obtained and verified. Time-out conducted. Noted no overlying erythema, induration, or other signs of local infection. Completed without difficulty using Cryo-Gun. Advised to call if fevers/chills, erythema, induration, drainage, or persistent bleeding.  Independent interpretation of notes and tests performed by another provider:   None.  Brief History, Exam, Impression, and Recommendations:    Carpal tunnel syndrome on right Symptoms improved to some degree with nighttime splinting, conditioning exercises but not completely, symptoms are tolerable at this time so we will hold off on hydrodissection, return as needed.  Seborrheic keratosis Seborrheic keratosis on the left posterior shoulder has resolved, right chest wall SK is still partially present so I did repeat cryotherapy today, return as needed.    ___________________________________________ Gwen Her. Dianah Field, M.D., ABFM., CAQSM. Primary Care and [place] Instructor of [place] of [facility] of Medicine

## 2021-02-08 NOTE — Assessment & Plan Note (Signed)
Seborrheic keratosis on the left posterior shoulder has resolved, right chest wall SK is still partially present so I did repeat cryotherapy today, return as needed.

## 2021-02-08 NOTE — Assessment & Plan Note (Signed)
Symptoms improved to some degree with nighttime splinting, conditioning exercises but not completely, symptoms are tolerable at this time so we will hold off on hydrodissection, return as needed.

## 2021-02-18 DIAGNOSIS — M19011 Primary osteoarthritis, right shoulder: Secondary | ICD-10-CM | POA: Diagnosis not present

## 2021-02-22 DIAGNOSIS — H524 Presbyopia: Secondary | ICD-10-CM | POA: Diagnosis not present

## 2021-02-22 DIAGNOSIS — H5203 Hypermetropia, bilateral: Secondary | ICD-10-CM | POA: Diagnosis not present

## 2021-02-22 DIAGNOSIS — H52203 Unspecified astigmatism, bilateral: Secondary | ICD-10-CM | POA: Diagnosis not present

## 2021-04-06 ENCOUNTER — Other Ambulatory Visit: Payer: Self-pay | Admitting: Sports Medicine

## 2021-04-06 DIAGNOSIS — I10 Essential (primary) hypertension: Secondary | ICD-10-CM

## 2021-06-01 DIAGNOSIS — Z6830 Body mass index (BMI) 30.0-30.9, adult: Secondary | ICD-10-CM | POA: Diagnosis not present

## 2021-06-01 DIAGNOSIS — Z01419 Encounter for gynecological examination (general) (routine) without abnormal findings: Secondary | ICD-10-CM | POA: Diagnosis not present

## 2021-06-01 DIAGNOSIS — Z1272 Encounter for screening for malignant neoplasm of vagina: Secondary | ICD-10-CM | POA: Diagnosis not present

## 2021-06-22 ENCOUNTER — Other Ambulatory Visit: Payer: Self-pay | Admitting: Sports Medicine

## 2021-06-22 ENCOUNTER — Ambulatory Visit (INDEPENDENT_AMBULATORY_CARE_PROVIDER_SITE_OTHER): Payer: Medicare HMO | Admitting: Medical-Surgical

## 2021-06-22 DIAGNOSIS — Z0000 Encounter for general adult medical examination without abnormal findings: Secondary | ICD-10-CM | POA: Diagnosis not present

## 2021-06-22 DIAGNOSIS — Z780 Asymptomatic menopausal state: Secondary | ICD-10-CM | POA: Diagnosis not present

## 2021-06-22 NOTE — Patient Instructions (Addendum)
?MEDICARE ANNUAL WELLNESS VISIT ?Health Maintenance Summary and Written Plan of Care ? ?Ms. Sheral Flow , ? ?Thank you for allowing me to perform your Medicare Annual Wellness Visit and for your ongoing commitment to your health.  ? ?Health Maintenance & Immunization History ?Health Maintenance  ?Topic Date Due  ? COVID-19 Vaccine (3 - Pfizer risk series) 07/08/2021 (Originally 01/24/2021)  ? Zoster Vaccines- Shingrix (1 of 2) 09/21/2021 (Originally 08/12/1969)  ? Pneumonia Vaccine 69 Years old (2 - PCV) 06/23/2022 (Originally 04/07/2020)  ? TETANUS/TDAP  06/23/2022 (Originally 03/21/2019)  ? INFLUENZA VACCINE  10/18/2021  ? MAMMOGRAM  10/29/2022  ? COLONOSCOPY (Pts 45-57yr Insurance coverage will need to be confirmed)  06/01/2023  ? DEXA SCAN  Completed  ? Hepatitis C Screening  Completed  ? HPV VACCINES  Aged Out  ? ?Immunization History  ?Administered Date(s) Administered  ? Fluad Quad(high Dose 69+) 12/31/2018  ? H1N1 04/22/2008  ? Influenza Whole 12/19/2011  ? Influenza, High Dose Seasonal PF 01/04/2018  ? Influenza-Unspecified 12/19/2014, 12/19/2015, 12/18/2016, 01/26/2021  ? Moderna Covid-19 Vaccine Bivalent Booster 117yr& up 01/24/2021  ? PFIZER(Purple Top)SARS-COV-2 Vaccination 05/22/2019, 06/18/2019  ? Pneumococcal Polysaccharide-23 01/04/2018, 04/08/2019  ? Td 01/25/2005  ? Zoster, Live 05/09/2010  ? ? ?These are the patient goals that we discussed: ? Goals Addressed   ? ?  ?  ?  ?  ?  ? This Visit's Progress  ?   Patient Stated (pt-stated)     ?   Would like to loose weight 10 lbs. ?  ? ?  ?  ? ?This is a list of Health Maintenance Items that are overdue or due now: ?Pneumococcal vaccine  ?Td vaccine ?Bone densitometry screening ?Shingrix ? ?Orders/Referrals Placed Today: ?No orders of the defined types were placed in this encounter. ? ?(Contact our referral department at [phone] you have not spoken with someone about your referral appointment within the next 5 days)  ? ? ?Follow-up Plan ?Follow-up  with ThSilverio DecampMD as planned ?Schedule your pneumonia, tetanus and shingrix vaccine at your pharmacy.  ?Referral for your bone density has been sent and they will call you to schedule. ?Medicare wellness visit in one year.  ?Patient will access AVS on mychart. ? ? ? ?  ?Health Maintenance, Female ?Adopting a healthy lifestyle and getting preventive care are important in promoting health and wellness. Ask your health care provider about: ?The right schedule for you to have regular tests and exams. ?Things you can do on your own to prevent diseases and keep yourself healthy. ?What should I know about diet, weight, and exercise? ?Eat a healthy diet ? ?Eat a diet that includes plenty of vegetables, fruits, low-fat dairy products, and lean protein. ?Do not eat a lot of foods that are high in solid fats, added sugars, or sodium. ?Maintain a healthy weight ?Body mass index (BMI) is used to identify weight problems. It estimates body fat based on height and weight. Your health care provider can help determine your BMI and help you achieve or maintain a healthy weight. ?Get regular exercise ?Get regular exercise. This is one of the most important things you can do for your health. Most adults should: ?Exercise for at least 150 minutes each week. The exercise should increase your heart rate and make you sweat (moderate-intensity exercise). ?Do strengthening exercises at least twice a week. This is in addition to the moderate-intensity exercise. ?Spend less time sitting. Even light physical activity can be beneficial. ?Watch cholesterol and blood  lipids ?Have your blood tested for lipids and cholesterol at 69 years of age, then have this test every 5 years. ?Have your cholesterol levels checked more often if: ?Your lipid or cholesterol levels are high. ?You are older than 69 years of age. ?You are at high risk for heart disease. ?What should I know about cancer screening? ?Depending on your health history and  family history, you may need to have cancer screening at various ages. This may include screening for: ?Breast cancer. ?Cervical cancer. ?Colorectal cancer. ?Skin cancer. ?Lung cancer. ?What should I know about heart disease, diabetes, and high blood pressure? ?Blood pressure and heart disease ?High blood pressure causes heart disease and increases the risk of stroke. This is more likely to develop in people who have high blood pressure readings or are overweight. ?Have your blood pressure checked: ?Every 3-5 years if you are 69-82 years of age. ?Every year if you are 69 years old or older. ?Diabetes ?Have regular diabetes screenings. This checks your fasting blood sugar level. Have the screening done: ?Once every three years after age 7 if you are at a normal weight and have a low risk for diabetes. ?More often and at a younger age if you are overweight or have a high risk for diabetes. ?What should I know about preventing infection? ?Hepatitis B ?If you have a higher risk for hepatitis B, you should be screened for this virus. Talk with your health care provider to find out if you are at risk for hepatitis B infection. ?Hepatitis C ?Testing is recommended for: ?Everyone born from [birth year] through 1963. ?Anyone with known risk factors for hepatitis C. ?Sexually transmitted infections (STIs) ?Get screened for STIs, including gonorrhea and chlamydia, if: ?You are sexually active and are younger than 70 years of age. ?You are older than 69 years of age and your health care provider tells you that you are at risk for this type of infection. ?Your sexual activity has changed since you were last screened, and you are at increased risk for chlamydia or gonorrhea. Ask your health care provider if you are at risk. ?Ask your health care provider about whether you are at high risk for HIV. Your health care provider may recommend a prescription medicine to help prevent HIV infection. If you choose to take medicine to prevent HIV,  you should first get tested for HIV. You should then be tested every 3 months for as long as you are taking the medicine. ?Pregnancy ?If you are about to stop having your period (premenopausal) and you may become pregnant, seek counseling before you get pregnant. ?Take 400 to 800 micrograms (mcg) of folic acid every day if you become pregnant. ?Ask for birth control (contraception) if you want to prevent pregnancy. ?Osteoporosis and menopause ?Osteoporosis is a disease in which the bones lose minerals and strength with aging. This can result in bone fractures. If you are 7 years old or older, or if you are at risk for osteoporosis and fractures, ask your health care provider if you should: ?Be screened for bone loss. ?Take a calcium or vitamin D supplement to lower your risk of fractures. ?Be given hormone replacement therapy (HRT) to treat symptoms of menopause. ?Follow these instructions at home: ?Alcohol use ?Do not drink alcohol if: ?Your health care provider tells you not to drink. ?You are pregnant, may be pregnant, or are planning to become pregnant. ?If you drink alcohol: ?Limit how much you have to: ?0-1 drink a day. ?Know how much  alcohol is in your drink. In the [place] one drink equals one 12 oz bottle of beer (355 mL), one 5 oz glass of wine (148 mL), or one 1? oz glass of hard liquor (44 mL). ?Lifestyle ?Do not use any products that contain nicotine or tobacco. These products include cigarettes, chewing tobacco, and vaping devices, such as e-cigarettes. If you need help quitting, ask your health care provider. ?Do not use street drugs. ?Do not share needles. ?Ask your health care provider for help if you need support or information about quitting drugs. ?General instructions ?Schedule regular health, dental, and eye exams. ?Stay current with your vaccines. ?Tell your health care provider if: ?You often feel depressed. ?You have ever been abused or do not feel safe at home. ?Summary ?Adopting a healthy  lifestyle and getting preventive care are important in promoting health and wellness. ?Follow your health care provider's instructions about healthy diet, exercising, and getting tested or screened for d

## 2021-06-22 NOTE — Progress Notes (Signed)
? ? ?[place] VISIT ? ?06/22/2021 ? ?Telephone Visit Disclaimer ?This Medicare AWV was conducted by telephone due to national recommendations for restrictions regarding the COVID-19 Pandemic (e.g. social distancing).  I verified, using two identifiers, that I am speaking with [name] or their authorized healthcare agent. I discussed the limitations, risks, security, and privacy concerns of performing an evaluation and management service by telephone and the potential availability of an in-person appointment in the future. The patient expressed understanding and agreed to proceed.  ?Location of Patient: Home ?Location of Provider (nurse):  Provider home ? ?Subjective:  ? ? ?[name] is a 71 y.o. female patient of Thekkekandam, Gwen Her, MD who had a Medicare Annual Wellness Visit today via telephone. [name] is Retired and lives with their spouse. she has 2 children. she reports that she is socially active and does interact with friends/family regularly. she is moderately physically active and enjoys reading, golf, playing softball and bowling. ? ?Patient Care Team: ?Silverio Decamp, MD as PCP - General (Sports Medicine) ?Maisie Fus, MD as Consulting Physician (Obstetrics and Gynecology) ?Laroy Apple, MD as Referring Physician (Physical Medicine and Rehabilitation) ? ? ?  06/22/2021  ?  8:07 AM 02/18/2020  ?  8:44 AM 02/09/2020  ? 12:30 PM 03/31/2016  ?  1:12 PM 03/22/2016  ?  8:28 AM 05/25/2014  ?  8:27 AM 03/12/2013  ?  2:34 PM  ?Advanced Directives  ?Does Patient Have a Medical Advance Directive? Yes Yes No No No Yes Patient has advance directive, copy not in chart  ?Type of Advance Directive Living will     Living will [place];Living will  ?Does patient want to make changes to medical advance directive? No - Patient declined No - Patient declined    Yes - information given   ?Would patient like information on creating a medical advance directive?  No -  Patient declined No - Patient declined Yes (MAU/Ambulatory/Procedural Areas - Information given) Yes (MAU/Ambulatory/Procedural Areas - Information given)    ? ? ?Hospital Utilization Over the Past 12 Months: ?# of hospitalizations or ER visits: 0 ?# of surgeries: 0 ? ?Review of Systems    ?Patient reports that her overall health is unchanged compared to last year. ? ?History obtained from chart review and the patient ? ?Patient Reported Readings (BP, Pulse, CBG, Weight, etc) ?none ? ?Pain Assessment ?Pain : No/denies pain ? ?  ? ?Current Medications & Allergies (verified) ?Allergies as of 06/22/2021   ? ?   Reactions  ? Hydrochlorothiazide W-triamterene Other (See Comments)  ? hypokalemia  ? Tramadol Itching  ? Povidone-iodine Itching  ? Intolerance, if exposed over a long period.  Ok if used and washed off quickly.  ? ?  ? ?  ?Medication List  ?  ? ?  ? Accurate as of June 22, 2021  8:21 AM. If you have any questions, ask your nurse or doctor.  ?  ?  ? ?  ? ?calcium carbonate 600 MG tablet ?Commonly known as: OS-CAL ?Take 600 mg by mouth 2 (two) times daily with a meal. ?  ?clotrimazole-betamethasone cream ?Commonly known as: LOTRISONE ?Apply 1 application topically 2 (two) times daily. ?  ?estradiol 0.1 MG/24HR patch ?Commonly known as: VIVELLE-DOT ?Place 1 patch onto the skin 2 (two) times a week. ?  ?famotidine 10 MG chewable tablet ?Commonly known as: PEPCID AC ?Chew 10 mg by mouth as needed. ?  ?Fish Oil 1200 MG Caps ?  Take 1 capsule by mouth daily. ?  ?meloxicam 15 MG tablet ?Commonly known as: MOBIC ?Take 1 tablet (15 mg total) by mouth daily. ?  ?metoprolol succinate 25 MG 24 hr tablet ?Commonly known as: TOPROL-XL ?TAKE 1 TABLET DAILY ?  ?omeprazole 20 MG capsule ?Commonly known as: PriLOSEC ?Take 1 capsule (20 mg total) by mouth daily. 30 days for gastroprotection while taking NSAIDs. ?  ?pravastatin 40 MG tablet ?Commonly known as: PRAVACHOL ?TAKE 1 TABLET DAILY ?  ?spironolactone 25 MG tablet ?Commonly  known as: ALDACTONE ?TAKE 1 TABLET TWICE A DAY ?  ?tiZANidine 4 MG tablet ?Commonly known as: ZANAFLEX ?TAKE 1 TABLET EVERY 6 HOURSAS NEEDED FOR MUSCLE SPASMS ?  ?triamcinolone 0.1 % cream : eucerin Crea ?Apply 1 application topically 2 (two) times daily. ?  ?Vitamin D3 125 MCG (5000 UT) Caps ?Take 5,000 Units by mouth daily. ?  ?zolpidem 5 MG tablet ?Commonly known as: AMBIEN ?TAKE ONE TABLET BY MOUTH EVERY 3RD NIGHT PRN AT [place] ?What changed:  ?how much to take ?how to take this ?when to take this ?reasons to take this ?additional instructions ?  ? ?  ? ? ?History (reviewed): ?Past Medical History:  ?Diagnosis Date  ? Abnormal vaginal Pap smear   ? monitored annually by Dr. Dory Horn  ? Colon polyps   ? multple right colon polyps  ? Degenerative joint disease   ? GERD (gastroesophageal reflux disease)   ? Hyperlipidemia   ? NMR: LDL 120 (1267/584), HDL 59, TG 60. LDL goall<130, ideally <100  ? Hypertension   ? Hypokalemia   ? on HCTZ/Triam  ? PONV (postoperative nausea and vomiting)   ? Vitamin D deficiency   ? 2000 IU  ? ?Past Surgical History:  ?Procedure Laterality Date  ? ABDOMINAL HYSTERECTOMY    ? APPENDECTOMY    ? BREAST BIOPSY    ? BUNIONECTOMY  03/2009  ? CHOLECYSTECTOMY  09/2007  ? Lap, Dr.Gerkin   ? COLONOSCOPY  2008  ? virtual colonoscopy  ? DILATION AND CURETTAGE OF UTERUS    ? ENDOMETRIAL BIOPSY  04/28/2013  ? Dr Nori Riis  ? ENDOSCOPIC CONCHA BULLOSA RESECTION Left 03/24/2013  ? Procedure: ENDOSCOPIC CONCHA BULLOSA RESECTION;  Surgeon: Izora Gala, MD;  Location: [place];  Service: ENT;  Laterality: Left;  ? ETHMOIDECTOMY Left 03/24/2013  ? Procedure: ETHMOIDECTOMY;  Surgeon: Izora Gala, MD;  Location: [place];  Service: ENT;  Laterality: Left;  ? HAMMER TOE SURGERY    ? HYSTEROSCOPY    ? w/ D and C in 09/2005,02/2007,03/2008 (+ poly removal); IDU insertion 09/2008, Dr.Neal  ? NEURECTOMY FOOT  03/2008  ? left  ? NEUROMA SURGERY    ? left foot ,Dr.Sikora  ? PARTIAL  COLECTOMY Right 03/30/2016  ? Procedure: RIGHT COLECTOMY;  Surgeon: Armandina Gemma, MD;  Location: [facility];  Service: General;  Laterality: Right;  ? TOE SURGERY    ? for bone spur  ? TOTAL SHOULDER ARTHROPLASTY Right 02/18/2020  ? Procedure: TOTAL SHOULDER ARTHROPLASTY;  Surgeon: Hiram Gash, MD;  Location: [facility];  Service: Orthopedics;  Laterality: Right;  ? TUBAL LIGATION    ? ?Family History  ?Problem Relation Age of Onset  ? Heart attack Father 67  ?     CABG 4-vessel  ? Diabetes Father   ?     late onset  ? Lung cancer Mother   ?     former smoker  ? Diabetes Mother   ?  late onset  ? Breast cancer Mother   ? Obesity Sister   ? Stroke Paternal Grandfather 56  ? Lung cancer Maternal Grandfather   ?     former smoker  ? Breast cancer Maternal Aunt   ? Heart attack Paternal Grandmother 36  ? Diabetes type I Other   ?     grand daughter  ? ?Social History  ? ?Socioeconomic History  ? Marital status: Married  ?  Spouse name: [name]  ? Number of children: 2  ? Years of education: 24  ? Highest education level: Associate degree: academic program  ?Occupational History  ? Occupation: Marine scientist, [employer]   ?Tobacco Use  ? Smoking status: Never  ? Smokeless tobacco: Never  ?Substance and Sexual Activity  ? Alcohol use: Yes  ?  Comment: occasional  ? Drug use: No  ? Sexual activity: Yes  ?  Birth control/protection: Surgical  ?Other Topics Concern  ? Not on file  ?Social History Narrative  ? Lives with her husband. She enjoys playing golf, bowling, softball and reading.   ? ?Social Determinants of Health  ? ?Financial Resource Strain: Low Risk   ? Difficulty of Paying Living Expenses: Not hard at all  ?Food Insecurity: No Food Insecurity  ? Worried About Charity fundraiser in the Last Year: Never true  ? Ran Out of Food in the Last Year: Never true  ?Transportation Needs: No Transportation Needs  ? Lack of Transportation (Medical): No  ? Lack of Transportation (Non-Medical): No  ?Physical Activity:  Sufficiently Active  ? Days of Exercise per Week: 3 days  ? Minutes of Exercise per Session: 60 min  ?Stress: No Stress Concern Present  ? Feeling of Stress : Not at all  ?Social Connections: Music therapist

## 2021-06-23 ENCOUNTER — Other Ambulatory Visit: Payer: Self-pay | Admitting: Sports Medicine

## 2021-06-23 ENCOUNTER — Encounter: Payer: Self-pay | Admitting: Medical-Surgical

## 2021-06-23 DIAGNOSIS — E782 Mixed hyperlipidemia: Secondary | ICD-10-CM

## 2021-07-05 DIAGNOSIS — E782 Mixed hyperlipidemia: Secondary | ICD-10-CM | POA: Diagnosis not present

## 2021-07-06 LAB — COMPREHENSIVE METABOLIC PANEL WITH GFR
AG Ratio: 1.6 (calc) (ref 1.0–2.5)
ALT: 23 U/L (ref 6–29)
AST: 22 U/L (ref 10–35)
Albumin: 4.1 g/dL (ref 3.6–5.1)
Alkaline phosphatase (APISO): 85 U/L (ref 37–153)
BUN: 14 mg/dL (ref 7–25)
CO2: 28 mmol/L (ref 20–32)
Calcium: 9.4 mg/dL (ref 8.6–10.4)
Chloride: 103 mmol/L (ref 98–110)
Creat: 0.96 mg/dL (ref 0.50–1.05)
Globulin: 2.5 g/dL (ref 1.9–3.7)
Glucose, Bld: 90 mg/dL (ref 65–99)
Potassium: 4 mmol/L (ref 3.5–5.3)
Sodium: 139 mmol/L (ref 135–146)
Total Bilirubin: 0.6 mg/dL (ref 0.2–1.2)
Total Protein: 6.6 g/dL (ref 6.1–8.1)

## 2021-07-06 LAB — CBC
HCT: 47 % — ABNORMAL HIGH (ref 35.0–45.0)
Hemoglobin: 15.5 g/dL (ref 11.7–15.5)
MCH: 32.7 pg (ref 27.0–33.0)
MCHC: 33 g/dL (ref 32.0–36.0)
MCV: 99.2 fL (ref 80.0–100.0)
MPV: 9.8 fL (ref 7.5–12.5)
Platelets: 234 10*3/uL (ref 140–400)
RBC: 4.74 10*6/uL (ref 3.80–5.10)
RDW: 11.7 % (ref 11.0–15.0)
WBC: 6.8 10*3/uL (ref 3.8–10.8)

## 2021-07-06 LAB — LIPID PANEL
Cholesterol: 143 mg/dL
HDL: 51 mg/dL
LDL Cholesterol (Calc): 71 mg/dL
Non-HDL Cholesterol (Calc): 92 mg/dL
Total CHOL/HDL Ratio: 2.8 (calc)
Triglycerides: 127 mg/dL

## 2021-07-06 LAB — TSH: TSH: 1.91 m[IU]/L (ref 0.40–4.50)

## 2021-07-08 ENCOUNTER — Ambulatory Visit (INDEPENDENT_AMBULATORY_CARE_PROVIDER_SITE_OTHER): Payer: Medicare HMO | Admitting: Sports Medicine

## 2021-07-08 ENCOUNTER — Encounter: Payer: Self-pay | Admitting: Sports Medicine

## 2021-07-08 VITALS — BP 115/74 | HR 61 | Ht 63.0 in | Wt 163.0 lb

## 2021-07-08 DIAGNOSIS — L918 Other hypertrophic disorders of the skin: Secondary | ICD-10-CM | POA: Insufficient documentation

## 2021-07-08 DIAGNOSIS — Z0000 Encounter for general adult medical examination without abnormal findings: Secondary | ICD-10-CM

## 2021-07-08 DIAGNOSIS — M19012 Primary osteoarthritis, left shoulder: Secondary | ICD-10-CM

## 2021-07-08 NOTE — Assessment & Plan Note (Addendum)
Irritated and painful tag with some findings consistent with a verruca anterior upper chest, cryotherapy as above. ?

## 2021-07-08 NOTE — Assessment & Plan Note (Signed)
Looking at shoulder replacement with Dr. Griffin Basil at the end of this year. ?

## 2021-07-08 NOTE — Progress Notes (Signed)
?Subjective:   ? ?CC: Annual Physical Exam ? ?HPI:  ?This patient is here for their annual physical ? ?I reviewed the past medical history, family history, social history, surgical history, and allergies today and no changes were needed.  Please see the problem list section below in epic for further details. ? ?Past Medical History: ?Past Medical History:  ?Diagnosis Date  ? Abnormal vaginal Pap smear   ? monitored annually by Dr. Dory Horn  ? Colon polyps   ? multple right colon polyps  ? Degenerative joint disease   ? GERD (gastroesophageal reflux disease)   ? Hyperlipidemia   ? NMR: LDL 120 (1267/584), HDL 59, TG 60. LDL goall<130, ideally <100  ? Hypertension   ? Hypokalemia   ? on HCTZ/Triam  ? PONV (postoperative nausea and vomiting)   ? Vitamin D deficiency   ? 2000 IU  ? ?Past Surgical History: ?Past Surgical History:  ?Procedure Laterality Date  ? ABDOMINAL HYSTERECTOMY    ? APPENDECTOMY    ? BREAST BIOPSY    ? BUNIONECTOMY  03/2009  ? CHOLECYSTECTOMY  09/2007  ? Lap, Dr.Gerkin   ? COLONOSCOPY  2008  ? virtual colonoscopy  ? DILATION AND CURETTAGE OF UTERUS    ? ENDOMETRIAL BIOPSY  04/28/2013  ? Dr Nori Riis  ? ENDOSCOPIC CONCHA BULLOSA RESECTION Left 03/24/2013  ? Procedure: ENDOSCOPIC CONCHA BULLOSA RESECTION;  Surgeon: Izora Gala, MD;  Location: [place];  Service: ENT;  Laterality: Left;  ? ETHMOIDECTOMY Left 03/24/2013  ? Procedure: ETHMOIDECTOMY;  Surgeon: Izora Gala, MD;  Location: [facility] [place];  Service: ENT;  Laterality: Left;  ? HAMMER TOE SURGERY    ? HYSTEROSCOPY    ? w/ D and C in 09/2005,02/2007,03/2008 (+ poly removal); IDU insertion 09/2008, Dr.Neal  ? NEURECTOMY FOOT  03/2008  ? left  ? NEUROMA SURGERY    ? left foot ,Dr.Sikora  ? PARTIAL COLECTOMY Right 03/30/2016  ? Procedure: RIGHT COLECTOMY;  Surgeon: Armandina Gemma, MD;  Location: [facility];  Service: General;  Laterality: Right;  ? TOE SURGERY    ? for bone spur  ? TOTAL SHOULDER ARTHROPLASTY Right 02/18/2020  ?  Procedure: TOTAL SHOULDER ARTHROPLASTY;  Surgeon: Hiram Gash, MD;  Location: [place];  Service: Orthopedics;  Laterality: Right;  ? TUBAL LIGATION    ? ?Social History: ?Social History  ? ?Socioeconomic History  ? Marital status: Married  ?  Spouse name: [name]  ? Number of children: 2  ? Years of education: 43  ? Highest education level: Associate degree: academic program  ?Occupational History  ? Occupation: Marine scientist, [employer]   ?Tobacco Use  ? Smoking status: Never  ? Smokeless tobacco: Never  ?Substance and Sexual Activity  ? Alcohol use: Yes  ?  Comment: occasional  ? Drug use: No  ? Sexual activity: Yes  ?  Birth control/protection: Surgical  ?Other Topics Concern  ? Not on file  ?Social History Narrative  ? Lives with her husband. She enjoys playing golf, bowling, softball and reading.   ? ?Social Determinants of Health  ? ?Financial Resource Strain: Low Risk   ? Difficulty of Paying Living Expenses: Not hard at all  ?Food Insecurity: No Food Insecurity  ? Worried About Charity fundraiser in the Last Year: Never true  ? Ran Out of Food in the Last Year: Never true  ?Transportation Needs: No Transportation Needs  ? Lack of Transportation (Medical): No  ? Lack of  Transportation (Non-Medical): No  ?Physical Activity: Sufficiently Active  ? Days of Exercise per Week: 3 days  ? Minutes of Exercise per Session: 60 min  ?Stress: No Stress Concern Present  ? Feeling of Stress : Not at all  ?Social Connections: Socially Integrated  ? Frequency of Communication with Friends and Family: Twice a week  ? Frequency of Social Gatherings with Friends and Family: Once a week  ? Attends Religious Services: More than 4 times per year  ? Active Member of Clubs or Organizations: Yes  ? Attends Archivist Meetings: Never  ? Marital Status: Married  ? ?Family History: ?Family History  ?Problem Relation Age of Onset  ? Heart attack Father 46  ?     CABG 4-vessel  ? Diabetes Father   ?     late onset   ? Lung cancer Mother   ?     former smoker  ? Diabetes Mother   ?     late onset  ? Breast cancer Mother   ? Obesity Sister   ? Stroke Paternal Grandfather 49  ? Lung cancer Maternal Grandfather   ?     former smoker  ? Breast cancer Maternal Aunt   ? Heart attack Paternal Grandmother 4  ? Diabetes type I Other   ?     grand daughter  ? ?Allergies: ?Allergies  ?Allergen Reactions  ? Hydrochlorothiazide W-Triamterene Other (See Comments)  ?  hypokalemia  ? Tramadol Itching  ? Povidone-Iodine Itching  ?  Intolerance, if exposed over a long period.  Ok if used and washed off quickly.  ? ?Medications: See med rec. ? ?Review of Systems: No headache, visual changes, nausea, vomiting, diarrhea, constipation, dizziness, abdominal pain, skin rash, fevers, chills, night sweats, swollen lymph nodes, weight loss, chest pain, body aches, joint swelling, muscle aches, shortness of breath, mood changes, visual or auditory hallucinations. ? ?Objective:   ? ?General: Well Developed, well nourished, and in no acute distress.  ?Neuro: Alert and oriented x3, extra-ocular muscles intact, sensation grossly intact. Cranial nerves II through XII are intact, motor, sensory, and coordinative functions are all intact. ?HEENT: Normocephalic, atraumatic, pupils equal round reactive to light, neck supple, no masses, no lymphadenopathy, thyroid nonpalpable. Oropharynx, nasopharynx, external ear canals are unremarkable. ?Skin: Warm and dry, no rashes noted.  There was a skin tag mid upper chest. ?Cardiac: Regular rate and rhythm, no murmurs rubs or gallops.  ?Respiratory: Clear to auscultation bilaterally. Not using accessory muscles, speaking in full sentences.  ?Abdominal: Soft, nontender, nondistended, positive bowel sounds, no masses, no organomegaly.  ?Musculoskeletal: Shoulder, elbow, wrist, hip, knee, ankle stable, and with full range of motion. ? ?Procedure:  Cryodestruction of upper chest skin tag ?Consent obtained and  verified. ?Time-out conducted. ?Noted no overlying erythema, induration, or other signs of local infection. ?Completed without difficulty using Cryo-Gun. ?Advised to call if fevers/chills, erythema, induration, drainage, or persistent bleeding. ? ?Impression and Recommendations:   ? ?The patient was counselled, risk factors were discussed, anticipatory guidance given. ? ?Annual physical exam ?Extremely healthy 71 year old female, retired from [employer]. ?Doing extremely well, labs look good, she is up-to-date on screenings, return in a year. ? ?Left shoulder osteoarthritis, status post right shoulder arthroplasty ?Looking at shoulder replacement with Dr. Griffin Basil at the end of this year. ? ?Skin tag ?Irritated and painful tag with some findings consistent with a verruca anterior upper chest, cryotherapy as above. ? ? ?___________________________________________ ?Gwen Her. Dianah Field, M.D., ABFM., CAQSM. ?Primary Care and  Sports Medicine ?[place] ? ?Adjunct Professor of Family Medicine  ?[employer] ?

## 2021-07-08 NOTE — Assessment & Plan Note (Signed)
Extremely healthy 71 year old female, retired from [employer]. ?Doing extremely well, labs look good, she is up-to-date on screenings, return in a year. ?

## 2021-07-13 ENCOUNTER — Ambulatory Visit (INDEPENDENT_AMBULATORY_CARE_PROVIDER_SITE_OTHER): Payer: Medicare HMO | Admitting: Sports Medicine

## 2021-07-13 ENCOUNTER — Ambulatory Visit: Payer: Self-pay

## 2021-07-13 ENCOUNTER — Ambulatory Visit (INDEPENDENT_AMBULATORY_CARE_PROVIDER_SITE_OTHER): Payer: Medicare HMO

## 2021-07-13 DIAGNOSIS — M47816 Spondylosis without myelopathy or radiculopathy, lumbar region: Secondary | ICD-10-CM | POA: Diagnosis not present

## 2021-07-13 NOTE — Progress Notes (Signed)
? ? ?  Procedures performed today:   ? ?Procedure: Real-time Ultrasound Guided injection of the left sacroiliac joint  ?device: Samsung HS60  ?Verbal informed consent obtained.  ?Time-out conducted.  ?Noted no overlying erythema, induration, or other signs of local infection.  ?Skin prepped in a sterile fashion.  ?Local anesthesia: Topical Ethyl chloride.  ?With sterile technique and under real time ultrasound guidance: 22-gauge spinal needle advanced into normal-appearing SI joint, 1 cc Kenalog 40, 2 cc lidocaine, 2 cc bupivacaine injected easily ?Completed without difficulty  ?Advised to call if fevers/chills, erythema, induration, drainage, or persistent bleeding.  ?Images permanently stored and available for review in PACS.  ?Impression: Technically successful ultrasound guided injection. ? ?Independent interpretation of notes and tests performed by another provider:  ? ?None. ? ?Brief History, Exam, Impression, and Recommendations:   ? ?Lumbar spondylosis with sacroiliac joint dysfunction ?Increasing pain, repeat left sacroiliac joint injection, last done in March 2022. ? ?Chronic process with exacerbation and pharmacologic intervention ? ?___________________________________________ ?Gwen Her. Dianah Field, M.D., ABFM., CAQSM. ?Primary Care and Sports Medicine ?[place] ? ?Adjunct Instructor of Family Medicine  ?[employer] ?

## 2021-07-13 NOTE — Assessment & Plan Note (Signed)
Increasing pain, repeat left sacroiliac joint injection, last done in March 2022. ? ?

## 2021-07-27 ENCOUNTER — Other Ambulatory Visit: Payer: Self-pay | Admitting: Sports Medicine

## 2021-08-06 ENCOUNTER — Other Ambulatory Visit: Payer: Self-pay | Admitting: Sports Medicine

## 2021-09-03 ENCOUNTER — Other Ambulatory Visit: Payer: Self-pay | Admitting: Sports Medicine

## 2021-09-13 ENCOUNTER — Other Ambulatory Visit: Payer: Self-pay | Admitting: Sports Medicine

## 2021-09-13 DIAGNOSIS — Z1231 Encounter for screening mammogram for malignant neoplasm of breast: Secondary | ICD-10-CM

## 2021-09-14 ENCOUNTER — Other Ambulatory Visit: Payer: Self-pay | Admitting: Sports Medicine

## 2021-10-06 ENCOUNTER — Other Ambulatory Visit: Payer: Self-pay | Admitting: Sports Medicine

## 2021-10-06 DIAGNOSIS — I10 Essential (primary) hypertension: Secondary | ICD-10-CM

## 2021-10-13 ENCOUNTER — Ambulatory Visit (INDEPENDENT_AMBULATORY_CARE_PROVIDER_SITE_OTHER): Payer: Medicare HMO

## 2021-10-13 ENCOUNTER — Ambulatory Visit (INDEPENDENT_AMBULATORY_CARE_PROVIDER_SITE_OTHER): Payer: Medicare HMO | Admitting: Sports Medicine

## 2021-10-13 ENCOUNTER — Telehealth: Payer: Self-pay | Admitting: Sports Medicine

## 2021-10-13 DIAGNOSIS — M170 Bilateral primary osteoarthritis of knee: Secondary | ICD-10-CM

## 2021-10-13 DIAGNOSIS — D229 Melanocytic nevi, unspecified: Secondary | ICD-10-CM | POA: Diagnosis not present

## 2021-10-13 NOTE — Assessment & Plan Note (Signed)
This is a very pleasant 71 year old female, known bilateral knee osteoarthritis, we finished Orthovisc in June of last year, did well until recently, having increasing pain right knee, injected today, we will get her approved for bilateral Orthovisc again.

## 2021-10-13 NOTE — Telephone Encounter (Signed)
[employer] paperwork faxed to [employer] at [phone] Request is for Orthovisc Pt's insurance prefers Orthovisc Fax confirmation receipt received  

## 2021-10-13 NOTE — Telephone Encounter (Signed)
Did well with bilateral Orthovisc last year in June, recurrence of pain, bilateral x-ray confirmed knee osteoarthritis, failed conservative measures including conditioning and injections, bilateral Orthovisc approval please

## 2021-10-13 NOTE — Assessment & Plan Note (Signed)
Right scalp, asymptomatic, we will observe for now.

## 2021-10-13 NOTE — Progress Notes (Signed)
    Procedures performed today:    Procedure: Real-time Ultrasound Guided injection of the right knee Device: Samsung HS60  Verbal informed consent obtained.  Time-out conducted.  Noted no overlying erythema, induration, or other signs of local infection.  Skin prepped in a sterile fashion.  Local anesthesia: Topical Ethyl chloride.  With sterile technique and under real time ultrasound guidance: No effusion noted, 1 cc Kenalog 40, 2 cc lidocaine, 2 cc bupivacaine injected easily Completed without difficulty  Advised to call if fevers/chills, erythema, induration, drainage, or persistent bleeding.  Images permanently stored and available for review in PACS.  Impression: Technically successful ultrasound guided injection.  Independent interpretation of notes and tests performed by another provider:   None.  Brief History, Exam, Impression, and Recommendations:    Primary osteoarthritis of both knees This is a very pleasant 71 year old female, known bilateral knee osteoarthritis, we finished Orthovisc in June of last year, did well until recently, having increasing pain right knee, injected today, we will get her approved for bilateral Orthovisc again.  Nevus sebaceous of [place] Right scalp, asymptomatic, we will observe for now.    ____________________________________________ Gwen Her. Dianah Field, M.D., ABFM., CAQSM., AME. Primary Care and Sports Medicine [facility]  Adjunct Professor of [place] [employer] of Medicine  Risk manager

## 2021-10-18 ENCOUNTER — Other Ambulatory Visit: Payer: Self-pay | Admitting: Orthopaedic Surgery

## 2021-10-18 DIAGNOSIS — Z01818 Encounter for other preprocedural examination: Secondary | ICD-10-CM

## 2021-10-18 DIAGNOSIS — M19012 Primary osteoarthritis, left shoulder: Secondary | ICD-10-CM | POA: Diagnosis not present

## 2021-10-19 NOTE — Telephone Encounter (Signed)
Benefits Investigation Details received from [employer] Injection: Orthovisc  Medical: Deductible does not apply. Once the OOP has been met, pt is covered at 100%. Only one copay per date of service.  PA required: Yes PA form and medical records faxed to [facility] at [phone]  Fax confirmation received  Pharmacy: Product is not covered under pharmacy plan.   Specialty Pharmacy: [facility]  May fill through: [facility] [place] Copay/Coinsurance:  Product Copay: 20% ($280) Administration Coinsurance:  Administration Copay: $25 Deductible:  Out of Pocket Max: $4500 (met: $0.91)    Total approx cost per injection is $305.

## 2021-10-28 ENCOUNTER — Ambulatory Visit (INDEPENDENT_AMBULATORY_CARE_PROVIDER_SITE_OTHER): Payer: Medicare HMO | Admitting: Sports Medicine

## 2021-10-28 ENCOUNTER — Ambulatory Visit (INDEPENDENT_AMBULATORY_CARE_PROVIDER_SITE_OTHER): Payer: Medicare HMO

## 2021-10-28 DIAGNOSIS — M47816 Spondylosis without myelopathy or radiculopathy, lumbar region: Secondary | ICD-10-CM

## 2021-10-28 NOTE — Progress Notes (Signed)
    Procedures performed today:    Procedure: Real-time Ultrasound Guided injection of the left sacroiliac joint Device: Samsung HS60  Verbal informed consent obtained.  Time-out conducted.  Noted no overlying erythema, induration, or other signs of local infection.  Skin prepped in a sterile fashion.  Local anesthesia: Topical Ethyl chloride.  With sterile technique and under real time ultrasound guidance: Noted minimally arthritic joint, 1 cc Kenalog 40, 2 cc lidocaine, 2 cc bupivacaine injected easily Completed without difficulty  Advised to call if fevers/chills, erythema, induration, drainage, or persistent bleeding.  Images permanently stored and available for review in PACS.  Impression: Technically successful ultrasound guided injection.  Independent interpretation of notes and tests performed by another provider:   None.  Brief History, Exam, Impression, and Recommendations:    Lumbar spondylosis with sacroiliac joint dysfunction Is a very pleasant 72 year old female, she has chronic left sacroiliac joint pain, she has had several injections, the last of which was in April of this year, increasing pain, repeated injection. I have asked her to look into cooled ablation for longer term relief.  Chronic process with exacerbation and pharmacologic intervention  ____________________________________________ Gwen Her. Dianah Field, M.D., ABFM., CAQSM., AME. Primary Care and Sports Medicine [facility] [facility]  Adjunct Professor of [place] [employer] [facility] of Medicine  Risk manager

## 2021-10-28 NOTE — Assessment & Plan Note (Signed)
Is a very pleasant 71 year old female, she has chronic left sacroiliac joint pain, she has had several injections, the last of which was in April of this year, increasing pain, repeated injection. I have asked her to look into cooled ablation for longer term relief.

## 2021-11-02 ENCOUNTER — Ambulatory Visit (INDEPENDENT_AMBULATORY_CARE_PROVIDER_SITE_OTHER): Payer: Medicare HMO

## 2021-11-02 DIAGNOSIS — Z0000 Encounter for general adult medical examination without abnormal findings: Secondary | ICD-10-CM

## 2021-11-02 DIAGNOSIS — Z780 Asymptomatic menopausal state: Secondary | ICD-10-CM | POA: Diagnosis not present

## 2021-11-02 DIAGNOSIS — M85852 Other specified disorders of bone density and structure, left thigh: Secondary | ICD-10-CM | POA: Diagnosis not present

## 2021-11-04 NOTE — Telephone Encounter (Signed)
PA obtained; Auth # [id number] good from 10/19/21 thru 01/19/22  Spoke with patient regarding her cost, she states that the most recent injection is helping a lot and she wants to hold off on doing the gel for know.

## 2021-11-10 ENCOUNTER — Ambulatory Visit (INDEPENDENT_AMBULATORY_CARE_PROVIDER_SITE_OTHER): Payer: Medicare HMO

## 2021-11-10 DIAGNOSIS — Z1231 Encounter for screening mammogram for malignant neoplasm of breast: Secondary | ICD-10-CM | POA: Diagnosis not present

## 2021-11-11 ENCOUNTER — Ambulatory Visit
Admission: RE | Admit: 2021-11-11 | Discharge: 2021-11-11 | Disposition: A | Discharge status: Home / Self Care | Payer: Medicare HMO | Source: Ambulatory Visit | Attending: Orthopaedic Surgery | Admitting: Orthopaedic Surgery

## 2021-11-11 DIAGNOSIS — Z01818 Encounter for other preprocedural examination: Secondary | ICD-10-CM

## 2021-11-11 DIAGNOSIS — M19012 Primary osteoarthritis, left shoulder: Secondary | ICD-10-CM | POA: Diagnosis not present

## 2021-11-24 ENCOUNTER — Encounter: Payer: Self-pay | Admitting: Sports Medicine

## 2021-12-05 ENCOUNTER — Other Ambulatory Visit: Payer: Self-pay | Admitting: Sports Medicine

## 2021-12-29 DIAGNOSIS — R2689 Other abnormalities of gait and mobility: Secondary | ICD-10-CM | POA: Diagnosis not present

## 2021-12-29 DIAGNOSIS — M25512 Pain in left shoulder: Secondary | ICD-10-CM | POA: Diagnosis not present

## 2021-12-29 DIAGNOSIS — Z723 Lack of physical exercise: Secondary | ICD-10-CM | POA: Diagnosis not present

## 2021-12-29 DIAGNOSIS — G8929 Other chronic pain: Secondary | ICD-10-CM | POA: Diagnosis not present

## 2021-12-29 DIAGNOSIS — M25612 Stiffness of left shoulder, not elsewhere classified: Secondary | ICD-10-CM | POA: Diagnosis not present

## 2021-12-29 DIAGNOSIS — R6889 Other general symptoms and signs: Secondary | ICD-10-CM | POA: Diagnosis not present

## 2022-01-01 ENCOUNTER — Other Ambulatory Visit: Payer: Self-pay | Admitting: Sports Medicine

## 2022-01-03 ENCOUNTER — Encounter (HOSPITAL_BASED_OUTPATIENT_CLINIC_OR_DEPARTMENT_OTHER): Payer: Self-pay | Admitting: Orthopaedic Surgery

## 2022-01-03 ENCOUNTER — Other Ambulatory Visit: Payer: Self-pay

## 2022-01-05 DIAGNOSIS — Z723 Lack of physical exercise: Secondary | ICD-10-CM | POA: Diagnosis not present

## 2022-01-05 DIAGNOSIS — M25512 Pain in left shoulder: Secondary | ICD-10-CM | POA: Diagnosis not present

## 2022-01-05 DIAGNOSIS — R29898 Other symptoms and signs involving the musculoskeletal system: Secondary | ICD-10-CM | POA: Diagnosis not present

## 2022-01-05 DIAGNOSIS — M25612 Stiffness of left shoulder, not elsewhere classified: Secondary | ICD-10-CM | POA: Diagnosis not present

## 2022-01-11 DIAGNOSIS — M6289 Other specified disorders of muscle: Secondary | ICD-10-CM | POA: Diagnosis not present

## 2022-01-11 DIAGNOSIS — Z723 Lack of physical exercise: Secondary | ICD-10-CM | POA: Diagnosis not present

## 2022-01-11 DIAGNOSIS — M6281 Muscle weakness (generalized): Secondary | ICD-10-CM | POA: Diagnosis not present

## 2022-01-11 DIAGNOSIS — M25512 Pain in left shoulder: Secondary | ICD-10-CM | POA: Diagnosis not present

## 2022-01-11 DIAGNOSIS — R29898 Other symptoms and signs involving the musculoskeletal system: Secondary | ICD-10-CM | POA: Diagnosis not present

## 2022-01-11 DIAGNOSIS — R6889 Other general symptoms and signs: Secondary | ICD-10-CM | POA: Diagnosis not present

## 2022-01-12 ENCOUNTER — Ambulatory Visit (INDEPENDENT_AMBULATORY_CARE_PROVIDER_SITE_OTHER): Payer: Medicare HMO | Admitting: Sports Medicine

## 2022-01-12 ENCOUNTER — Ambulatory Visit (INDEPENDENT_AMBULATORY_CARE_PROVIDER_SITE_OTHER): Payer: Medicare HMO

## 2022-01-12 ENCOUNTER — Ambulatory Visit (HOSPITAL_BASED_OUTPATIENT_CLINIC_OR_DEPARTMENT_OTHER): Admit: 2022-01-12 | Payer: Medicare HMO | Admitting: Orthopaedic Surgery

## 2022-01-12 ENCOUNTER — Ambulatory Visit: Payer: Self-pay

## 2022-01-12 ENCOUNTER — Encounter: Payer: Self-pay | Admitting: Sports Medicine

## 2022-01-12 DIAGNOSIS — M170 Bilateral primary osteoarthritis of knee: Secondary | ICD-10-CM

## 2022-01-12 DIAGNOSIS — I10 Essential (primary) hypertension: Secondary | ICD-10-CM

## 2022-01-12 DIAGNOSIS — Z01818 Encounter for other preprocedural examination: Secondary | ICD-10-CM

## 2022-01-12 SURGERY — ARTHROPLASTY, SHOULDER, TOTAL
Anesthesia: General | Site: Shoulder | Laterality: Left

## 2022-01-12 NOTE — Assessment & Plan Note (Addendum)
Restarting Orthovisc, #1 of 4 right knee only, return in 1 week for #2 of 4 right knee only.

## 2022-01-12 NOTE — Progress Notes (Signed)
    Procedures performed today:    Procedure: Real-time Ultrasound Guided injection of the right knee Device: Samsung HS60 Verbal informed consent obtained. Time-out conducted. Noted no overlying erythema, induration, or other signs of local infection. Skin prepped in a sterile fashion. Local anesthesia: Topical Ethyl chloride. With sterile technique and under real time ultrasound guidance:  Noted mild effusion, 30 mg/2 mL of OrthoVisc (sodium hyaluronate) in a prefilled syringe was injected easily into the knee through a 22-gauge needle. Completed without difficulty Advised to call if fevers/chills, erythema, induration, drainage, or persistent bleeding. Images permanently stored and available for review in PACS. Impression: Technically successful ultrasound guided injection.  Independent interpretation of notes and tests performed by another provider:   None.  Brief History, Exam, Impression, and Recommendations:    Primary osteoarthritis of both knees Restarting Orthovisc, #1 of 4 right knee only, return in 1 week for #2 of 4 right knee only.    ____________________________________________ Gwen Her. Dianah Field, M.D., ABFM., CAQSM., AME. Primary Care and Sports Medicine [place] [facility]  Adjunct Professor of [place] [employer] [facility] of Medicine  Risk manager

## 2022-01-17 DIAGNOSIS — M6281 Muscle weakness (generalized): Secondary | ICD-10-CM | POA: Diagnosis not present

## 2022-01-17 DIAGNOSIS — R29898 Other symptoms and signs involving the musculoskeletal system: Secondary | ICD-10-CM | POA: Diagnosis not present

## 2022-01-17 DIAGNOSIS — M25512 Pain in left shoulder: Secondary | ICD-10-CM | POA: Diagnosis not present

## 2022-01-17 DIAGNOSIS — Z723 Lack of physical exercise: Secondary | ICD-10-CM | POA: Diagnosis not present

## 2022-01-17 DIAGNOSIS — M6289 Other specified disorders of muscle: Secondary | ICD-10-CM | POA: Diagnosis not present

## 2022-01-17 DIAGNOSIS — R6889 Other general symptoms and signs: Secondary | ICD-10-CM | POA: Diagnosis not present

## 2022-01-19 ENCOUNTER — Ambulatory Visit (INDEPENDENT_AMBULATORY_CARE_PROVIDER_SITE_OTHER): Payer: Medicare HMO

## 2022-01-19 ENCOUNTER — Ambulatory Visit (INDEPENDENT_AMBULATORY_CARE_PROVIDER_SITE_OTHER): Payer: Medicare HMO | Admitting: Sports Medicine

## 2022-01-19 DIAGNOSIS — M170 Bilateral primary osteoarthritis of knee: Secondary | ICD-10-CM | POA: Diagnosis not present

## 2022-01-19 NOTE — Progress Notes (Signed)
    Procedures performed today:    Procedure: Real-time Ultrasound Guided injection of the right knee Device: Samsung HS60 Verbal informed consent obtained. Time-out conducted. Noted no overlying erythema, induration, or other signs of local infection. Skin prepped in a sterile fashion. Local anesthesia: Topical Ethyl chloride. With sterile technique and under real time ultrasound guidance:  Noted mild effusion, 30 mg/2 mL of OrthoVisc (sodium hyaluronate) in a prefilled syringe was injected easily into the knee through a 22-gauge needle. Completed without difficulty Advised to call if fevers/chills, erythema, induration, drainage, or persistent bleeding. Images permanently stored and available for review in PACS. Impression: Technically successful ultrasound guided injection.  Independent interpretation of notes and tests performed by another provider:   None.  Brief History, Exam, Impression, and Recommendations:    Primary osteoarthritis of both knees Orthovisc 2 of 4 right knee, return in 1 week for 3 of 4.    ____________________________________________ Gwen Her. Dianah Field, M.D., ABFM., CAQSM., AME. Primary Care and Sports Medicine [place] [facility]  Adjunct Professor of [place] [employer] [facility] of Medicine  Risk manager

## 2022-01-19 NOTE — Assessment & Plan Note (Signed)
Orthovisc 2 of 4 right knee, return in 1 week for 3 of 4.

## 2022-01-24 DIAGNOSIS — M6289 Other specified disorders of muscle: Secondary | ICD-10-CM | POA: Diagnosis not present

## 2022-01-24 DIAGNOSIS — R6889 Other general symptoms and signs: Secondary | ICD-10-CM | POA: Diagnosis not present

## 2022-01-24 DIAGNOSIS — R29898 Other symptoms and signs involving the musculoskeletal system: Secondary | ICD-10-CM | POA: Diagnosis not present

## 2022-01-24 DIAGNOSIS — M6281 Muscle weakness (generalized): Secondary | ICD-10-CM | POA: Diagnosis not present

## 2022-01-24 DIAGNOSIS — Z723 Lack of physical exercise: Secondary | ICD-10-CM | POA: Diagnosis not present

## 2022-01-24 DIAGNOSIS — M25512 Pain in left shoulder: Secondary | ICD-10-CM | POA: Diagnosis not present

## 2022-01-26 ENCOUNTER — Other Ambulatory Visit: Payer: Self-pay | Admitting: Sports Medicine

## 2022-01-26 ENCOUNTER — Ambulatory Visit (INDEPENDENT_AMBULATORY_CARE_PROVIDER_SITE_OTHER): Payer: Medicare HMO

## 2022-01-26 ENCOUNTER — Ambulatory Visit (INDEPENDENT_AMBULATORY_CARE_PROVIDER_SITE_OTHER): Payer: Medicare HMO | Admitting: Sports Medicine

## 2022-01-26 DIAGNOSIS — M170 Bilateral primary osteoarthritis of knee: Secondary | ICD-10-CM | POA: Diagnosis not present

## 2022-01-26 DIAGNOSIS — M1711 Unilateral primary osteoarthritis, right knee: Secondary | ICD-10-CM | POA: Diagnosis not present

## 2022-01-26 MED ORDER — HYALURONAN 30 MG/2ML IX SOSY
30.0000 mg | PREFILLED_SYRINGE | Freq: Once | INTRA_ARTICULAR | Status: AC
  Administered 2022-01-26: 30 mg via INTRA_ARTICULAR

## 2022-01-26 NOTE — Progress Notes (Signed)
    Procedures performed today:    Procedure: Real-time Ultrasound Guided injection of the right knee Device: Samsung HS60 Verbal informed consent obtained. Time-out conducted. Noted no overlying erythema, induration, or other signs of local infection. Skin prepped in a sterile fashion. Local anesthesia: Topical Ethyl chloride. With sterile technique and under real time ultrasound guidance:  Noted mild effusion, 30 mg/2 mL of OrthoVisc (sodium hyaluronate) in a prefilled syringe was injected easily into the knee through a 22-gauge needle. Completed without difficulty Advised to call if fevers/chills, erythema, induration, drainage, or persistent bleeding. Images permanently stored and available for review in PACS. Impression: Technically successful ultrasound guided injection.  Independent interpretation of notes and tests performed by another provider:   None.  Brief History, Exam, Impression, and Recommendations:    Primary osteoarthritis of both knees Orthovisc 3 of 4 right knee, return in 1 week for #4 of 4. Has a lot of leaves to do in her neighbors yard this week.    ____________________________________________ Gwen Her. Dianah Field, M.D., ABFM., CAQSM., AME. Primary Care and Sports Medicine [place] [facility]  Adjunct Professor of [place] of [facility] of Medicine  Risk manager

## 2022-01-26 NOTE — Addendum Note (Signed)
Addended by: Tarri Glenn A on: 01/26/2022 08:43 AM   Modules accepted: Orders

## 2022-01-26 NOTE — Assessment & Plan Note (Signed)
Orthovisc 3 of 4 right knee, return in 1 week for #4 of 4. Has a lot of leaves to do in her neighbors yard this week.

## 2022-01-31 DIAGNOSIS — M25512 Pain in left shoulder: Secondary | ICD-10-CM | POA: Diagnosis not present

## 2022-01-31 DIAGNOSIS — M6281 Muscle weakness (generalized): Secondary | ICD-10-CM | POA: Diagnosis not present

## 2022-01-31 DIAGNOSIS — R6889 Other general symptoms and signs: Secondary | ICD-10-CM | POA: Diagnosis not present

## 2022-01-31 DIAGNOSIS — Z723 Lack of physical exercise: Secondary | ICD-10-CM | POA: Diagnosis not present

## 2022-01-31 DIAGNOSIS — M6289 Other specified disorders of muscle: Secondary | ICD-10-CM | POA: Diagnosis not present

## 2022-01-31 DIAGNOSIS — R29898 Other symptoms and signs involving the musculoskeletal system: Secondary | ICD-10-CM | POA: Diagnosis not present

## 2022-02-01 ENCOUNTER — Ambulatory Visit (INDEPENDENT_AMBULATORY_CARE_PROVIDER_SITE_OTHER): Payer: Medicare HMO

## 2022-02-01 ENCOUNTER — Ambulatory Visit (INDEPENDENT_AMBULATORY_CARE_PROVIDER_SITE_OTHER): Payer: Medicare HMO | Admitting: Sports Medicine

## 2022-02-01 DIAGNOSIS — M170 Bilateral primary osteoarthritis of knee: Secondary | ICD-10-CM | POA: Diagnosis not present

## 2022-02-01 DIAGNOSIS — M1711 Unilateral primary osteoarthritis, right knee: Secondary | ICD-10-CM

## 2022-02-01 MED ORDER — HYALURONAN 30 MG/2ML IX SOSY
30.0000 mg | PREFILLED_SYRINGE | Freq: Once | INTRA_ARTICULAR | Status: AC
  Administered 2022-02-01: 30 mg via INTRA_ARTICULAR

## 2022-02-01 NOTE — Assessment & Plan Note (Signed)
Orthovisc 4 of 4 right knee, doing well. Return to see me as needed.

## 2022-02-01 NOTE — Progress Notes (Signed)
    Procedures performed today:    Procedure: Real-time Ultrasound Guided injection of the right knee Device: Samsung HS60 Verbal informed consent obtained. Time-out conducted. Noted no overlying erythema, induration, or other signs of local infection. Skin prepped in a sterile fashion. Local anesthesia: Topical Ethyl chloride. With sterile technique and under real time ultrasound guidance:  Noted mild effusion, 30 mg/2 mL of OrthoVisc (sodium hyaluronate) in a prefilled syringe was injected easily into the knee through a 22-gauge needle. Completed without difficulty Advised to call if fevers/chills, erythema, induration, drainage, or persistent bleeding. Images permanently stored and available for review in PACS. Impression: Technically successful ultrasound guided injection.  Independent interpretation of notes and tests performed by another provider:   None.  Brief History, Exam, Impression, and Recommendations:    Primary osteoarthritis of both knees Orthovisc 4 of 4 right knee, doing well. Return to see me as needed.    ____________________________________________ Gwen Her. Dianah Field, M.D., ABFM., CAQSM., AME. Primary Care and Sports Medicine [place] [facility]  Adjunct Professor of [place] of [facility] of Medicine  Risk manager

## 2022-02-06 ENCOUNTER — Encounter: Payer: Self-pay | Admitting: Sports Medicine

## 2022-02-07 ENCOUNTER — Other Ambulatory Visit: Payer: Self-pay | Admitting: Sports Medicine

## 2022-02-07 DIAGNOSIS — Z723 Lack of physical exercise: Secondary | ICD-10-CM | POA: Diagnosis not present

## 2022-02-07 DIAGNOSIS — R29898 Other symptoms and signs involving the musculoskeletal system: Secondary | ICD-10-CM | POA: Diagnosis not present

## 2022-02-07 DIAGNOSIS — M6289 Other specified disorders of muscle: Secondary | ICD-10-CM | POA: Diagnosis not present

## 2022-02-07 DIAGNOSIS — M6281 Muscle weakness (generalized): Secondary | ICD-10-CM | POA: Diagnosis not present

## 2022-02-07 DIAGNOSIS — R6889 Other general symptoms and signs: Secondary | ICD-10-CM | POA: Diagnosis not present

## 2022-02-07 DIAGNOSIS — M25512 Pain in left shoulder: Secondary | ICD-10-CM | POA: Diagnosis not present

## 2022-02-14 NOTE — Care Plan (Signed)
Ortho Bundle Case Management Note  Patient Details  Name: [name] MRN: [id number] Date of Birth: 12-Jan-1951   spoke with patient. she will discharge to home with family to assist. no DME needs. OPPT set up with [facility] [place]. discharge instructions mailed to patient. questions answered.                  DME Arranged:    DME Agency:     HH Arranged:    HH Agency:     Additional Comments: Please contact me with any questions of if this plan should need to change.  Ladell Heads,  [place] Specialist  [phone] 02/14/2022, 10:45 AM

## 2022-02-16 ENCOUNTER — Encounter (HOSPITAL_BASED_OUTPATIENT_CLINIC_OR_DEPARTMENT_OTHER): Payer: Self-pay | Admitting: Orthopaedic Surgery

## 2022-02-20 ENCOUNTER — Encounter (HOSPITAL_BASED_OUTPATIENT_CLINIC_OR_DEPARTMENT_OTHER)
Admission: RE | Admit: 2022-02-20 | Discharge: 2022-02-20 | Disposition: A | Discharge status: Home / Self Care | Payer: Medicare HMO | Source: Ambulatory Visit | Attending: Orthopaedic Surgery | Admitting: Orthopaedic Surgery

## 2022-02-20 DIAGNOSIS — Z01818 Encounter for other preprocedural examination: Secondary | ICD-10-CM | POA: Diagnosis not present

## 2022-02-20 LAB — BASIC METABOLIC PANEL
Anion gap: 5 (ref 5–15)
BUN: 10 mg/dL (ref 8–23)
CO2: 26 mmol/L (ref 22–32)
Calcium: 8.9 mg/dL (ref 8.9–10.3)
Chloride: 105 mmol/L (ref 98–111)
Creatinine, Ser: 0.92 mg/dL (ref 0.44–1.00)
GFR, Estimated: >60 mL/min (ref >60)
Glucose, Bld: 91 mg/dL (ref 70–99)
Potassium: 4.8 mmol/L (ref 3.5–5.1)
Sodium: 136 mmol/L (ref 135–145)

## 2022-02-20 LAB — SURGICAL PCR SCREEN
MRSA, PCR: NEGATIVE
Staphylococcus aureus: POSITIVE — AB

## 2022-02-20 NOTE — Progress Notes (Signed)
Surgical soap given with instructions, pt verbalized understanding. Benzoyl peroxide gel given with instructions, pt verbalized understanding.  

## 2022-02-22 NOTE — Discharge Instructions (Signed)
Ophelia Charter MD, MPH Noemi Chapel, PA-C [place] [address] [phone] (tel)   [phone] (fax)   [place] may leave the operative dressing in place until your follow-up appointment. KEEP THE INCISIONS CLEAN AND DRY. There may be a small amount of fluid/bleeding leaking at the surgical site. This is normal after surgery.  If it fills with liquid or blood please call us immediately to change it for you. Use the provided ice machine or Ice packs as often as possible for the first 3-4 days, then as needed for pain relief.   Keep a layer of cloth or a shirt between your skin and the cooling unit to prevent frost bite as it can get very cold.  SHOWERING: - You may shower on Post-Op Day #2.  - The dressing is water resistant but do not scrub it as it may start to peel up.   - You may remove the sling for showering - Gently pat the area dry.  - Do not soak the shoulder in water.  - Do not go swimming in the pool or ocean until your incision has completely healed (about 4-6 weeks after surgery) - KEEP THE INCISIONS CLEAN AND DRY.  EXERCISES Wear the sling at all times  You may remove the sling for showering, but keep the arm across the chest or in a secondary sling.    Accidental/Purposeful External Rotation and shoulder flexion (reaching behind you) is to be avoided at all costs for the first month. It is ok to come out of your sling if your are sitting and have assistance for eating.   Do not lift anything heavier than 1 pound until we discuss it further in clinic.  It is normal for your fingers/hand to become more swollen after surgery and discolored from bruising.   This will resolve over the first few weeks usually after surgery. Please continue to ambulate and do not stay sitting or lying for too long.  Perform foot and wrist pumps to assist in circulation.  PHYSICAL  THERAPY - You will begin physical therapy soon after surgery (unless otherwise specified) - Please call to set up an appointment, if you do not already have one  - Let our office if there are any issues with scheduling your therapy   - A PT referral was sent to [facility] in [place] ([place]) The anesthesia team may have performed a nerve block for you this is a great tool used to minimize pain.   The block may start wearing off overnight (between 8-24 hours postop) When the block wears off, your pain may go from nearly zero to the pain you would have had postop without the block. This is an abrupt transition but nothing dangerous is happening.   This can be a challenging period but utilize your as needed pain medications to try and manage this period. We suggest you use the pain medication the first night prior to going to bed, to ease this transition.  You may take an extra dose of narcotic when this happens if needed   POST-OP MEDICATIONS- Multimodal approach to pain control In general your pain will be controlled with a combination of substances.  Prescriptions unless otherwise discussed are electronically sent to your pharmacy.  This is a carefully made plan we use to minimize narcotic use.     Meloxicam - Anti-inflammatory medication taken on  a scheduled basis Acetaminophen - Non-narcotic pain medicine taken on a scheduled basis  Oxycodone - This is a strong narcotic, to be used only on an "as needed" basis for SEVERE pain. Aspirin '81mg'$  - This medicine is used to minimize the risk of blood clots after surgery. Omeprazole - daily medicine to protect your stomach while taking anti-inflammatories.   Zofran -  take as needed for nausea   FOLLOW-UP If you develop a Fever (>101.5), Redness or Drainage from the surgical incision site, please call our office to arrange for an evaluation. Please call the office to schedule a follow-up appointment for a  wound check, 7-10 days post-operatively.  IF YOU HAVE ANY QUESTIONS, PLEASE FEEL FREE TO CALL OUR OFFICE.  HELPFUL INFORMATION  Your arm will be in a sling following surgery. You will be in this sling for the next 4 weeks.   You may be more comfortable sleeping in a semi-seated position the first few nights following surgery.  Keep a pillow propped under the elbow and forearm for comfort.  If you have a recliner type of chair it might be beneficial.  If not that is fine too, but it would be helpful to sleep propped up with pillows behind your operated shoulder as well under your elbow and forearm.  This will reduce pulling on the suture lines.  When dressing, put your operative arm in the sleeve first.  When getting undressed, take your operative arm out last.  Loose fitting, button-down shirts are recommended.  In most states it is against the law to drive while your arm is in a sling. And certainly against the law to drive while taking narcotics.  You may return to work/school in the next couple of days when you feel up to it. Desk work and typing in the sling is fine.  We suggest you use the pain medication the first night prior to going to bed, in order to ease any pain when the anesthesia wears off. You should avoid taking pain medications on an empty stomach as it will make you nauseous.  You should wean off your narcotic medicines as soon as you are able.     Most patients will be off or using minimal narcotics before their first postop appointment.   Do not drink alcoholic beverages or take illicit drugs when taking pain medications.  Pain medication may make you constipated.  Below are a few solutions to try in this order: Decrease the amount of pain medication if you aren't having pain. Drink lots of decaffeinated fluids. Drink prune juice and/or each dried prunes  If the first 3 don't work start with additional solutions Take Colace - an over-the-counter stool softener Take  Senokot - an over-the-counter laxative Take Miralax - a stronger over-the-counter laxative   Dental Antibiotics:  In most cases prophylactic antibiotics for Dental procdeures after total joint surgery are not necessary.  Exceptions are as follows:  1. History of prior total joint infection  2. Severely immunocompromised (Organ Transplant, cancer chemotherapy, Rheumatoid biologic meds such as [place])  3. Poorly controlled diabetes (A1C &gt; 8.0, blood glucose over 200)  If you have one of these conditions, contact your surgeon for an antibiotic prescription, prior to your dental procedure.   For more information including helpful videos and documents visit our website:   [email or web address]    Post Anesthesia Home Care Instructions  Activity: Get plenty of rest for the remainder of the day. A responsible individual must stay with you for  24 hours following the procedure.  For the next 24 hours, DO NOT: -Drive a car -Paediatric nurse -Drink alcoholic beverages -Take any medication unless instructed by your physician -Make any legal decisions or sign important papers.  Meals: Start with liquid foods such as gelatin or soup. Progress to regular foods as tolerated. Avoid greasy, spicy, heavy foods. If nausea and/or vomiting occur, drink only clear liquids until the nausea and/or vomiting subsides. Call your physician if vomiting continues.  Special Instructions/Symptoms: Your throat may feel dry or sore from the anesthesia or the breathing tube placed in your throat during surgery. If this causes discomfort, gargle with warm salt water. The discomfort should disappear within 24 hours.  If you had a scopolamine patch placed behind your ear for the management of post- operative nausea and/or vomiting:  1. The medication in the patch is effective for 72 hours, after which it should be removed.  Wrap patch in a tissue and discard in the trash.  Wash hands thoroughly with soap and water. 2. You may remove the patch earlier than 72 hours if you experience unpleasant side effects which may include dry mouth, dizziness or visual disturbances. 3. Avoid touching the patch. Wash your hands with soap and water after contact with the patch.     May have Tylenol today after 2:30 PM   Regional Anesthesia Blocks  1. Numbness or the inability to move the "blocked" extremity may last from 3-48 hours after placement. The length of time depends on the medication injected and your individual response to the medication. If the numbness is not going away after 48 hours, call your surgeon.  2. The extremity that is blocked will need to be protected until the numbness is gone and the  Strength has returned. Because you cannot feel it, you will need to take extra care to avoid injury. Because it may be weak, you may have difficulty moving it or using it. You may not know what position it is in without looking at it while the block is in effect.  3. For blocks in the legs and feet, returning to weight bearing and walking needs to be done carefully. You will need to wait until the numbness is entirely gone and the strength has returned. You should be able to move your leg and foot normally before you try and bear weight or walk. You will need someone to be with you when you first try to ensure you do not fall and possibly risk injury.  4. Bruising and tenderness at the needle site are common side effects and will resolve in a few days.  5. Persistent numbness or new problems with movement should be communicated to the surgeon or the [place] [phone] [place] [phone]).

## 2022-02-22 NOTE — H&P (Signed)
PREOPERATIVE H&P  Chief Complaint: LEFT shoulder djd  HPI: [name] is a 71 y.o. female who is scheduled for Procedure(s): TOTAL SHOULDER ARTHROPLASTY.   Patient has a past medical history significant for GERD, HLD, PONV.   [name] is a 71 year old whom [name] did an anatomic total shoulder arthroplasty on the right. This is doing great. She wants to talk about the left as she has similar symptoms. This bothers her sleep. She has trouble with overhead activities.   Symptoms are rated as moderate to severe, and have been worsening.  This is significantly impairing activities of daily living.    Please see clinic note for further details on this patient's care.    She has elected for surgical management.   Past Medical History:  Diagnosis Date   Abnormal vaginal Pap smear    monitored annually by [name]   Colon polyps    multple right colon polyps   Degenerative joint disease    GERD (gastroesophageal reflux disease)    Hyperlipidemia    NMR: LDL 120 (1267/584), HDL 59, TG 60. LDL goall<130, ideally <100   Hypertension    Hypokalemia    on HCTZ/Triam   PONV (postoperative nausea and vomiting)    Vitamin D deficiency    2000 IU   Past Surgical History:  Procedure Laterality Date   ABDOMINAL HYSTERECTOMY     APPENDECTOMY     BREAST BIOPSY     BUNIONECTOMY  03/2009   CHOLECYSTECTOMY  09/2007   Lap, AmyGerkin    COLONOSCOPY  2008   virtual colonoscopy   DILATION AND CURETTAGE OF UTERUS     ENDOMETRIAL BIOPSY  04/28/2013   [name]   ENDOSCOPIC CONCHA BULLOSA RESECTION Left 03/24/2013   Procedure: ENDOSCOPIC CONCHA BULLOSA RESECTION;  Surgeon: Amy Gala, MD;  Location: [place];  Service: ENT;  Laterality: Left;   ETHMOIDECTOMY Left 03/24/2013   Procedure: ETHMOIDECTOMY;  Surgeon: Amy Gala, MD;  Location: [place];  Service: ENT;  Laterality: Left;   HAMMER TOE SURGERY     HYSTEROSCOPY     w/ D and C in  09/2005,02/2007,03/2008 (+ poly removal); IDU insertion 09/2008, AmyNeal   NEURECTOMY FOOT  03/2008   left   NEUROMA SURGERY     left foot ,AmySikora   PARTIAL COLECTOMY Right 03/30/2016   Procedure: RIGHT COLECTOMY;  Surgeon: Armandina Gemma, MD;  Location: [place];  Service: General;  Laterality: Right;   TOE SURGERY     for bone spur   TOTAL SHOULDER ARTHROPLASTY Right 02/18/2020   Procedure: TOTAL SHOULDER ARTHROPLASTY;  Surgeon: Hiram Gash, MD;  Location: [place];  Service: Orthopedics;  Laterality: Right;   TUBAL LIGATION     Social History   Socioeconomic History   Marital status: Married    Spouse name: [name]   Number of children: 2   Years of education: 14   Highest education level: Associate degree: academic program  Occupational History   Occupation: Marine scientist, [employer]   Tobacco Use   Smoking status: Never   Smokeless tobacco: Never  Vaping Use   Vaping Use: Never used  Substance and Sexual Activity   Alcohol use: Yes    Comment: occasional   Drug use: No   Sexual activity: Yes    Birth control/protection: Surgical    Comment: hyst  Other Topics Concern   Not on file  Social History Narrative   Lives  with her husband. She enjoys playing golf, bowling, softball and reading.    Social Determinants of Health   Financial Resource Strain: Low Risk  (06/22/2021)   Overall Financial Resource Strain (CARDIA)    Difficulty of Paying Living Expenses: Not hard at all  Food Insecurity: No Food Insecurity (06/22/2021)   Hunger Vital Sign    Worried About Running Out of Food in the Last Year: Never true    Ran Out of Food in the Last Year: Never true  Transportation Needs: No Transportation Needs (06/22/2021)   PRAPARE - Hydrologist (Medical): No    Lack of Transportation (Non-Medical): No  Physical Activity: Sufficiently Active (06/22/2021)   Exercise Vital Sign    Days of Exercise per Week: 3 days    Minutes of Exercise per  Session: 60 min  Stress: No Stress Concern Present (06/22/2021)   [place]    Feeling of Stress : Not at all  Social Connections: [place] (06/22/2021)   Social Connection and Isolation Panel [NHANES]    Frequency of Communication with Friends and Family: Twice a week    Frequency of Social Gatherings with Friends and Family: Once a week    Attends Religious Services: More than 4 times per year    Active Member of [employer] or Organizations: Yes    Attends Archivist Meetings: Never    Marital Status: Married   Family History  Problem Relation Age of Onset   Heart attack Father 74       CABG 4-vessel   Diabetes Father        late onset   Lung cancer Mother        former smoker   Diabetes Mother        late onset   Breast cancer Mother    Obesity Sister    Stroke Paternal Grandfather 26   Lung cancer Maternal Grandfather        former smoker   Breast cancer Maternal Aunt    Heart attack Paternal Grandmother 80   Diabetes type I Other        grand daughter   Allergies  Allergen Reactions   Hydrochlorothiazide W-Triamterene Other (See Comments)    hypokalemia   Tramadol Itching   Povidone-Iodine Itching    Intolerance, if exposed over a long period.  Ok if used and washed off quickly.   Prior to Admission medications   Medication Sig Start Date End Date Taking? Authorizing Provider  calcium carbonate (OS-CAL) 600 MG tablet Take 600 mg by mouth 2 (two) times daily with a meal.   Yes [provider]  Cholecalciferol (VITAMIN D3) 125 MCG (5000 UT) CAPS Take 5,000 Units by mouth daily.   Yes [provider]  estradiol (VIVELLE-DOT) 0.1 MG/24HR patch Place 1 patch onto the skin 2 (two) times a week.   Yes [provider]  meloxicam (MOBIC) 15 MG tablet TAKE 1 TABLET DAILY 01/02/22  Yes Silverio Decamp, MD  metoprolol succinate (TOPROL-XL) 25 MG 24 hr tablet TAKE 1  TABLET DAILY 10/07/21  Yes Silverio Decamp, MD  Misc Natural Products (PRO NUTRIENTS FRUIT & VEGGIE PO) Take by mouth.   Yes [provider]  omeprazole (PRILOSEC) 20 MG capsule Take 1 capsule (20 mg total) by mouth daily. 30 days for gastroprotection while taking NSAIDs. 02/18/20  Yes Tenise Stetler, Maylene Roes, PA-C  pravastatin (PRAVACHOL) 40 MG tablet TAKE 1  TABLET DAILY 04/07/21  Yes Silverio Decamp, MD  spironolactone (ALDACTONE) 25 MG tablet TAKE 1 TABLET TWICE A DAY 01/26/22  Yes Silverio Decamp, MD  famotidine (PEPCID AC) 10 MG chewable tablet Chew 10 mg by mouth as needed.    [provider]  tiZANidine (ZANAFLEX) 4 MG tablet TAKE 1 TABLET EVERY 6 HOURSAS NEEDED FOR MUSCLE SPASMS 02/08/22   Silverio Decamp, MD  Triamcinolone Acetonide (TRIAMCINOLONE 0.1 % CREAM : EUCERIN) CREA Apply 1 application topically 2 (two) times daily. 10/21/19   Silverio Decamp, MD  zolpidem (AMBIEN) 5 MG tablet TAKE ONE TABLET BY MOUTH EVERY 3RD NIGHT PRN AT [facility] Patient taking differently: Take 5 mg by mouth at bedtime as needed. 01/16/14   Hendricks Limes, MD    ROS: All other systems have been reviewed and were otherwise negative with the exception of those mentioned in the HPI and as above.  Physical Exam: General: Alert, no acute distress Cardiovascular: No pedal edema Respiratory: No cyanosis, no use of accessory musculature GI: No organomegaly, abdomen is soft and non-tender Skin: No lesions in the area of chief complaint Neurologic: Sensation intact distally Psychiatric: Patient is competent for consent with normal mood and affect Lymphatic: No axillary or cervical lymphadenopathy  MUSCULOSKELETAL:  The left shoulder shows an active forward elevation to 150, external rotation to 45, internal rotation to T8. Right shoulder 170 forward elevation, external rotation to 70, internal rotation to T8.  Imaging: X-rays were reviewed from previous. The left  shoulder demonstrates end stage bone on bone glenohumeral arthritis.   BMI: Estimated body mass index is 29.58 kg/m as calculated from the following:   Height as of this encounter: '5\' 3"'$  (1.6 m).   Weight as of this encounter: 75.8 kg.  Lab Results  Component Value Date   ALBUMIN 4.2 05/26/2015   Diabetes: Patient does not have a diagnosis of diabetes.     Smoking Status:   reports that she has never smoked. She has never used smokeless tobacco.     Assessment: LEFT shoulder djd  Plan: Plan for Procedure(s): TOTAL SHOULDER ARTHROPLASTY  The risks benefits and alternatives were discussed with the patient including but not limited to the risks of nonoperative treatment, versus surgical intervention including infection, bleeding, nerve injury,  blood clots, cardiopulmonary complications, morbidity, mortality, among others, and they were willing to proceed.   We additionally specifically discussed risks of axillary nerve injury, infection, periprosthetic fracture, continued pain and longevity of implants prior to beginning procedure.    Patient will be closely monitored in PACU for medical stabilization and pain control. If found stable in PACU, patient may be discharged home with outpatient follow-up. If any concerns regarding patient's stabilization patient will be admitted for observation after surgery. The patient is planning to be discharged home with outpatient PT.   The patient acknowledged the explanation, agreed to proceed with the plan and consent was signed.   She received clearance from her PCP, Amy. Dianah Field.   Operative Plan: Left anatomic total shoulder arthroplasty Discharge Medications: Tylenol, Celebrex, Oxycodone, Zofran, Omeprazole DVT Prophylaxis: Aspirin Physical Therapy: Outpatient PT Special Discharge needs: [place], PA-C  02/22/2022 4:33 PM

## 2022-02-22 NOTE — Anesthesia Preprocedure Evaluation (Addendum)
Anesthesia Evaluation  Patient identified by MRN, date of birth, ID band Patient awake    Reviewed: Allergy & Precautions, NPO status , Patient's Chart, lab work & pertinent test results  History of Anesthesia Complications (+) PONV and history of anesthetic complications  Airway Mallampati: II  TM Distance: >3 FB Neck ROM: Full    Dental no notable dental hx. (+) Implants, Dental Advisory Given, Teeth Intact   Pulmonary neg pulmonary ROS, neg COPD   Pulmonary exam normal breath sounds clear to auscultation       Cardiovascular hypertension, Normal cardiovascular exam Rhythm:Regular Rate:Normal     Neuro/Psych  Neuromuscular disease    GI/Hepatic Neg liver ROS,GERD  ,,  Endo/Other    Renal/GU Lab Results      Component                Value               Date                      CREATININE               0.92                02/20/2022                BUN                      10                  02/20/2022                NA                       136                 02/20/2022                K                        4.8                 02/20/2022                CL                       105                 02/20/2022                CO2                      26                  02/20/2022                Musculoskeletal  (+) Arthritis , Osteoarthritis,    Abdominal   Peds  Hematology   Anesthesia Other Findings URK:YHCW, Tramadol  Reproductive/Obstetrics                              Anesthesia Physical Anesthesia Plan  ASA: 2  Anesthesia Plan: Regional and General   Post-op Pain Management: Regional block* and Minimal or no pain anticipated   Induction: Intravenous  PONV Risk Score  and Plan: 4 or greater and Treatment may vary due to age or medical condition, Midazolam, Ondansetron, Dexamethasone and Scopolamine patch - Pre-op  Airway Management Planned: Oral ETT  Additional  Equipment: None  Intra-op Plan:   Post-operative Plan: Extubation in OR  Informed Consent: I have reviewed the patients History and Physical, chart, labs and discussed the procedure including the risks, benefits and alternatives for the proposed anesthesia with the patient or authorized representative who has indicated his/her understanding and acceptance.     Dental advisory given  Plan Discussed with: CRNA and Anesthesiologist  Anesthesia Plan Comments: (GA w L ISB w Exparel)        Anesthesia Quick Evaluation

## 2022-02-23 ENCOUNTER — Ambulatory Visit (HOSPITAL_COMMUNITY): Payer: Medicare HMO

## 2022-02-23 ENCOUNTER — Other Ambulatory Visit: Payer: Self-pay

## 2022-02-23 ENCOUNTER — Ambulatory Visit (HOSPITAL_BASED_OUTPATIENT_CLINIC_OR_DEPARTMENT_OTHER)
Admission: RE | Admit: 2022-02-23 | Discharge: 2022-02-23 | Disposition: A | Discharge status: Home / Self Care | Payer: Medicare HMO | Attending: Orthopaedic Surgery | Admitting: Orthopaedic Surgery

## 2022-02-23 ENCOUNTER — Encounter (HOSPITAL_BASED_OUTPATIENT_CLINIC_OR_DEPARTMENT_OTHER): Payer: Self-pay | Admitting: Orthopaedic Surgery

## 2022-02-23 ENCOUNTER — Ambulatory Visit (HOSPITAL_BASED_OUTPATIENT_CLINIC_OR_DEPARTMENT_OTHER): Payer: Medicare HMO | Admitting: Anesthesiology

## 2022-02-23 ENCOUNTER — Encounter (HOSPITAL_BASED_OUTPATIENT_CLINIC_OR_DEPARTMENT_OTHER)
Admission: RE | Disposition: A | Discharge status: Home / Self Care | Payer: Self-pay | Source: Home / Self Care | Attending: Orthopaedic Surgery

## 2022-02-23 DIAGNOSIS — E785 Hyperlipidemia, unspecified: Secondary | ICD-10-CM | POA: Diagnosis not present

## 2022-02-23 DIAGNOSIS — M19012 Primary osteoarthritis, left shoulder: Secondary | ICD-10-CM

## 2022-02-23 DIAGNOSIS — J9811 Atelectasis: Secondary | ICD-10-CM | POA: Diagnosis not present

## 2022-02-23 DIAGNOSIS — G8918 Other acute postprocedural pain: Secondary | ICD-10-CM | POA: Diagnosis not present

## 2022-02-23 DIAGNOSIS — K219 Gastro-esophageal reflux disease without esophagitis: Secondary | ICD-10-CM | POA: Insufficient documentation

## 2022-02-23 DIAGNOSIS — Z01818 Encounter for other preprocedural examination: Secondary | ICD-10-CM

## 2022-02-23 DIAGNOSIS — I10 Essential (primary) hypertension: Secondary | ICD-10-CM | POA: Insufficient documentation

## 2022-02-23 DIAGNOSIS — Z96612 Presence of left artificial shoulder joint: Secondary | ICD-10-CM | POA: Diagnosis not present

## 2022-02-23 DIAGNOSIS — Z471 Aftercare following joint replacement surgery: Secondary | ICD-10-CM | POA: Diagnosis not present

## 2022-02-23 HISTORY — PX: TOTAL SHOULDER ARTHROPLASTY: SHX126

## 2022-02-23 SURGERY — ARTHROPLASTY, SHOULDER, TOTAL
Anesthesia: Regional | Site: Shoulder | Laterality: Left

## 2022-02-23 MED ORDER — BUPIVACAINE HCL (PF) 0.5 % IJ SOLN
INTRAMUSCULAR | Status: DC | PRN
  Administered 2022-02-23: 14 mL via PERINEURAL

## 2022-02-23 MED ORDER — HYDROMORPHONE HCL 1 MG/ML IJ SOLN
INTRAMUSCULAR | Status: AC
  Filled 2022-02-23: qty 0.5

## 2022-02-23 MED ORDER — VANCOMYCIN HCL 1000 MG IV SOLR
INTRAVENOUS | Status: AC
  Filled 2022-02-23: qty 20

## 2022-02-23 MED ORDER — TRANEXAMIC ACID-NACL 1000-0.7 MG/100ML-% IV SOLN
1000.0000 mg | INTRAVENOUS | Status: AC
  Administered 2022-02-23: 1000 mg via INTRAVENOUS

## 2022-02-23 MED ORDER — CEFAZOLIN SODIUM-DEXTROSE 2-4 GM/100ML-% IV SOLN
2.0000 g | INTRAVENOUS | Status: AC
  Administered 2022-02-23: 2 g via INTRAVENOUS

## 2022-02-23 MED ORDER — FENTANYL CITRATE (PF) 100 MCG/2ML IJ SOLN
INTRAMUSCULAR | Status: DC | PRN
  Administered 2022-02-23 (×2): 50 ug via INTRAVENOUS

## 2022-02-23 MED ORDER — BUPIVACAINE LIPOSOME 1.3 % IJ SUSP
INTRAMUSCULAR | Status: DC | PRN
  Administered 2022-02-23: 10 mL via PERINEURAL

## 2022-02-23 MED ORDER — MELOXICAM 15 MG PO TABS: 15.0000 mg | ORAL_TABLET | Freq: Every day | ORAL | 0 refills | Status: DC

## 2022-02-23 MED ORDER — ACETAMINOPHEN 500 MG PO TABS
ORAL_TABLET | ORAL | Status: AC
  Filled 2022-02-23: qty 2

## 2022-02-23 MED ORDER — FENTANYL CITRATE (PF) 100 MCG/2ML IJ SOLN
100.0000 ug | Freq: Once | INTRAMUSCULAR | Status: AC
  Administered 2022-02-23: 50 ug via INTRAVENOUS

## 2022-02-23 MED ORDER — ATROPINE SULFATE 0.4 MG/ML IV SOLN
INTRAVENOUS | Status: AC
  Filled 2022-02-23: qty 1

## 2022-02-23 MED ORDER — ONDANSETRON HCL 4 MG/2ML IJ SOLN: 4.0000 mg | Freq: Once | INTRAMUSCULAR | Status: DC | PRN

## 2022-02-23 MED ORDER — ACETAMINOPHEN 500 MG PO TABS: 1000.0000 mg | ORAL_TABLET | Freq: Three times a day (TID) | ORAL | 0 refills | Status: AC

## 2022-02-23 MED ORDER — PROPOFOL 10 MG/ML IV BOLUS
INTRAVENOUS | Status: AC
  Filled 2022-02-23: qty 20

## 2022-02-23 MED ORDER — ACETAMINOPHEN 10 MG/ML IV SOLN: 1000.0000 mg | Freq: Once | INTRAVENOUS | Status: DC | PRN

## 2022-02-23 MED ORDER — TRANEXAMIC ACID-NACL 1000-0.7 MG/100ML-% IV SOLN
INTRAVENOUS | Status: AC
  Filled 2022-02-23: qty 100

## 2022-02-23 MED ORDER — HYDROMORPHONE HCL 1 MG/ML IJ SOLN
0.2500 mg | INTRAMUSCULAR | Status: DC | PRN
  Administered 2022-02-23 (×2): 0.5 mg via INTRAVENOUS

## 2022-02-23 MED ORDER — DEXAMETHASONE SODIUM PHOSPHATE 10 MG/ML IJ SOLN
INTRAMUSCULAR | Status: AC
  Filled 2022-02-23: qty 1

## 2022-02-23 MED ORDER — ROCURONIUM BROMIDE 10 MG/ML (PF) SYRINGE
PREFILLED_SYRINGE | INTRAVENOUS | Status: AC
  Filled 2022-02-23: qty 10

## 2022-02-23 MED ORDER — 0.9 % SODIUM CHLORIDE (POUR BTL) OPTIME
TOPICAL | Status: DC | PRN
  Administered 2022-02-23: 1000 mL

## 2022-02-23 MED ORDER — ROCURONIUM BROMIDE 100 MG/10ML IV SOLN
INTRAVENOUS | Status: DC | PRN
  Administered 2022-02-23: 50 mg via INTRAVENOUS

## 2022-02-23 MED ORDER — LACTATED RINGERS IV BOLUS
500.0000 mL | Freq: Once | INTRAVENOUS | Status: AC
  Administered 2022-02-23: 500 mL via INTRAVENOUS

## 2022-02-23 MED ORDER — AMISULPRIDE (ANTIEMETIC) 5 MG/2ML IV SOLN: 10.0000 mg | Freq: Once | INTRAVENOUS | Status: DC | PRN

## 2022-02-23 MED ORDER — GABAPENTIN 300 MG PO CAPS
300.0000 mg | ORAL_CAPSULE | Freq: Once | ORAL | Status: AC
  Administered 2022-02-23: 300 mg via ORAL

## 2022-02-23 MED ORDER — OXYCODONE HCL 5 MG PO TABS: ORAL_TABLET | ORAL | 0 refills | Status: AC

## 2022-02-23 MED ORDER — FENTANYL CITRATE (PF) 100 MCG/2ML IJ SOLN
INTRAMUSCULAR | Status: AC
  Filled 2022-02-23: qty 2

## 2022-02-23 MED ORDER — SCOPOLAMINE 1 MG/3DAYS TD PT72
1.0000 | MEDICATED_PATCH | TRANSDERMAL | Status: DC
  Administered 2022-02-23: 1.5 mg via TRANSDERMAL

## 2022-02-23 MED ORDER — ASPIRIN 81 MG PO CHEW: 81.0000 mg | CHEWABLE_TABLET | Freq: Two times a day (BID) | ORAL | 0 refills | Status: AC

## 2022-02-23 MED ORDER — MIDAZOLAM HCL 2 MG/2ML IJ SOLN
2.0000 mg | Freq: Once | INTRAMUSCULAR | Status: AC
  Administered 2022-02-23: 2 mg via INTRAVENOUS

## 2022-02-23 MED ORDER — ONDANSETRON HCL 4 MG PO TABS: 4.0000 mg | ORAL_TABLET | Freq: Three times a day (TID) | ORAL | 0 refills | Status: AC | PRN

## 2022-02-23 MED ORDER — VANCOMYCIN HCL 1000 MG IV SOLR
INTRAVENOUS | Status: DC | PRN
  Administered 2022-02-23: 1000 mg via TOPICAL

## 2022-02-23 MED ORDER — ONDANSETRON HCL 4 MG/2ML IJ SOLN
INTRAMUSCULAR | Status: DC | PRN
  Administered 2022-02-23: 4 mg via INTRAVENOUS

## 2022-02-23 MED ORDER — POVIDONE-IODINE 10 % EX SOLN: Freq: Once | CUTANEOUS | Status: DC

## 2022-02-23 MED ORDER — LACTATED RINGERS IV BOLUS: 250.0000 mL | Freq: Once | INTRAVENOUS | Status: DC

## 2022-02-23 MED ORDER — OMEPRAZOLE 20 MG PO CPDR: 20.0000 mg | DELAYED_RELEASE_CAPSULE | Freq: Every day | ORAL | 0 refills | Status: DC

## 2022-02-23 MED ORDER — BUPIVACAINE HCL (PF) 0.25 % IJ SOLN
INTRAMUSCULAR | Status: AC
  Filled 2022-02-23: qty 30

## 2022-02-23 MED ORDER — PHENYLEPHRINE HCL (PRESSORS) 10 MG/ML IV SOLN
INTRAVENOUS | Status: AC
  Filled 2022-02-23: qty 1

## 2022-02-23 MED ORDER — CEFAZOLIN SODIUM-DEXTROSE 2-4 GM/100ML-% IV SOLN
INTRAVENOUS | Status: AC
  Filled 2022-02-23: qty 100

## 2022-02-23 MED ORDER — OXYCODONE HCL 5 MG PO TABS
ORAL_TABLET | ORAL | Status: AC
  Filled 2022-02-23: qty 1

## 2022-02-23 MED ORDER — ACETAMINOPHEN 500 MG PO TABS
1000.0000 mg | ORAL_TABLET | Freq: Once | ORAL | Status: AC
  Administered 2022-02-23: 1000 mg via ORAL

## 2022-02-23 MED ORDER — DEXAMETHASONE SODIUM PHOSPHATE 10 MG/ML IJ SOLN
INTRAMUSCULAR | Status: DC | PRN
  Administered 2022-02-23: 10 mg via INTRAVENOUS

## 2022-02-23 MED ORDER — OXYCODONE HCL 5 MG PO TABS
5.0000 mg | ORAL_TABLET | Freq: Once | ORAL | Status: AC | PRN
  Administered 2022-02-23: 5 mg via ORAL

## 2022-02-23 MED ORDER — EPHEDRINE 5 MG/ML INJ
INTRAVENOUS | Status: AC
  Filled 2022-02-23: qty 5

## 2022-02-23 MED ORDER — VANCOMYCIN HCL IN DEXTROSE 1-5 GM/200ML-% IV SOLN: 1000.0000 mg | INTRAVENOUS | Status: DC

## 2022-02-23 MED ORDER — GABAPENTIN 300 MG PO CAPS
ORAL_CAPSULE | ORAL | Status: AC
  Filled 2022-02-23: qty 1

## 2022-02-23 MED ORDER — LIDOCAINE 2% (20 MG/ML) 5 ML SYRINGE
INTRAMUSCULAR | Status: DC | PRN
  Administered 2022-02-23: 100 mg via INTRAVENOUS

## 2022-02-23 MED ORDER — LIDOCAINE 2% (20 MG/ML) 5 ML SYRINGE
INTRAMUSCULAR | Status: AC
  Filled 2022-02-23: qty 5

## 2022-02-23 MED ORDER — SUGAMMADEX SODIUM 200 MG/2ML IV SOLN
INTRAVENOUS | Status: DC | PRN
  Administered 2022-02-23: 200 mg via INTRAVENOUS

## 2022-02-23 MED ORDER — OXYCODONE HCL 5 MG/5ML PO SOLN: 5.0000 mg | Freq: Once | ORAL | Status: AC | PRN

## 2022-02-23 MED ORDER — LACTATED RINGERS IV SOLN: INTRAVENOUS | Status: DC

## 2022-02-23 MED ORDER — ONDANSETRON HCL 4 MG/2ML IJ SOLN
INTRAMUSCULAR | Status: AC
  Filled 2022-02-23: qty 2

## 2022-02-23 MED ORDER — PROPOFOL 10 MG/ML IV BOLUS
INTRAVENOUS | Status: DC | PRN
  Administered 2022-02-23: 140 mg via INTRAVENOUS

## 2022-02-23 MED ORDER — EPHEDRINE SULFATE (PRESSORS) 50 MG/ML IJ SOLN
INTRAMUSCULAR | Status: DC | PRN
  Administered 2022-02-23 (×5): 5 mg via INTRAVENOUS

## 2022-02-23 MED ORDER — SCOPOLAMINE 1 MG/3DAYS TD PT72
MEDICATED_PATCH | TRANSDERMAL | Status: AC
  Filled 2022-02-23: qty 1

## 2022-02-23 MED ORDER — MIDAZOLAM HCL 2 MG/2ML IJ SOLN
INTRAMUSCULAR | Status: AC
  Filled 2022-02-23: qty 2

## 2022-02-23 SURGICAL SUPPLY — 65 items
AID PSTN UNV HD RSTRNT DISP (MISCELLANEOUS) ×1
APL PRP STRL LF DISP 70% ISPRP (MISCELLANEOUS) ×1
BLADE SAW SGTL 73X25 THK (BLADE) ×1 IMPLANT
BLADE SURG 10 STRL SS (BLADE) IMPLANT
BLADE SURG 15 STRL LF DISP TIS (BLADE) IMPLANT
BLADE SURG 15 STRL SS (BLADE)
CEMENT BONE DEPUY (Cement) ×1 IMPLANT
CHLORAPREP W/TINT 26 (MISCELLANEOUS) ×1 IMPLANT
CLSR STERI-STRIP ANTIMIC 1/2X4 (GAUZE/BANDAGES/DRESSINGS) ×1 IMPLANT
COMPONENT SZ1 NUCLEUS SHLDR (Miscellaneous) IMPLANT
COOLER ICEMAN CLASSIC (MISCELLANEOUS) ×1 IMPLANT
COVER BACK TABLE 60X90IN (DRAPES) ×1 IMPLANT
COVER MAYO STAND STRL (DRAPES) ×1 IMPLANT
DRAPE IMP U-DRAPE 54X76 (DRAPES) IMPLANT
DRAPE INCISE IOBAN 66X45 STRL (DRAPES) ×1 IMPLANT
DRAPE POUCH INSTRU U-SHP 10X18 (DRAPES) ×1 IMPLANT
DRAPE U-SHAPE 76X120 STRL (DRAPES) ×2 IMPLANT
DRSG AQUACEL AG ADV 3.5X 6 (GAUZE/BANDAGES/DRESSINGS) ×1 IMPLANT
ELECT BLADE 4.0 EZ CLEAN MEGAD (MISCELLANEOUS) ×1
ELECT REM PT RETURN 9FT ADLT (ELECTROSURGICAL) ×1
ELECTRODE BLDE 4.0 EZ CLN MEGD (MISCELLANEOUS) ×1 IMPLANT
ELECTRODE REM PT RTRN 9FT ADLT (ELECTROSURGICAL) ×1 IMPLANT
FACESHIELD WRAPAROUND (MASK) ×2 IMPLANT
FACESHIELD WRAPAROUND OR TEAM (MASK) ×2 IMPLANT
GLENOID CORTILOC PEGGED S35 (Joint) IMPLANT
GLOVE BIO SURGEON STRL SZ 6.5 (GLOVE) ×2 IMPLANT
GLOVE BIOGEL PI IND STRL 6.5 (GLOVE) ×1 IMPLANT
GLOVE BIOGEL PI IND STRL 8 (GLOVE) ×1 IMPLANT
GLOVE ECLIPSE 8.0 STRL XLNG CF (GLOVE) ×3 IMPLANT
GOWN STRL REUS W/ TWL LRG LVL3 (GOWN DISPOSABLE) ×2 IMPLANT
GOWN STRL REUS W/TWL LRG LVL3 (GOWN DISPOSABLE) ×2
GOWN STRL REUS W/TWL XL LVL3 (GOWN DISPOSABLE) ×1 IMPLANT
GUIDE PIN 3X75 SHOULDER (PIN) ×2
GUIDEWIRE GLENOID 2.5X220 (WIRE) ×1 IMPLANT
HANDPIECE INTERPULSE COAX TIP (DISPOSABLE) ×1
IMPL HUMERAL HEAD 41X15 (Orthopedic Implant) IMPLANT
IMPLANT HUMERAL HEAD 41X15 (Orthopedic Implant) ×1 IMPLANT
KIT STABILIZATION SHOULDER (MISCELLANEOUS) ×1 IMPLANT
MANIFOLD NEPTUNE II (INSTRUMENTS) ×1 IMPLANT
NDL MAYO TROCAR (NEEDLE) ×1 IMPLANT
NEEDLE MAYO TROCAR (NEEDLE) ×1 IMPLANT
NS IRRIG 1000ML POUR BTL (IV SOLUTION) ×1 IMPLANT
PACK BASIN DAY SURGERY FS (CUSTOM PROCEDURE TRAY) ×1 IMPLANT
PACK SHOULDER (CUSTOM PROCEDURE TRAY) ×1 IMPLANT
PAD COLD SHLDR WRAP-ON (PAD) ×1 IMPLANT
PENCIL SMOKE EVACUATOR (MISCELLANEOUS) IMPLANT
PIN GUIDE 3X75 SHOULDER (PIN) ×1 IMPLANT
RESTRAINT HEAD UNIVERSAL NS (MISCELLANEOUS) ×1 IMPLANT
SET HNDPC FAN SPRY TIP SCT (DISPOSABLE) ×1 IMPLANT
SHEET MEDIUM DRAPE 40X70 STRL (DRAPES) ×1 IMPLANT
SLEEVE SCD COMPRESS KNEE MED (STOCKING) ×1 IMPLANT
SMARTMIX MINI TOWER (MISCELLANEOUS) ×1
SPONGE T-LAP 18X18 ~~LOC~~+RFID (SPONGE) IMPLANT
SUT ETHIBOND 2 V 37 (SUTURE) ×1 IMPLANT
SUT ETHIBOND NAB CT1 #1 30IN (SUTURE) ×1 IMPLANT
SUT FIBERWIRE #2 38 REV NDL BL (SUTURE)
SUT FIBERWIRE #5 38 CONV NDL (SUTURE) ×6
SUT MNCRL AB 4-0 PS2 18 (SUTURE) ×1 IMPLANT
SUT VIC AB 3-0 SH 27 (SUTURE) ×1
SUT VIC AB 3-0 SH 27X BRD (SUTURE) ×1 IMPLANT
SUTURE FIBERWR #5 38 CONV NDL (SUTURE) ×6 IMPLANT
SUTURE FIBERWR#2 38 REV NDL BL (SUTURE) IMPLANT
TOWEL GREEN STERILE FF (TOWEL DISPOSABLE) ×3 IMPLANT
TOWER SMARTMIX MINI (MISCELLANEOUS) ×1 IMPLANT
TUBE SUCTION HIGH CAP CLEAR NV (SUCTIONS) ×1 IMPLANT

## 2022-02-23 NOTE — Transfer of Care (Signed)
Immediate Anesthesia Transfer of Care Note  Patient: [name]  Procedure(s) Performed: TOTAL SHOULDER ARTHROPLASTY (Left: Shoulder)  Patient Location: PACU  Anesthesia Type:GA combined with regional for post-op pain  Level of Consciousness: drowsy  Airway & Oxygen Therapy: Patient Spontanous Breathing and Patient connected to face mask oxygen  Post-op Assessment: Report given to RN and Post -op Vital signs reviewed and stable  Post vital signs: Reviewed and stable  Last Vitals:  Vitals Value Taken Time  BP 157/84 02/23/22 0853  Temp 36.4 C 02/23/22 0853  Pulse 70 02/23/22 0854  Resp 9 02/23/22 0854  SpO2 99 % 02/23/22 0854  Vitals shown include unvalidated device data.  Last Pain:  Vitals:   02/23/22 0628  TempSrc: Temporal  PainSc: 0-No pain      Patients Stated Pain Goal: 3 (72/76/18 4859)  Complications: No notable events documented.

## 2022-02-23 NOTE — Anesthesia Postprocedure Evaluation (Signed)
Anesthesia Post Note  Patient: CALIANNA KIM  Procedure(s) Performed: TOTAL SHOULDER ARTHROPLASTY (Left: Shoulder)     Patient location during evaluation: PACU Anesthesia Type: Regional and General Level of consciousness: awake and alert Pain management: pain level controlled Vital Signs Assessment: post-procedure vital signs reviewed and stable Respiratory status: spontaneous breathing, nonlabored ventilation, respiratory function stable and patient connected to nasal cannula oxygen Cardiovascular status: blood pressure returned to baseline and stable Postop Assessment: no apparent nausea or vomiting Anesthetic complications: no  No notable events documented.  Last Vitals:  Vitals:   02/23/22 0946 02/23/22 1000  BP: (!) 150/82 (!) 154/76  Pulse: 73 67  Resp: (!) 23 18  Temp:  36.4 C  SpO2: 95% 96%    Last Pain:  Vitals:   02/23/22 1000  TempSrc:   PainSc: 3                  Barnet Glasgow

## 2022-02-23 NOTE — Interval H&P Note (Signed)
All questions answered, patient wants to proceed with procedure. ? ?

## 2022-02-23 NOTE — Anesthesia Procedure Notes (Signed)
Anesthesia Regional Block: Interscalene brachial plexus block   Pre-Anesthetic Checklist: , timeout performed,  Correct Patient, Correct Site, Correct Laterality,  Correct Procedure, Correct Position, site marked,  Risks and benefits discussed,  Surgical consent,  Pre-op evaluation,  At surgeon's request and post-op pain management  Laterality: Upper and Left  Prep: Maximum Sterile Barrier Precautions used, chloraprep       Needles:  Injection technique: Single-shot  Needle Type: Echogenic Needle     Needle Length: 5cm  Needle Gauge: 21     Additional Needles:   Procedures:,,,, ultrasound used (permanent image in chart),,    Narrative:  Start time: 02/23/2022 7:07 AM End time: 02/23/2022 7:14 AM Injection made incrementally with aspirations every 5 mL.  Performed by: Personally  Anesthesiologist: Barnet Glasgow, MD  Additional Notes: Block assessed prior to procedure. Patient tolerated procedure well.

## 2022-02-23 NOTE — Op Note (Signed)
Orthopaedic Surgery Operative Note (CSN: [id number])  [name]  1950-04-04 Date of Surgery: 02/23/2022   Diagnoses:  Left primary glenohumeral arthritis  Procedure: Left anatomic stemless total Shoulder Arthroplasty   Operative Finding Successful completion of planned procedure.  Much like the patient's contralateral shoulder she had increased retroversion compared to a typical but we cut her at her native retroversion.  Her bone quality was moderate to poor and she had a cyst at the inferior aspect of the humeral neck which we packed.  There was healthy superior cuff and the subscapularis was intact but was relatively thin compared to previous.  Post-operative plan: The patient will be NWB in sling.  The patient will be will be discharged from PACU if continues to be stable as was plan prior to surgery.  DVT prophylaxis Aspirin 81 mg twice daily for 6 weeks.  Pain control with PRN pain medication preferring oral medicines.  Follow up plan will be scheduled in approximately 7 days for incision check and XR.  Physical therapy to start next week.  Implants: Tornier size 1 nucleus, 41 head, small 35 glenoid  Post-Op Diagnosis: Same Surgeons:Primary: Hiram Gash, MD Assistants:Caroline McBane PA-C Location: [facility] OR ROOM 1 Anesthesia: General with Exparel Interscalene Antibiotics: Ancef 2g preop, Vancomycin '1000mg'$  locally Tourniquet time: None Estimated Blood Loss: 151 Complications: None Specimens: None Implants: Implant Name Type Inv. Item Serial No. Manufacturer Lot No. LRB No. Used Action  CEMENT BONE DEPUY - [id number] Cement CEMENT BONE DEPUY  DEPUY ORTHOPAEDICS [id number] Left 1 Implanted  GLENOID CORTILOC PEGGED S35 - [id number] Joint GLENOID CORTILOC PEGGED S35 [id number] [employer]  Left 1 Implanted  IMPLANT HUMERAL HEAD 41X15 - [id number] Orthopedic Implant IMPLANT HUMERAL HEAD 41X15 [id number] [employer]  Left 1 Implanted  COMPONENT SZ1 NUCLEUS SHLDR - [id number]  Miscellaneous COMPONENT SZ1 NUCLEUS SHLDR [id number] [employer]  Left 1 Implanted    Indications for Surgery:   [name] is a 71 y.o. female with end-stage arthritis of the joint.  Benefits and risks of operative and nonoperative management were discussed prior to surgery with patient/guardian(s) and informed consent form was completed.  Infection and need for further surgery were discussed as was prosthetic stability and cuff issues.  We additionally specifically discussed risks of axillary nerve injury, infection, periprosthetic fracture, continued pain and longevity of implants prior to beginning procedure.      Procedure:   The patient was identified in the preoperative holding area where the surgical site was marked. Block placed by anesthesia with exparel.  The patient was taken to the OR where a procedural timeout was called and the above noted anesthesia was induced.  The patient was positioned beachchair on allen table with spider arm positioner.  Preoperative antibiotics were dosed.  The patient's left shoulder was prepped and draped in the usual sterile fashion.  A second preoperative timeout was called.       Standard deltopectoral approach was performed with a #10 blade. We dissected down to the subcutaneous tissues and the cephalic vein was taken laterally with the deltoid. Clavipectoral fascia was incised in line with the incision. Deep retractors were placed. The long of the biceps tendon was identified and there was significant tenosynovitis present.  Tenodesis was performed to the pectoralis tendon with #2 Ethibond. The remaining biceps was followed up into the rotator interval where it was released. The subscapularis was taken down with a lesser tuberosity osteotomy using an osteotome. The osteotomy fragment and underlying capsular elevated off  of the humeral neck and the osteophytes inferiorly. #2 Ethibond sutures are passed through the bone tendon junction for subscap  manipulation. We continued releasing the capsule directly off of the osteophytes inferiorly all the way around the corner. This allowed [place] to dislocate the humeral head. The humeral head had evidence of severe osteoarthritic wear with full-thickness cartilage loss and exposed subchondral bone. There was significant flattening of the humeral head.   The rotator cuff was carefully examined and noted to be intact without sign of wear.  The decision was confirmed that an anatomic total shoulder was indicated for this patient.  There were osteophytes along the inferior humeral neck. The osteophytes were removed with an osteotome and a rongeur.  Osteophytes were removed with a rongeur and an osteotome and the anatomic neck was well visualized.   We next made our humeral osteotomy with an oscillating saw along the anatomic neck. The head fragment was passed off the back table and measured approximately 41 mm in diameter.   Bone quality was reasonable and we decided that it was appropriate to use simpliciti stemless implants and placed a guidepin perpendicular to our cut using a guide.  This obtained bicortical purchase.  We then reamed off of this to a flat surface and drilled and placed our nucleus.  A cut protector was placed.  The subscapularis was again identified and immediately we took care to palpate the axillary nerve anteriorly and verify its position with gentle palpation as well as the tug test.  We then released the SGHL with bovie cautery prior to placing a curved mayo at the junction of the anterior glenoid well above the axillary nerve and bluntly dissecting the subscapularis from the capsule.  We then carefully protected the axillary nerve as we gently released the inferior capsule to fully mobilize the subscapularis.  An anterior deltoid retractor was then placed as well as a small Hohmann retractor superiorly.    The glenoid was inspected and had evidence of severe osteoarthritic wear with  full-thickness cartilage loss and exposed subchondral bone. The remaining labrum was removed circumferentially taking great care not to disrupt the posterior capsule. The glenoid was sized as listed in the implants above. The center hole was marked and we used a glenoid drill guide to place a guide pin in the center position.  The glenoid was reamed concentrically over the guide pin. Next the center hole was enlarged and the drill guide for the peripheral pegs was placed. The vault was intact. The peripheral pegs were drilled in standard fashion. A trial glenoid was placed and was stable. We removed the trial components.   The glenoid bone was prepared with pulsatile lavage and a sponge to dry the bone prior to cement application. Cement was mixed on the back table the peripheral peg holes were cemented. The glenoid was impacted securely.   We turned attention back to the humeral side. The cut protector was removed. We trialed with multiple size head options and selected a 41 which re-created the patient's anatomy. The offset was dialed in to match the normal anatomy. The shoulder was trialed.  There was a cyst in the humeral head that was packed with autograft from the head cut.  There was good ROM in all planes and the shoulder was stable with approximately 50% posterior spring back and no inferior translation.  The real humeral implants were opened on the back table and assembled.  The trial was removed. #5 Fiberwire sutures passed through the humeral  neck for subscap repair. The humeral component was press-fit obtaining a secure fit.  The joint was reduced and thoroughly irrigated with pulsatile lavage. Subscap and lesser tuberosity osteotomy repaired back in a double row fashion with #5 Fiberwire sutures through bone tunnels. Next the rotator interval was closed with #2 ethibond suture. Hemostasis was obtained. The deltopectoral interval was reapproximated with #1 Ethibond. The subcutaneous tissues were  closed with 2-0 Vicryl and the skin was closed with a running monocryl. The incisions were cleaned and dried and an Aquacel dressing was placed. The drapes taken down. The arm was placed into sling with abduction pillow. Patient was awakened, extubated, and transferred to the recovery room in stable condition. There were no intraoperative complications. The sponge, needle, and attention counts were correct at the end of the case.      Noemi Chapel, PA-C, present and scrubbed throughout the case, critical for completion in a timely fashion, and for retraction, instrumentation, closure.

## 2022-02-23 NOTE — Anesthesia Procedure Notes (Signed)
Procedure Name: Intubation Date/Time: 02/23/2022 7:33 AM  Performed by: Lavonia Dana, CRNAPre-anesthesia Checklist: Patient identified, Emergency Drugs available, Suction available and Patient being monitored Patient Re-evaluated:Patient Re-evaluated prior to induction Oxygen Delivery Method: Circle system utilized Preoxygenation: Pre-oxygenation with 100% oxygen Induction Type: IV induction Ventilation: Mask ventilation without difficulty Laryngoscope Size: Mac and 3 Grade View: Grade I Tube type: Oral Tube size: 7.0 mm Number of attempts: 1 Airway Equipment and Method: Stylet and Bite block Placement Confirmation: ETT inserted through vocal cords under direct vision, positive ETCO2 and breath sounds checked- equal and bilateral Secured at: 22 cm Tube secured with: Tape Dental Injury: Teeth and Oropharynx as per pre-operative assessment

## 2022-02-25 ENCOUNTER — Encounter (HOSPITAL_BASED_OUTPATIENT_CLINIC_OR_DEPARTMENT_OTHER): Payer: Self-pay | Admitting: Orthopaedic Surgery

## 2022-02-27 DIAGNOSIS — Z96612 Presence of left artificial shoulder joint: Secondary | ICD-10-CM | POA: Diagnosis not present

## 2022-02-27 DIAGNOSIS — M25512 Pain in left shoulder: Secondary | ICD-10-CM | POA: Diagnosis not present

## 2022-02-27 DIAGNOSIS — Z471 Aftercare following joint replacement surgery: Secondary | ICD-10-CM | POA: Diagnosis not present

## 2022-02-27 DIAGNOSIS — M25612 Stiffness of left shoulder, not elsewhere classified: Secondary | ICD-10-CM | POA: Diagnosis not present

## 2022-02-27 DIAGNOSIS — M6281 Muscle weakness (generalized): Secondary | ICD-10-CM | POA: Diagnosis not present

## 2022-03-03 DIAGNOSIS — M19012 Primary osteoarthritis, left shoulder: Secondary | ICD-10-CM | POA: Diagnosis not present

## 2022-03-06 DIAGNOSIS — M6281 Muscle weakness (generalized): Secondary | ICD-10-CM | POA: Diagnosis not present

## 2022-03-06 DIAGNOSIS — M6289 Other specified disorders of muscle: Secondary | ICD-10-CM | POA: Diagnosis not present

## 2022-03-06 DIAGNOSIS — Z723 Lack of physical exercise: Secondary | ICD-10-CM | POA: Diagnosis not present

## 2022-03-06 DIAGNOSIS — R6889 Other general symptoms and signs: Secondary | ICD-10-CM | POA: Diagnosis not present

## 2022-03-06 DIAGNOSIS — R29898 Other symptoms and signs involving the musculoskeletal system: Secondary | ICD-10-CM | POA: Diagnosis not present

## 2022-03-06 DIAGNOSIS — M25512 Pain in left shoulder: Secondary | ICD-10-CM | POA: Diagnosis not present

## 2022-03-09 ENCOUNTER — Encounter: Payer: Self-pay | Admitting: Medical-Surgical

## 2022-03-10 ENCOUNTER — Other Ambulatory Visit: Payer: Self-pay | Admitting: Sports Medicine

## 2022-03-10 DIAGNOSIS — M25512 Pain in left shoulder: Secondary | ICD-10-CM | POA: Diagnosis not present

## 2022-03-10 DIAGNOSIS — Z723 Lack of physical exercise: Secondary | ICD-10-CM | POA: Diagnosis not present

## 2022-03-10 DIAGNOSIS — M6281 Muscle weakness (generalized): Secondary | ICD-10-CM | POA: Diagnosis not present

## 2022-03-10 DIAGNOSIS — M6289 Other specified disorders of muscle: Secondary | ICD-10-CM | POA: Diagnosis not present

## 2022-03-10 DIAGNOSIS — R6889 Other general symptoms and signs: Secondary | ICD-10-CM | POA: Diagnosis not present

## 2022-03-10 DIAGNOSIS — R29898 Other symptoms and signs involving the musculoskeletal system: Secondary | ICD-10-CM | POA: Diagnosis not present

## 2022-03-14 DIAGNOSIS — R29898 Other symptoms and signs involving the musculoskeletal system: Secondary | ICD-10-CM | POA: Diagnosis not present

## 2022-03-14 DIAGNOSIS — Z723 Lack of physical exercise: Secondary | ICD-10-CM | POA: Diagnosis not present

## 2022-03-14 DIAGNOSIS — M6289 Other specified disorders of muscle: Secondary | ICD-10-CM | POA: Diagnosis not present

## 2022-03-14 DIAGNOSIS — M25512 Pain in left shoulder: Secondary | ICD-10-CM | POA: Diagnosis not present

## 2022-03-14 DIAGNOSIS — R6889 Other general symptoms and signs: Secondary | ICD-10-CM | POA: Diagnosis not present

## 2022-03-14 DIAGNOSIS — M6281 Muscle weakness (generalized): Secondary | ICD-10-CM | POA: Diagnosis not present

## 2022-03-21 DIAGNOSIS — R29898 Other symptoms and signs involving the musculoskeletal system: Secondary | ICD-10-CM | POA: Diagnosis not present

## 2022-03-21 DIAGNOSIS — Z723 Lack of physical exercise: Secondary | ICD-10-CM | POA: Diagnosis not present

## 2022-03-21 DIAGNOSIS — M6289 Other specified disorders of muscle: Secondary | ICD-10-CM | POA: Diagnosis not present

## 2022-03-21 DIAGNOSIS — R6889 Other general symptoms and signs: Secondary | ICD-10-CM | POA: Diagnosis not present

## 2022-03-21 DIAGNOSIS — M6281 Muscle weakness (generalized): Secondary | ICD-10-CM | POA: Diagnosis not present

## 2022-03-21 DIAGNOSIS — M25512 Pain in left shoulder: Secondary | ICD-10-CM | POA: Diagnosis not present

## 2022-03-24 ENCOUNTER — Telehealth: Payer: Self-pay

## 2022-03-24 DIAGNOSIS — M6281 Muscle weakness (generalized): Secondary | ICD-10-CM | POA: Diagnosis not present

## 2022-03-24 DIAGNOSIS — M6289 Other specified disorders of muscle: Secondary | ICD-10-CM | POA: Diagnosis not present

## 2022-03-24 DIAGNOSIS — M19012 Primary osteoarthritis, left shoulder: Secondary | ICD-10-CM | POA: Diagnosis not present

## 2022-03-24 DIAGNOSIS — R29898 Other symptoms and signs involving the musculoskeletal system: Secondary | ICD-10-CM | POA: Diagnosis not present

## 2022-03-24 DIAGNOSIS — Z723 Lack of physical exercise: Secondary | ICD-10-CM | POA: Diagnosis not present

## 2022-03-24 DIAGNOSIS — M25512 Pain in left shoulder: Secondary | ICD-10-CM | POA: Diagnosis not present

## 2022-03-24 DIAGNOSIS — R6889 Other general symptoms and signs: Secondary | ICD-10-CM | POA: Diagnosis not present

## 2022-03-24 MED ORDER — TIZANIDINE HCL 4 MG PO TABS: 4.0000 mg | ORAL_TABLET | Freq: Four times a day (QID) | ORAL | 3 refills | Status: DC | PRN

## 2022-03-24 NOTE — Addendum Note (Signed)
Addended by: Silverio Decamp on: 03/24/2022 02:58 PM   Modules accepted: Orders

## 2022-03-24 NOTE — Telephone Encounter (Signed)
Sent!

## 2022-03-24 NOTE — Telephone Encounter (Signed)
[facility] left a VM and would like for patient to get refill on tizanidine refill please advise

## 2022-03-28 DIAGNOSIS — M6289 Other specified disorders of muscle: Secondary | ICD-10-CM | POA: Diagnosis not present

## 2022-03-28 DIAGNOSIS — R6889 Other general symptoms and signs: Secondary | ICD-10-CM | POA: Diagnosis not present

## 2022-03-28 DIAGNOSIS — R29898 Other symptoms and signs involving the musculoskeletal system: Secondary | ICD-10-CM | POA: Diagnosis not present

## 2022-03-28 DIAGNOSIS — M25512 Pain in left shoulder: Secondary | ICD-10-CM | POA: Diagnosis not present

## 2022-03-28 DIAGNOSIS — Z723 Lack of physical exercise: Secondary | ICD-10-CM | POA: Diagnosis not present

## 2022-03-28 DIAGNOSIS — M6281 Muscle weakness (generalized): Secondary | ICD-10-CM | POA: Diagnosis not present

## 2022-03-31 DIAGNOSIS — Z723 Lack of physical exercise: Secondary | ICD-10-CM | POA: Diagnosis not present

## 2022-03-31 DIAGNOSIS — R29898 Other symptoms and signs involving the musculoskeletal system: Secondary | ICD-10-CM | POA: Diagnosis not present

## 2022-03-31 DIAGNOSIS — R6889 Other general symptoms and signs: Secondary | ICD-10-CM | POA: Diagnosis not present

## 2022-03-31 DIAGNOSIS — M6289 Other specified disorders of muscle: Secondary | ICD-10-CM | POA: Diagnosis not present

## 2022-03-31 DIAGNOSIS — M6281 Muscle weakness (generalized): Secondary | ICD-10-CM | POA: Diagnosis not present

## 2022-03-31 DIAGNOSIS — M25512 Pain in left shoulder: Secondary | ICD-10-CM | POA: Diagnosis not present

## 2022-04-01 ENCOUNTER — Other Ambulatory Visit: Payer: Self-pay | Admitting: Sports Medicine

## 2022-04-04 DIAGNOSIS — M6289 Other specified disorders of muscle: Secondary | ICD-10-CM | POA: Diagnosis not present

## 2022-04-04 DIAGNOSIS — M6281 Muscle weakness (generalized): Secondary | ICD-10-CM | POA: Diagnosis not present

## 2022-04-04 DIAGNOSIS — R6889 Other general symptoms and signs: Secondary | ICD-10-CM | POA: Diagnosis not present

## 2022-04-04 DIAGNOSIS — R29898 Other symptoms and signs involving the musculoskeletal system: Secondary | ICD-10-CM | POA: Diagnosis not present

## 2022-04-04 DIAGNOSIS — Z723 Lack of physical exercise: Secondary | ICD-10-CM | POA: Diagnosis not present

## 2022-04-04 DIAGNOSIS — M25512 Pain in left shoulder: Secondary | ICD-10-CM | POA: Diagnosis not present

## 2022-04-06 DIAGNOSIS — R6889 Other general symptoms and signs: Secondary | ICD-10-CM | POA: Diagnosis not present

## 2022-04-06 DIAGNOSIS — R29898 Other symptoms and signs involving the musculoskeletal system: Secondary | ICD-10-CM | POA: Diagnosis not present

## 2022-04-06 DIAGNOSIS — M25512 Pain in left shoulder: Secondary | ICD-10-CM | POA: Diagnosis not present

## 2022-04-06 DIAGNOSIS — M6281 Muscle weakness (generalized): Secondary | ICD-10-CM | POA: Diagnosis not present

## 2022-04-06 DIAGNOSIS — Z723 Lack of physical exercise: Secondary | ICD-10-CM | POA: Diagnosis not present

## 2022-04-06 DIAGNOSIS — M6289 Other specified disorders of muscle: Secondary | ICD-10-CM | POA: Diagnosis not present

## 2022-04-10 DIAGNOSIS — R6889 Other general symptoms and signs: Secondary | ICD-10-CM | POA: Diagnosis not present

## 2022-04-10 DIAGNOSIS — M6289 Other specified disorders of muscle: Secondary | ICD-10-CM | POA: Diagnosis not present

## 2022-04-10 DIAGNOSIS — Z723 Lack of physical exercise: Secondary | ICD-10-CM | POA: Diagnosis not present

## 2022-04-10 DIAGNOSIS — M25512 Pain in left shoulder: Secondary | ICD-10-CM | POA: Diagnosis not present

## 2022-04-10 DIAGNOSIS — M6281 Muscle weakness (generalized): Secondary | ICD-10-CM | POA: Diagnosis not present

## 2022-04-10 DIAGNOSIS — R29898 Other symptoms and signs involving the musculoskeletal system: Secondary | ICD-10-CM | POA: Diagnosis not present

## 2022-04-13 DIAGNOSIS — M6281 Muscle weakness (generalized): Secondary | ICD-10-CM | POA: Diagnosis not present

## 2022-04-13 DIAGNOSIS — R29898 Other symptoms and signs involving the musculoskeletal system: Secondary | ICD-10-CM | POA: Diagnosis not present

## 2022-04-13 DIAGNOSIS — Z723 Lack of physical exercise: Secondary | ICD-10-CM | POA: Diagnosis not present

## 2022-04-13 DIAGNOSIS — M6289 Other specified disorders of muscle: Secondary | ICD-10-CM | POA: Diagnosis not present

## 2022-04-13 DIAGNOSIS — M25512 Pain in left shoulder: Secondary | ICD-10-CM | POA: Diagnosis not present

## 2022-04-13 DIAGNOSIS — R6889 Other general symptoms and signs: Secondary | ICD-10-CM | POA: Diagnosis not present

## 2022-04-17 DIAGNOSIS — M25612 Stiffness of left shoulder, not elsewhere classified: Secondary | ICD-10-CM | POA: Diagnosis not present

## 2022-04-17 DIAGNOSIS — M25512 Pain in left shoulder: Secondary | ICD-10-CM | POA: Diagnosis not present

## 2022-04-18 HISTORY — PX: OTHER SURGICAL HISTORY: SHX169

## 2022-04-26 DIAGNOSIS — M25612 Stiffness of left shoulder, not elsewhere classified: Secondary | ICD-10-CM | POA: Diagnosis not present

## 2022-04-26 DIAGNOSIS — M25512 Pain in left shoulder: Secondary | ICD-10-CM | POA: Diagnosis not present

## 2022-05-05 DIAGNOSIS — M25512 Pain in left shoulder: Secondary | ICD-10-CM | POA: Diagnosis not present

## 2022-05-05 DIAGNOSIS — M25612 Stiffness of left shoulder, not elsewhere classified: Secondary | ICD-10-CM | POA: Diagnosis not present

## 2022-05-09 DIAGNOSIS — M25512 Pain in left shoulder: Secondary | ICD-10-CM | POA: Diagnosis not present

## 2022-05-09 DIAGNOSIS — M25612 Stiffness of left shoulder, not elsewhere classified: Secondary | ICD-10-CM | POA: Diagnosis not present

## 2022-05-15 DIAGNOSIS — M25512 Pain in left shoulder: Secondary | ICD-10-CM | POA: Diagnosis not present

## 2022-05-15 DIAGNOSIS — M25612 Stiffness of left shoulder, not elsewhere classified: Secondary | ICD-10-CM | POA: Diagnosis not present

## 2022-05-19 DIAGNOSIS — M19012 Primary osteoarthritis, left shoulder: Secondary | ICD-10-CM | POA: Diagnosis not present

## 2022-05-22 ENCOUNTER — Ambulatory Visit (INDEPENDENT_AMBULATORY_CARE_PROVIDER_SITE_OTHER): Payer: Medicare HMO | Admitting: Sports Medicine

## 2022-05-22 ENCOUNTER — Encounter: Payer: Self-pay | Admitting: Sports Medicine

## 2022-05-22 ENCOUNTER — Ambulatory Visit (INDEPENDENT_AMBULATORY_CARE_PROVIDER_SITE_OTHER): Payer: Medicare HMO

## 2022-05-22 VITALS — BP 143/81 | HR 61 | Wt 168.0 lb

## 2022-05-22 DIAGNOSIS — G5601 Carpal tunnel syndrome, right upper limb: Secondary | ICD-10-CM

## 2022-05-22 DIAGNOSIS — L821 Other seborrheic keratosis: Secondary | ICD-10-CM

## 2022-05-22 DIAGNOSIS — H8113 Benign paroxysmal vertigo, bilateral: Secondary | ICD-10-CM

## 2022-05-22 DIAGNOSIS — H8110 Benign paroxysmal vertigo, unspecified ear: Secondary | ICD-10-CM | POA: Insufficient documentation

## 2022-05-22 MED ORDER — TRIAMCINOLONE ACETONIDE 40 MG/ML IJ SUSP
40.0000 mg | Freq: Once | INTRAMUSCULAR | Status: AC
  Administered 2022-05-22: 40 mg via INTRAMUSCULAR

## 2022-05-22 NOTE — Assessment & Plan Note (Addendum)
Right lower leg potential seborrheic keratosis, cryotherapy as above. Shave biopsy if recurs.

## 2022-05-22 NOTE — Assessment & Plan Note (Signed)
Increasing carpal tunnel symptoms on the right, at this point she has failed nighttime splinting, home conditioning, she has not had a hydrodissection. Today we performed a right median nerve hydrodissection, return to see me in 6 weeks for this.

## 2022-05-22 NOTE — Assessment & Plan Note (Signed)
Unremarkable physical exam. She gets vertiginous symptoms with head position changes. Adding the Epley maneuver, we did discuss the anatomy and pathophysiology. Return to see me 6 weeks as needed.

## 2022-05-22 NOTE — Progress Notes (Signed)
    Procedures performed today:    Procedure: Real-time Ultrasound Guided hydrodissection of the right median nerve at the carpal tunnel Device: Samsung HS60 Verbal informed consent obtained.  Time-out conducted.  Noted no overlying erythema, induration, or other signs of local infection.  Skin prepped in a sterile fashion.  Local anesthesia: Topical Ethyl chloride.  With sterile technique and under real time ultrasound guidance: Noted enlarged nerve, using a 25-gauge needle advanced into the carpal tunnel, taking care to avoid intraneural injection I injected medication both superficial to and deep to the median nerve freeing it from surrounding structures, I then redirected the needle deep and injected further medication around the flexor tendons deep within the carpal tunnel for a total of 1 cc kenalog 40, 5 cc 1% lidocaine without epinephrine. Completed without difficulty  Advised to call if fevers/chills, erythema, induration, drainage, or persistent bleeding.  Images permanently stored and available for review in PACS.  Impression: Technically successful ultrasound guided median nerve hydrodissection.  Procedure:  Cryodestruction of Right lower leg seborrheic keratosis Consent obtained and verified. Time-out conducted. Noted no overlying erythema, induration, or other signs of local infection. Completed without difficulty using Cryo-Gun. Advised to call if fevers/chills, erythema, induration, drainage, or persistent bleeding.  Independent interpretation of notes and tests performed by another provider:   None.  Brief History, Exam, Impression, and Recommendations:    Carpal tunnel syndrome on right Increasing carpal tunnel symptoms on the right, at this point she has failed nighttime splinting, home conditioning, she has not had a hydrodissection. Today we performed a right median nerve hydrodissection, return to see me in 6 weeks for this.   Seborrheic keratosis Right lower  leg potential seborrheic keratosis, cryotherapy as above. Shave biopsy if recurs.  Benign paroxysmal positional vertigo Unremarkable physical exam. She gets vertiginous symptoms with head position changes. Adding the Epley maneuver, we did discuss the anatomy and pathophysiology. Return to see me 6 weeks as needed.    ____________________________________________ Gwen Her. Dianah Field, M.D., ABFM., CAQSM., AME. Primary Care and Sports Medicine [facility]  Adjunct Professor of [employer]  Risk manager

## 2022-05-23 ENCOUNTER — Other Ambulatory Visit: Payer: Self-pay | Admitting: Sports Medicine

## 2022-05-23 DIAGNOSIS — M25612 Stiffness of left shoulder, not elsewhere classified: Secondary | ICD-10-CM | POA: Diagnosis not present

## 2022-05-23 DIAGNOSIS — M25512 Pain in left shoulder: Secondary | ICD-10-CM | POA: Diagnosis not present

## 2022-05-29 DIAGNOSIS — M25612 Stiffness of left shoulder, not elsewhere classified: Secondary | ICD-10-CM | POA: Diagnosis not present

## 2022-05-29 DIAGNOSIS — Z471 Aftercare following joint replacement surgery: Secondary | ICD-10-CM | POA: Diagnosis not present

## 2022-05-29 DIAGNOSIS — Z96612 Presence of left artificial shoulder joint: Secondary | ICD-10-CM | POA: Diagnosis not present

## 2022-05-29 DIAGNOSIS — M25512 Pain in left shoulder: Secondary | ICD-10-CM | POA: Diagnosis not present

## 2022-06-04 ENCOUNTER — Encounter: Payer: Self-pay | Admitting: Sports Medicine

## 2022-06-04 DIAGNOSIS — I10 Essential (primary) hypertension: Secondary | ICD-10-CM

## 2022-06-05 MED ORDER — PRAVASTATIN SODIUM 40 MG PO TABS: 40.0000 mg | ORAL_TABLET | Freq: Every day | ORAL | 0 refills | Status: DC

## 2022-06-06 DIAGNOSIS — M25512 Pain in left shoulder: Secondary | ICD-10-CM | POA: Diagnosis not present

## 2022-06-06 DIAGNOSIS — Z471 Aftercare following joint replacement surgery: Secondary | ICD-10-CM | POA: Diagnosis not present

## 2022-06-06 DIAGNOSIS — Z96612 Presence of left artificial shoulder joint: Secondary | ICD-10-CM | POA: Diagnosis not present

## 2022-06-07 ENCOUNTER — Ambulatory Visit (INDEPENDENT_AMBULATORY_CARE_PROVIDER_SITE_OTHER): Payer: Medicare HMO | Admitting: Obstetrics and Gynecology

## 2022-06-07 ENCOUNTER — Encounter: Payer: Self-pay | Admitting: Obstetrics and Gynecology

## 2022-06-07 VITALS — BP 154/79 | HR 57 | Ht 63.0 in | Wt 167.0 lb

## 2022-06-07 DIAGNOSIS — Z1231 Encounter for screening mammogram for malignant neoplasm of breast: Secondary | ICD-10-CM

## 2022-06-07 DIAGNOSIS — Z01419 Encounter for gynecological examination (general) (routine) without abnormal findings: Secondary | ICD-10-CM | POA: Diagnosis not present

## 2022-06-07 NOTE — Progress Notes (Signed)
Last mammogram- 11/11/21- negative

## 2022-06-07 NOTE — Progress Notes (Signed)
   ANNUAL EXAM Patient name: [name] MRN [id number]  Date of birth: 03-14-1951 Chief Complaint:   Annual Exam  History of Present Illness:   [name] is a 72 y.o. menopausal G2P2 s/p total hysterectomy, BSO being seen today for a routine annual exam.   Current complaints: none  Last pap 2015 NILM - no abnormals Last mammogram: BIRADS-1 11/10/21 Last colonoscopy: 05/2020     05/22/2022   10:08 AM 07/08/2021   10:37 AM 06/22/2021    8:08 AM 06/02/2020   11:18 AM  Depression screen PHQ 2/9  Decreased Interest 0 0 0 0  Down, Depressed, Hopeless 0 0 0 0  PHQ - 2 Score 0 0 0 0       No data to display         Review of Systems:   Pertinent items are noted in HPI Denies any headaches, blurred vision, fatigue, shortness of breath, chest pain, abdominal pain, abnormal vaginal discharge/itching/odor/irritation, problems with periods, bowel movements, urination, or intercourse unless otherwise stated above. Pertinent History Reviewed:  Reviewed past medical,surgical, social and family history.  Reviewed problem list, medications and allergies. Physical Assessment:   Vitals:   06/07/22 0850 06/07/22 0951  BP: (!) 144/81 (!) 154/79  Pulse: (!) 57   Weight: 167 lb (75.8 kg)   Height: 5\' 3"  (1.6 m)   Body mass index is 29.58 kg/m.        Physical Examination:   General appearance - well appearing, and in no distress  Mental status - alert, oriented to person, place, and time  Chest - respiratory effort normal  Heart - normal peripheral perfusion  Breasts - breasts appear normal, no suspicious masses, no skin or nipple changes or axillary nodes  Abdomen - soft, nontender, nondistended, no masses or organomegaly  Pelvic - VULVA: normal appearing vulva with no masses, tenderness or lesions  VAGINA: normal appearing vagina with normal color and discharge, no lesions, no palpable pelvic masses Chaperone present for exam  No results found for this or any previous visit  (from the past 24 hour(s)).  Assessment & Plan:  1) Well-Woman Exam Mammogram: due 10/2022 - ordered Colonoscopy: per GI - pt reports good follow up w/ her GI Pap: n/a  2) Menopausal hormone therapy - PT has been on estradiol 0.1mg  patch 2x/weekly for several years. Reports missing a patch and having significant hot flashes - Discussed less favorable data for benefit of HT after 72 years old. Reviewed that there is no specific age cut off for discontinuation of hormone therapy, but that we should consider tapering or stopping. She is considering her options.  3) Elevated blood pressure PCP follow up  Labs/procedures today:  Orders Placed This Encounter  Procedures   MM Digital Screening   Follow-up: Return in about 1 year (around 06/07/2023).  [name], MD 06/07/2022 12:47 PM

## 2022-06-08 MED ORDER — ESTRADIOL 0.075 MG/24HR TD PTTW: 1.0000 | MEDICATED_PATCH | TRANSDERMAL | 0 refills | Status: DC

## 2022-06-11 ENCOUNTER — Encounter: Payer: Self-pay | Admitting: Sports Medicine

## 2022-06-12 ENCOUNTER — Encounter: Payer: Self-pay | Admitting: Obstetrics and Gynecology

## 2022-06-12 DIAGNOSIS — M25512 Pain in left shoulder: Secondary | ICD-10-CM | POA: Diagnosis not present

## 2022-06-12 DIAGNOSIS — Z471 Aftercare following joint replacement surgery: Secondary | ICD-10-CM | POA: Diagnosis not present

## 2022-06-12 DIAGNOSIS — Z96612 Presence of left artificial shoulder joint: Secondary | ICD-10-CM | POA: Diagnosis not present

## 2022-06-12 DIAGNOSIS — M25612 Stiffness of left shoulder, not elsewhere classified: Secondary | ICD-10-CM | POA: Diagnosis not present

## 2022-06-19 DIAGNOSIS — M25612 Stiffness of left shoulder, not elsewhere classified: Secondary | ICD-10-CM | POA: Diagnosis not present

## 2022-06-19 DIAGNOSIS — R531 Weakness: Secondary | ICD-10-CM | POA: Diagnosis not present

## 2022-06-19 DIAGNOSIS — M25512 Pain in left shoulder: Secondary | ICD-10-CM | POA: Diagnosis not present

## 2022-06-23 ENCOUNTER — Other Ambulatory Visit: Payer: Self-pay | Admitting: Sports Medicine

## 2022-06-27 ENCOUNTER — Ambulatory Visit (INDEPENDENT_AMBULATORY_CARE_PROVIDER_SITE_OTHER): Payer: Medicare HMO | Admitting: Sports Medicine

## 2022-06-27 DIAGNOSIS — Z0000 Encounter for general adult medical examination without abnormal findings: Secondary | ICD-10-CM | POA: Diagnosis not present

## 2022-06-27 NOTE — Progress Notes (Signed)
MEDICARE ANNUAL WELLNESS VISIT  06/27/2022  Telephone Visit Disclaimer This Medicare AWV was conducted by telephone due to national recommendations for restrictions regarding the COVID-19 Pandemic (e.g. social distancing).  I verified, using two identifiers, that I am speaking with [name] or their authorized healthcare agent. I discussed the limitations, risks, security, and privacy concerns of performing an evaluation and management service by telephone and the potential availability of an in-person appointment in the future. The patient expressed understanding and agreed to proceed.  Location of Patient: Home Location of Provider (nurse):  In the office.  Subjective:    [name] is a 72 y.o. female patient of Thekkekandam, Ihor Austin, MD who had a Medicare Annual Wellness Visit today via telephone. Chyanna is Retired and lives with their spouse. she has 2 children. she reports that she is socially active and does interact with friends/family regularly. she is moderately physically active and enjoys playing golf, bowling, softball and reading.   Patient Care Team: Monica Becton, MD as PCP - General (Sports Medicine) Freddy Finner, MD (Inactive) as Consulting Physician (Obstetrics and Gynecology) Romero Belling, MD as Referring Physician (Physical Medicine and Rehabilitation)     06/27/2022    8:05 AM 02/23/2022    6:26 AM 02/16/2022   11:17 AM 01/03/2022    1:36 PM 06/22/2021    8:07 AM 02/18/2020    8:44 AM 02/09/2020   12:30 PM  Advanced Directives  Does Patient Have a Medical Advance Directive? Yes No No Yes Yes Yes No  Type of Advance Directive Living will   Healthcare Power of Attorney Living will    Does patient want to make changes to medical advance directive? No - Patient declined    No - Patient declined No - Patient declined   Would patient like information on creating a medical advance directive?  No - Patient declined No - Patient declined   No -  Patient declined No - Patient declined    Hospital Utilization Over the Past 12 Months: # of hospitalizations or ER visits: 0 # of surgeries: 2  Review of Systems    Patient reports that her overall health is unchanged compared to last year.  History obtained from chart review and the patient  Patient Reported Readings (BP, Pulse, CBG, Weight, etc) none  Pain Assessment Pain : No/denies pain     Current Medications & Allergies (verified) Allergies as of 06/27/2022       Reactions   Hydrochlorothiazide W-triamterene Other (See Comments)   hypokalemia   Tramadol Itching   Povidone-iodine Itching   Intolerance, if exposed over a long period.  Ok if used and washed off quickly.        Medication List        Accurate as of June 27, 2022  8:15 AM. If you have any questions, ask your nurse or doctor.          calcium carbonate 600 MG tablet Commonly known as: OS-CAL Take 600 mg by mouth 2 (two) times daily with a meal.   estradiol 0.075 MG/24HR Commonly known as: VIVELLE-DOT Place 1 patch onto the skin 2 (two) times a week.   famotidine 10 MG chewable tablet Commonly known as: PEPCID AC Chew 10 mg by mouth as needed.   meloxicam 15 MG tablet Commonly known as: MOBIC TAKE 1 TABLET DAILY   metoprolol succinate 25 MG 24 hr tablet Commonly known as: TOPROL-XL TAKE 1 TABLET DAILY   omeprazole  20 MG capsule Commonly known as: PRILOSEC Take 1 capsule (20 mg total) by mouth daily. To gastric protection while taking NSAIDs   pravastatin 40 MG tablet Commonly known as: PRAVACHOL Take 1 tablet (40 mg total) by mouth daily.   PRO NUTRIENTS FRUIT & VEGGIE PO Take by mouth.   spironolactone 25 MG tablet Commonly known as: ALDACTONE TAKE 1 TABLET TWICE A DAY   tiZANidine 4 MG tablet Commonly known as: ZANAFLEX Take 1 tablet (4 mg total) by mouth every 6 (six) hours as needed for muscle spasms.   triamcinolone 0.1 % cream : eucerin Crea Apply 1 application  topically 2 (two) times daily.   Vitamin D3 125 MCG (5000 UT) capsule Generic drug: Cholecalciferol Take 5,000 Units by mouth daily.   zolpidem 5 MG tablet Commonly known as: AMBIEN TAKE ONE TABLET BY MOUTH EVERY 3RD NIGHT PRN AT BETIME What changed:  how much to take how to take this when to take this reasons to take this additional instructions        History (reviewed): Past Medical History:  Diagnosis Date   Abnormal vaginal Pap smear    monitored annually by Dr. Konrad Dolores   Colon polyps    multple right colon polyps   Degenerative joint disease    GERD (gastroesophageal reflux disease)    Hyperlipidemia    NMR: LDL 120 (1267/584), HDL 59, TG 60. LDL goall<130, ideally <100   Hypertension    Hypokalemia    on HCTZ/Triam   PONV (postoperative nausea and vomiting)    Vitamin D deficiency    2000 IU   Past Surgical History:  Procedure Laterality Date   ABDOMINAL HYSTERECTOMY     APPENDECTOMY     BREAST BIOPSY     BUNIONECTOMY  03/2009   CESAREAN SECTION     CHOLECYSTECTOMY  09/2007   Lap, Dr.Gerkin    COLONOSCOPY  2008   virtual colonoscopy   DILATION AND CURETTAGE OF UTERUS     ENDOMETRIAL BIOPSY  04/28/2013   Dr Jennette Kettle   ENDOSCOPIC CONCHA BULLOSA RESECTION Left 03/24/2013   Procedure: ENDOSCOPIC CONCHA BULLOSA RESECTION;  Surgeon: Serena Colonel, MD;  Location: [facility];  Service: ENT;  Laterality: Left;   ETHMOIDECTOMY Left 03/24/2013   Procedure: ETHMOIDECTOMY;  Surgeon: Serena Colonel, MD;  Location: [facility];  Service: ENT;  Laterality: Left;   HAMMER TOE SURGERY     HYSTEROSCOPY     w/ D and C in 09/2005,02/2007,03/2008 (+ poly removal); IDU insertion 09/2008, Dr.Neal   Liposuction and tummy tuck  04/18/2022   Dr. Brennan Bailey   NEURECTOMY FOOT  03/2008   left   NEUROMA SURGERY     left foot ,Dr.Sikora   PARTIAL COLECTOMY Right 03/30/2016   Procedure: RIGHT COLECTOMY;  Surgeon: Darnell Level, MD;  Location: [facility] OR;  Service:  General;  Laterality: Right;   TOE SURGERY     for bone spur   TOTAL SHOULDER ARTHROPLASTY Right 02/18/2020   Procedure: TOTAL SHOULDER ARTHROPLASTY;  Surgeon: Bjorn Pippin, MD;  Location: [facility];  Service: Orthopedics;  Laterality: Right;   TOTAL SHOULDER ARTHROPLASTY Left 02/23/2022   Procedure: TOTAL SHOULDER ARTHROPLASTY;  Surgeon: Bjorn Pippin, MD;  Location: [facility];  Service: Orthopedics;  Laterality: Left;   TUBAL LIGATION     Family History  Problem Relation Age of Onset   Heart attack Father 64       CABG 4-vessel  Diabetes Father        late onset   Lung cancer Mother        former smoker   Diabetes Mother        late onset   Breast cancer Mother    Obesity Sister    Stroke Paternal Grandfather 69   Lung cancer Maternal Grandfather        former smoker   Breast cancer Maternal Aunt    Heart attack Paternal Grandmother 30   Diabetes type I Other        grand daughter   Social History   Socioeconomic History   Marital status: Married    Spouse name: Reuel Boom   Number of children: 2   Years of education: 14   Highest education level: Associate degree: academic program  Occupational History   Occupation: Engineer, civil (consulting), [employer]   Tobacco Use   Smoking status: Never   Smokeless tobacco: Never  Vaping Use   Vaping Use: Never used  Substance and Sexual Activity   Alcohol use: Yes    Comment: occasional   Drug use: No   Sexual activity: Yes    Birth control/protection: Surgical    Comment: hyst  Other Topics Concern   Not on file  Social History Narrative   Lives with her husband. She enjoys playing golf, bowling, softball and reading.    Social Determinants of Health   Financial Resource Strain: Low Risk  (06/27/2022)   Overall Financial Resource Strain (CARDIA)    Difficulty of Paying Living Expenses: Not hard at all  Food Insecurity: No Food Insecurity (06/27/2022)   Hunger Vital Sign    Worried About Running Out of  Food in the Last Year: Never true    Ran Out of Food in the Last Year: Never true  Transportation Needs: No Transportation Needs (06/27/2022)   PRAPARE - Administrator, Civil Service (Medical): No    Lack of Transportation (Non-Medical): No  Physical Activity: Sufficiently Active (06/27/2022)   Exercise Vital Sign    Days of Exercise per Week: 4 days    Minutes of Exercise per Session: 120 min  Stress: No Stress Concern Present (06/27/2022)   [employer] of Occupational Health - Occupational Stress Questionnaire    Feeling of Stress : Not at all  Social Connections: Moderately Integrated (06/27/2022)   Social Connection and Isolation Panel [NHANES]    Frequency of Communication with Friends and Family: More than three times a week    Frequency of Social Gatherings with Friends and Family: Twice a week    Attends Religious Services: More than 4 times per year    Active Member of [employer] or Organizations: No    Attends Banker Meetings: Never    Marital Status: Married    Activities of Daily Living    06/27/2022    8:10 AM 02/23/2022    6:29 AM  In your present state of health, do you have any difficulty performing the following activities:  Hearing? 0 0  Vision? 0 0  Difficulty concentrating or making decisions? 0 0  Walking or climbing stairs? 0 0  Dressing or bathing? 0 0  Doing errands, shopping? 0   Preparing Food and eating ? N   Using the Toilet? N   In the past six months, have you accidently leaked urine? Y   Comment stress incontinence   Do you have problems with loss of bowel control? N   Managing your Medications? N  Managing your Finances? N   Housekeeping or managing your Housekeeping? N     Patient Education/ Literacy How often do you need to have someone help you when you read instructions, pamphlets, or other written materials from your doctor or pharmacy?: 1 - Never What is the last grade level you completed in school?: Diploma  RN  Exercise Current Exercise Habits: Home exercise routine, Type of exercise: Other - see comments (softball), Time (Minutes): > 60, Frequency (Times/Week): 4, Weekly Exercise (Minutes/Week): 0, Intensity: Moderate, Exercise limited by: orthopedic condition(s)  Diet Patient reports consuming 3 meals a day and 1-2 snack(s) a day Patient reports that her primary diet is: Regular Patient reports that she does have regular access to food.   Depression Screen    06/27/2022    8:06 AM 05/22/2022   10:08 AM 07/08/2021   10:37 AM 06/22/2021    8:08 AM 06/02/2020   11:18 AM  PHQ 2/9 Scores  PHQ - 2 Score 0 0 0 0 0     Fall Risk    06/27/2022    8:05 AM 05/22/2022   10:08 AM 07/08/2021   10:37 AM 06/22/2021    8:07 AM 06/02/2020   11:18 AM  Fall Risk   Falls in the past year? 1 0 1 1 1   Number falls in past yr: 0 0 0 0 0  Injury with Fall? 0 0 0 0 0  Risk for fall due to : History of fall(s)  No Fall Risks No Fall Risks   Follow up Falls evaluation completed;Education provided;Falls prevention discussed Falls evaluation completed Falls evaluation completed Falls evaluation completed      Objective:  KIMBRIA CAMPOSANO seemed alert and oriented and she participated appropriately during our telephone visit.  Blood Pressure Weight BMI  BP Readings from Last 3 Encounters:  06/07/22 (!) 154/79  05/22/22 (!) 143/81  02/23/22 (!) 154/76   Wt Readings from Last 3 Encounters:  06/07/22 167 lb (75.8 kg)  05/22/22 168 lb (76.2 kg)  02/23/22 168 lb 11.2 oz (76.5 kg)   BMI Readings from Last 1 Encounters:  06/07/22 29.58 kg/m    *Unable to obtain current vital signs, weight, and BMI due to telephone visit type  Hearing/Vision  Shalayne did not seem to have difficulty with hearing/understanding during the telephone conversation Reports that she has had a formal eye exam by an eye care professional within the past year Reports that she has not had a formal hearing evaluation within the past  year *Unable to fully assess hearing and vision during telephone visit type  Cognitive Function:    06/27/2022    8:12 AM 06/22/2021    8:16 AM  6CIT Screen  What Year? 0 points 0 points  What month? 0 points 0 points  What time? 0 points 0 points  Count back from 20 0 points 0 points  Months in reverse 0 points 0 points  Repeat phrase 2 points 2 points  Total Score 2 points 2 points   (Normal:0-7, Significant for Dysfunction: >8)  Normal Cognitive Function Screening: Yes   Immunization & Health Maintenance Record Immunization History  Administered Date(s) Administered   COVID-19, mRNA, vaccine(Comirnaty)12 years and older 01/31/2022   Fluad Quad(high Dose 65+) 12/31/2018   H1N1 04/22/2008   Influenza Whole 12/19/2011   Influenza, High Dose Seasonal PF 01/04/2018   Influenza-Unspecified 12/19/2014, 12/19/2015, 12/18/2016, 01/26/2021   Moderna Covid-19 Vaccine Bivalent Booster 43yrs & up 01/24/2021   Moderna Sars-Covid-2 Vaccination 01/27/2020  PFIZER(Purple Top)SARS-COV-2 Vaccination 05/22/2019, 06/18/2019   PNEUMOCOCCAL CONJUGATE-20 06/01/2021   Pneumococcal Polysaccharide-23 01/04/2018, 04/08/2019   Td 01/25/2005   Tdap 03/22/2019   Zoster Recombinat (Shingrix) 06/01/2021, 08/23/2021   Zoster, Live 05/09/2010    Health Maintenance  Topic Date Due   COVID-19 Vaccine (6 - 2023-24 season) 07/13/2022 (Originally 03/28/2022)   INFLUENZA VACCINE  10/19/2022   COLONOSCOPY (Pts [phone] Insurance coverage will need to be confirmed)  06/01/2023   Medicare Annual Wellness (AWV)  06/27/2023   MAMMOGRAM  11/11/2023   DTaP/Tdap/Td (3 - Td or Tdap) 03/21/2029   Pneumonia Vaccine 6565+ Years old  Completed   DEXA SCAN  Completed   Hepatitis C Screening  Completed   Zoster Vaccines- Shingrix  Completed   HPV VACCINES  Aged Out       Assessment  This is a routine wellness examination for Amy SayersDebra M Harrower.  Health Maintenance: Due or Overdue There are no preventive care  reminders to display for this patient.   Amy Sayersebra M Bardon does not need a referral for Community Assistance: Care Management:   no Social Work:    no Prescription Assistance:  no Nutrition/Diabetes Education:  no   Plan:  Personalized Goals  Goals Addressed               This Visit's Progress     Patient Stated (pt-stated)        Patient stated that she would like to work on losing weight.       Personalized Health Maintenance & Screening Recommendations  There are no preventive care reminders to display for this patient.  Lung Cancer Screening Recommended: no (Low Dose CT Chest recommended if Age 59-80 years, 30 pack-year currently smoking OR have quit w/in past 15 years) Hepatitis C Screening recommended: no HIV Screening recommended: no  Advanced Directives: Written information was not prepared per patient's request.  Referrals & Orders No orders of the defined types were placed in this encounter.   Follow-up Plan Follow-up with Monica Bectonhekkekandam, Thomas J, MD as planned Medicare wellness visit in one year.  Patient will access AVS on my chart.   I have personally reviewed and noted the following in the patient's chart:   Medical and social history Use of alcohol, tobacco or illicit drugs  Current medications and supplements Functional ability and status Nutritional status Physical activity Advanced directives List of other physicians Hospitalizations, surgeries, and ER visits in previous 12 months Vitals Screenings to include cognitive, depression, and falls Referrals and appointments  In addition, I have reviewed and discussed with Amy Sayersebra M Barreiro certain preventive protocols, quality metrics, and best practice recommendations. A written personalized care plan for preventive services as well as general preventive health recommendations is available and can be mailed to the patient at her request.      [place]esto CharonBableen  Leonda Cristo, RN BSN  06/27/2022

## 2022-06-27 NOTE — Patient Instructions (Signed)
MEDICARE ANNUAL WELLNESS VISIT Health Maintenance Summary and Written Plan of Care  [name] ,  Thank you for allowing me to perform your Medicare Annual Wellness Visit and for your ongoing commitment to your health.   Health Maintenance & Immunization History Health Maintenance  Topic Date Due   COVID-19 Vaccine (6 - 2023-24 season) 07/13/2022 (Originally 03/28/2022)   INFLUENZA VACCINE  10/19/2022   COLONOSCOPY (Pts 45-65yrs Insurance coverage will need to be confirmed)  06/01/2023   Medicare Annual Wellness (AWV)  06/27/2023   MAMMOGRAM  11/11/2023   DTaP/Tdap/Td (3 - Td or Tdap) 03/21/2029   Pneumonia Vaccine 75+ Years old  Completed   DEXA SCAN  Completed   Hepatitis C Screening  Completed   Zoster Vaccines- Shingrix  Completed   HPV VACCINES  Aged Out   Immunization History  Administered Date(s) Administered   COVID-19, mRNA, vaccine(Comirnaty)12 years and older 01/31/2022   Fluad Quad(high Dose 65+) 12/31/2018   H1N1 04/22/2008   Influenza Whole 12/19/2011   Influenza, High Dose Seasonal PF 01/04/2018   Influenza-Unspecified 12/19/2014, 12/19/2015, 12/18/2016, 01/26/2021   Moderna Covid-19 Vaccine Bivalent Booster 1yrs & up 01/24/2021   Moderna Sars-Covid-2 Vaccination 01/27/2020   PFIZER(Purple Top)SARS-COV-2 Vaccination 05/22/2019, 06/18/2019   PNEUMOCOCCAL CONJUGATE-20 06/01/2021   Pneumococcal Polysaccharide-23 01/04/2018, 04/08/2019   Td 01/25/2005   Tdap 03/22/2019   Zoster Recombinat (Shingrix) 06/01/2021, 08/23/2021   Zoster, Live 05/09/2010    These are the patient goals that we discussed:  Goals Addressed               This Visit's Progress     Patient Stated (pt-stated)        Patient stated that she would like to work on losing weight.         This is a list of Health Maintenance Items that are overdue or due now: There are no preventive care reminders to display for this patient.   Orders/Referrals Placed Today: No orders of the  defined types were placed in this encounter.  (Contact our referral department at [phone] if you have not spoken with someone about your referral appointment within the next 5 days)    Follow-up Plan Follow-up with Monica Becton, MD as planned Medicare wellness visit in one year.  Patient will access AVS on my chart.     Health Maintenance, Female Adopting a healthy lifestyle and getting preventive care are important in promoting health and wellness. Ask your health care provider about: The right schedule for you to have regular tests and exams. Things you can do on your own to prevent diseases and keep yourself healthy. What should I know about diet, weight, and exercise? Eat a healthy diet  Eat a diet that includes plenty of vegetables, fruits, low-fat dairy products, and lean protein. Do not eat a lot of foods that are high in solid fats, added sugars, or sodium. Maintain a healthy weight Body mass index (BMI) is used to identify weight problems. It estimates body fat based on height and weight. Your health care provider can help determine your BMI and help you achieve or maintain a healthy weight. Get regular exercise Get regular exercise. This is one of the most important things you can do for your health. Most adults should: Exercise for at least 150 minutes each week. The exercise should increase your heart rate and make you sweat (moderate-intensity exercise). Do strengthening exercises at least twice a week. This is in addition to the moderate-intensity exercise. Spend less  time sitting. Even light physical activity can be beneficial. Watch cholesterol and blood lipids Have your blood tested for lipids and cholesterol at 70 years of age, then have this test every 5 years. Have your cholesterol levels checked more often if: Your lipid or cholesterol levels are high. You are older than 70 years of age. You are at high risk for heart disease. What should I know  about cancer screening? Depending on your health history and family history, you may need to have cancer screening at various ages. This may include screening for: Breast cancer. Cervical cancer. Colorectal cancer. Skin cancer. Lung cancer. What should I know about heart disease, diabetes, and high blood pressure? Blood pressure and heart disease High blood pressure causes heart disease and increases the risk of stroke. This is more likely to develop in people who have high blood pressure readings or are overweight. Have your blood pressure checked: Every 3-5 years if you are 18-55 years of age. Every year if you are 61 years old or older. Diabetes Have regular diabetes screenings. This checks your fasting blood sugar level. Have the screening done: Once every three years after age 72 if you are at a normal weight and have a low risk for diabetes. More often and at a younger age if you are overweight or have a high risk for diabetes. What should I know about preventing infection? Hepatitis B If you have a higher risk for hepatitis B, you should be screened for this virus. Talk with your health care provider to find out if you are at risk for hepatitis B infection. Hepatitis C Testing is recommended for: Everyone born from [birth year] through 1963. Anyone with known risk factors for hepatitis C. Sexually transmitted infections (STIs) Get screened for STIs, including gonorrhea and chlamydia, if: You are sexually active and are younger than 70 years of age. You are older than 70 years of age and your health care provider tells you that you are at risk for this type of infection. Your sexual activity has changed since you were last screened, and you are at increased risk for chlamydia or gonorrhea. Ask your health care provider if you are at risk. Ask your health care provider about whether you are at high risk for HIV. Your health care provider may recommend a prescription medicine to help prevent  HIV infection. If you choose to take medicine to prevent HIV, you should first get tested for HIV. You should then be tested every 3 months for as long as you are taking the medicine. Pregnancy If you are about to stop having your period (premenopausal) and you may become pregnant, seek counseling before you get pregnant. Take 400 to 800 micrograms (mcg) of folic acid every day if you become pregnant. Ask for birth control (contraception) if you want to prevent pregnancy. Osteoporosis and menopause Osteoporosis is a disease in which the bones lose minerals and strength with aging. This can result in bone fractures. If you are 10 years old or older, or if you are at risk for osteoporosis and fractures, ask your health care provider if you should: Be screened for bone loss. Take a calcium or vitamin D supplement to lower your risk of fractures. Be given hormone replacement therapy (HRT) to treat symptoms of menopause. Follow these instructions at home: Alcohol use Do not drink alcohol if: Your health care provider tells you not to drink. You are pregnant, may be pregnant, or are planning to become pregnant. If you drink alcohol:  Limit how much you have to: 0-1 drink a day. Know how much alcohol is in your drink. In the [place] one drink equals one 12 oz bottle of beer (355 mL), one 5 oz glass of wine (148 mL), or one 1 oz glass of hard liquor (44 mL). Lifestyle Do not use any products that contain nicotine or tobacco. These products include cigarettes, chewing tobacco, and vaping devices, such as e-cigarettes. If you need help quitting, ask your health care provider. Do not use street drugs. Do not share needles. Ask your health care provider for help if you need support or information about quitting drugs. General instructions Schedule regular health, dental, and eye exams. Stay current with your vaccines. Tell your health care provider if: You often feel depressed. You have ever been  abused or do not feel safe at home. Summary Adopting a healthy lifestyle and getting preventive care are important in promoting health and wellness. Follow your health care provider's instructions about healthy diet, exercising, and getting tested or screened for diseases. Follow your health care provider's instructions on monitoring your cholesterol and blood pressure. This information is not intended to replace advice given to you by your health care provider. Make sure you discuss any questions you have with your health care provider. Document Revised: 07/26/2020 Document Reviewed: 07/26/2020 Elsevier Patient Education  2023 [employer]

## 2022-07-11 ENCOUNTER — Ambulatory Visit (INDEPENDENT_AMBULATORY_CARE_PROVIDER_SITE_OTHER): Payer: Medicare HMO | Admitting: Sports Medicine

## 2022-07-11 ENCOUNTER — Encounter: Payer: Self-pay | Admitting: Sports Medicine

## 2022-07-11 VITALS — BP 126/81 | HR 67 | Ht 63.0 in | Wt 162.0 lb

## 2022-07-11 DIAGNOSIS — L821 Other seborrheic keratosis: Secondary | ICD-10-CM

## 2022-07-11 DIAGNOSIS — Z0000 Encounter for general adult medical examination without abnormal findings: Secondary | ICD-10-CM | POA: Diagnosis not present

## 2022-07-11 DIAGNOSIS — I10 Essential (primary) hypertension: Secondary | ICD-10-CM | POA: Diagnosis not present

## 2022-07-11 DIAGNOSIS — R739 Hyperglycemia, unspecified: Secondary | ICD-10-CM

## 2022-07-11 DIAGNOSIS — E782 Mixed hyperlipidemia: Secondary | ICD-10-CM

## 2022-07-11 MED ORDER — TIZANIDINE HCL 4 MG PO TABS: 4.0000 mg | ORAL_TABLET | Freq: Four times a day (QID) | ORAL | 3 refills | Status: DC | PRN

## 2022-07-11 MED ORDER — ZOLPIDEM TARTRATE 5 MG PO TABS: 5.0000 mg | ORAL_TABLET | Freq: Every evening | ORAL | 1 refills | Status: DC | PRN

## 2022-07-11 MED ORDER — PRAVASTATIN SODIUM 40 MG PO TABS: 40.0000 mg | ORAL_TABLET | Freq: Every day | ORAL | 3 refills | Status: DC

## 2022-07-11 NOTE — Assessment & Plan Note (Signed)
Much better after cryotherapy, there is a rim of residual SK, repeat cryotherapy today.

## 2022-07-11 NOTE — Assessment & Plan Note (Addendum)
Annual physical as above, updated on screenings and vaccines. Checking routine labs. Return to see me in a year. [name] does have a goal weight of 150 pounds.

## 2022-07-11 NOTE — Progress Notes (Signed)
Subjective:    CC: Annual Physical Exam  HPI:  This patient is here for their annual physical  I reviewed the past medical history, family history, social history, surgical history, and allergies today and no changes were needed.  Please see the problem list section below in epic for further details.  Past Medical History: Past Medical History:  Diagnosis Date   Abnormal vaginal Pap smear    monitored annually by Dr. Konrad Dolores   Colon polyps    multple right colon polyps   Degenerative joint disease    GERD (gastroesophageal reflux disease)    Hyperlipidemia    NMR: LDL 120 (1267/584), HDL 59, TG 60. LDL goall<130, ideally <100   Hypertension    Hypokalemia    on HCTZ/Triam   PONV (postoperative nausea and vomiting)    Vitamin D deficiency    2000 IU   Past Surgical History: Past Surgical History:  Procedure Laterality Date   ABDOMINAL HYSTERECTOMY     APPENDECTOMY     BREAST BIOPSY     BUNIONECTOMY  03/2009   CESAREAN SECTION     CHOLECYSTECTOMY  09/2007   Lap, Dr.Gerkin    COLONOSCOPY  2008   virtual colonoscopy   DILATION AND CURETTAGE OF UTERUS     ENDOMETRIAL BIOPSY  04/28/2013   Dr Jennette Kettle   ENDOSCOPIC CONCHA BULLOSA RESECTION Left 03/24/2013   Procedure: ENDOSCOPIC CONCHA BULLOSA RESECTION;  Surgeon: Serena Colonel, MD;  Location: [facility];  Service: ENT;  Laterality: Left;   ETHMOIDECTOMY Left 03/24/2013   Procedure: ETHMOIDECTOMY;  Surgeon: Serena Colonel, MD;  Location: [facility];  Service: ENT;  Laterality: Left;   HAMMER TOE SURGERY     HYSTEROSCOPY     w/ D and C in 09/2005,02/2007,03/2008 (+ poly removal); IDU insertion 09/2008, Dr.Neal   Liposuction and tummy tuck  04/18/2022   Dr. Brennan Bailey   NEURECTOMY FOOT  03/2008   left   NEUROMA SURGERY     left foot ,Dr.Sikora   PARTIAL COLECTOMY Right 03/30/2016   Procedure: RIGHT COLECTOMY;  Surgeon: Darnell Level, MD;  Location: [facility];  Service: General;  Laterality: Right;   TOE  SURGERY     for bone spur   TOTAL SHOULDER ARTHROPLASTY Right 02/18/2020   Procedure: TOTAL SHOULDER ARTHROPLASTY;  Surgeon: Bjorn Pippin, MD;  Location: [facility];  Service: Orthopedics;  Laterality: Right;   TOTAL SHOULDER ARTHROPLASTY Left 02/23/2022   Procedure: TOTAL SHOULDER ARTHROPLASTY;  Surgeon: Bjorn Pippin, MD;  Location: [facility];  Service: Orthopedics;  Laterality: Left;   TUBAL LIGATION     Social History: Social History   Socioeconomic History   Marital status: Married    Spouse name: Reuel Boom   Number of children: 2   Years of education: 14   Highest education level: Associate degree: academic program  Occupational History   Occupation: Engineer, civil (consulting), [employer]   Tobacco Use   Smoking status: Never   Smokeless tobacco: Never  Vaping Use   Vaping Use: Never used  Substance and Sexual Activity   Alcohol use: Yes    Comment: occasional   Drug use: No   Sexual activity: Yes    Birth control/protection: Surgical    Comment: hyst  Other Topics Concern   Not on file  Social History Narrative   Lives with her husband. She enjoys playing golf, bowling, softball and reading.    Social Determinants of Corporate investment banker  Strain: Low Risk  (06/27/2022)   Overall Financial Resource Strain (CARDIA)    Difficulty of Paying Living Expenses: Not hard at all  Food Insecurity: No Food Insecurity (06/27/2022)   Hunger Vital Sign    Worried About Running Out of Food in the Last Year: Never true    Ran Out of Food in the Last Year: Never true  Transportation Needs: No Transportation Needs (06/27/2022)   PRAPARE - Administrator, Civil Service (Medical): No    Lack of Transportation (Non-Medical): No  Physical Activity: Sufficiently Active (06/27/2022)   Exercise Vital Sign    Days of Exercise per Week: 4 days    Minutes of Exercise per Session: 120 min  Stress: No Stress Concern Present (06/27/2022)   [employer] of  Occupational Health - Occupational Stress Questionnaire    Feeling of Stress : Not at all  Social Connections: Moderately Integrated (06/27/2022)   Social Connection and Isolation Panel [NHANES]    Frequency of Communication with Friends and Family: More than three times a week    Frequency of Social Gatherings with Friends and Family: Twice a week    Attends Religious Services: More than 4 times per year    Active Member of [employer] or Organizations: No    Attends Engineer, structural: Never    Marital Status: Married   Family History: Family History  Problem Relation Age of Onset   Heart attack Father 26       CABG 4-vessel   Diabetes Father        late onset   Lung cancer Mother        former smoker   Diabetes Mother        late onset   Breast cancer Mother    Obesity Sister    Stroke Paternal Grandfather 68   Lung cancer Maternal Grandfather        former smoker   Breast cancer Maternal Aunt    Heart attack Paternal Grandmother 83   Diabetes type I Other        grand daughter   Allergies: Allergies  Allergen Reactions   Hydrochlorothiazide W-Triamterene Other (See Comments)    hypokalemia   Tramadol Itching   Povidone-Iodine Itching    Intolerance, if exposed over a long period.  Ok if used and washed off quickly.   Medications: See med rec.  Review of Systems: No headache, visual changes, nausea, vomiting, diarrhea, constipation, dizziness, abdominal pain, skin rash, fevers, chills, night sweats, swollen lymph nodes, weight loss, chest pain, body aches, joint swelling, muscle aches, shortness of breath, mood changes, visual or auditory hallucinations.  Objective:    General: Well Developed, well nourished, and in no acute distress.  Neuro: Alert and oriented x3, extra-ocular muscles intact, sensation grossly intact. Cranial nerves II through XII are intact, motor, sensory, and coordinative functions are all intact. HEENT: Normocephalic, atraumatic, pupils  equal round reactive to light, neck supple, no masses, no lymphadenopathy, thyroid nonpalpable. Oropharynx, nasopharynx, external ear canals are unremarkable. Skin: Warm and dry, no rashes noted.  Cardiac: Regular rate and rhythm, no murmurs rubs or gallops.  Respiratory: Clear to auscultation bilaterally. Not using accessory muscles, speaking in full sentences.  Abdominal: Soft, nontender, nondistended, positive bowel sounds, no masses, no organomegaly.  Musculoskeletal: Shoulder, elbow, wrist, hip, knee, ankle stable, and with full range of motion.  Procedure:  Cryodestruction of right lower leg seborrheic keratosis Consent obtained and verified. Time-out conducted. Noted no overlying erythema, induration,  or other signs of local infection. Completed without difficulty using Cryo-Gun. Advised to call if fevers/chills, erythema, induration, drainage, or persistent bleeding.  Impression and Recommendations:    The patient was counselled, risk factors were discussed, anticipatory guidance given.  Annual physical exam Annual physical as above, updated on screenings and vaccines. Checking routine labs. Return to see me in a year. Shaquisha does have a goal weight of 150 pounds.  Seborrheic keratosis Much better after cryotherapy, there is a rim of residual SK, repeat cryotherapy today.   ____________________________________________ Ihor Austin. Benjamin Stain, M.D., ABFM., CAQSM., AME. Primary Care and Sports Medicine [facility]  Adjunct Professor of Family Medicine  [place] [employer] [facility] [employer]  Restaurant manager, fast food

## 2022-07-17 ENCOUNTER — Other Ambulatory Visit: Payer: Self-pay

## 2022-07-17 DIAGNOSIS — E782 Mixed hyperlipidemia: Secondary | ICD-10-CM | POA: Diagnosis not present

## 2022-07-17 DIAGNOSIS — R739 Hyperglycemia, unspecified: Secondary | ICD-10-CM | POA: Diagnosis not present

## 2022-07-18 LAB — COMPREHENSIVE METABOLIC PANEL
ALT: 36 IU/L — ABNORMAL HIGH (ref 0–32)
AST: 33 IU/L (ref 0–40)
Albumin/Globulin Ratio: 2.2 (ref 1.2–2.2)
Albumin: 4.4 g/dL (ref 3.9–4.9)
Alkaline Phosphatase: 88 IU/L (ref 44–121)
BUN/Creatinine Ratio: 11 — ABNORMAL LOW (ref 12–28)
BUN: 11 mg/dL (ref 8–27)
Bilirubin Total: 0.5 mg/dL (ref 0.0–1.2)
CO2: 23 mmol/L (ref 20–29)
Calcium: 9.9 mg/dL (ref 8.7–10.3)
Chloride: 99 mmol/L (ref 96–106)
Creatinine, Ser: 0.97 mg/dL (ref 0.57–1.00)
Globulin, Total: 2 g/dL (ref 1.5–4.5)
Glucose: 90 mg/dL (ref 70–99)
Potassium: 4.9 mmol/L (ref 3.5–5.2)
Sodium: 134 mmol/L (ref 134–144)
Total Protein: 6.4 g/dL (ref 6.0–8.5)
eGFR: 63 mL/min/{1.73_m2} (ref >59)

## 2022-07-18 LAB — CBC
Hematocrit: 45.1 % (ref 34.0–46.6)
Hemoglobin: 15.3 g/dL (ref 11.1–15.9)
MCH: 33 pg (ref 26.6–33.0)
MCHC: 33.9 g/dL (ref 31.5–35.7)
MCV: 97 fL (ref 79–97)
Platelets: 244 10*3/uL (ref 150–450)
RBC: 4.64 x10E6/uL (ref 3.77–5.28)
RDW: 12.1 % (ref 11.7–15.4)
WBC: 5.5 10*3/uL (ref 3.4–10.8)

## 2022-07-18 LAB — LIPID PANEL
Chol/HDL Ratio: 3.1 ratio (ref 0.0–4.4)
Cholesterol, Total: 171 mg/dL (ref 100–199)
HDL: 56 mg/dL (ref >39)
LDL Chol Calc (NIH): 100 mg/dL — ABNORMAL HIGH (ref 0–99)
Triglycerides: 81 mg/dL (ref 0–149)
VLDL Cholesterol Cal: 15 mg/dL (ref 5–40)

## 2022-07-18 LAB — HEMOGLOBIN A1C
Est. average glucose Bld gHb Est-mCnc: 105 mg/dL
Hgb A1c MFr Bld: 5.3 % (ref 4.8–5.6)

## 2022-07-18 LAB — TSH: TSH: 2.19 u[IU]/mL (ref 0.450–4.500)

## 2022-08-12 ENCOUNTER — Other Ambulatory Visit: Payer: Self-pay | Admitting: Sports Medicine

## 2022-08-22 ENCOUNTER — Other Ambulatory Visit: Payer: Self-pay | Admitting: Obstetrics and Gynecology

## 2022-08-23 ENCOUNTER — Other Ambulatory Visit: Payer: Self-pay

## 2022-08-23 DIAGNOSIS — N951 Menopausal and female climacteric states: Secondary | ICD-10-CM

## 2022-08-23 MED ORDER — ESTRADIOL 0.05 MG/24HR TD PTTW: 1.0000 | MEDICATED_PATCH | TRANSDERMAL | 1 refills | Status: DC

## 2022-08-23 NOTE — Progress Notes (Signed)
Vivelle Dot 0.05 sent per Dr.Forsyth

## 2022-08-29 ENCOUNTER — Encounter: Payer: Self-pay | Admitting: Sports Medicine

## 2022-08-29 DIAGNOSIS — M170 Bilateral primary osteoarthritis of knee: Secondary | ICD-10-CM

## 2022-09-08 DIAGNOSIS — M19011 Primary osteoarthritis, right shoulder: Secondary | ICD-10-CM | POA: Diagnosis not present

## 2022-09-14 ENCOUNTER — Other Ambulatory Visit: Payer: Self-pay | Admitting: Sports Medicine

## 2022-09-18 DIAGNOSIS — M1711 Unilateral primary osteoarthritis, right knee: Secondary | ICD-10-CM | POA: Diagnosis not present

## 2022-09-28 ENCOUNTER — Encounter: Payer: Self-pay | Admitting: Sports Medicine

## 2022-09-28 ENCOUNTER — Other Ambulatory Visit (INDEPENDENT_AMBULATORY_CARE_PROVIDER_SITE_OTHER): Payer: Medicare HMO

## 2022-09-28 ENCOUNTER — Ambulatory Visit (INDEPENDENT_AMBULATORY_CARE_PROVIDER_SITE_OTHER): Payer: Medicare HMO | Admitting: Sports Medicine

## 2022-09-28 VITALS — Wt 157.0 lb

## 2022-09-28 DIAGNOSIS — L989 Disorder of the skin and subcutaneous tissue, unspecified: Secondary | ICD-10-CM | POA: Diagnosis not present

## 2022-09-28 DIAGNOSIS — R635 Abnormal weight gain: Secondary | ICD-10-CM | POA: Diagnosis not present

## 2022-09-28 DIAGNOSIS — M170 Bilateral primary osteoarthritis of knee: Secondary | ICD-10-CM

## 2022-09-28 DIAGNOSIS — M1711 Unilateral primary osteoarthritis, right knee: Secondary | ICD-10-CM | POA: Diagnosis not present

## 2022-09-28 MED ORDER — PHENTERMINE HCL 37.5 MG PO TABS: ORAL_TABLET | ORAL | 0 refills | Status: DC

## 2022-09-28 NOTE — Addendum Note (Signed)
Addended by: Monica Becton on: 09/28/2022 12:46 PM   Modules accepted: Orders

## 2022-09-28 NOTE — Assessment & Plan Note (Signed)
There is also noted a lesion on the left side of her nose, feels somewhat coarse, symptoms have improved to some degree since this popped up a couple weeks ago, I would like to look at this again in 6 weeks to ensure it is completely resolved, if not I would like her to touch base with dermatology for either biopsy or cryotherapy for concern of squamous cell carcinoma.

## 2022-09-28 NOTE — Progress Notes (Addendum)
    Procedures performed today:    Procedure: Real-time Ultrasound Guided injection of the right knee Device: Samsung HS60  Verbal informed consent obtained.  Time-out conducted.  Noted no overlying erythema, induration, or other signs of local infection.  Skin prepped in a sterile fashion.  Local anesthesia: Topical Ethyl chloride.  With sterile technique and under real time ultrasound guidance: No effusion noted 1 cc Kenalog 40, 2 cc lidocaine, 2 cc bupivacaine injected easily Completed without difficulty  Advised to call if fevers/chills, erythema, induration, drainage, or persistent bleeding.  Images permanently stored and available for review in PACS.  Impression: Technically successful ultrasound guided injection.  Independent interpretation of notes and tests performed by another provider:   None.  Brief History, Exam, Impression, and Recommendations:      Primary osteoarthritis of both knees Is a pleasant 72 year old female, she does have known knee osteoarthritis, arthroplasty coming up in October, having increasing pain, we are outside of the 12-week window so we can do another injection today but I would not do another 1 after the 17th of this month.  Non-healing skin lesion of nose There is also noted a lesion on the left side of her nose, feels somewhat coarse, symptoms have improved to some degree since this popped up a couple weeks ago, I would like to look at this again in 6 weeks to ensure it is completely resolved, if not I would like her to touch base with dermatology for either biopsy or cryotherapy for concern of squamous cell carcinoma.  WEIGHT GAIN Krystal would like help losing weight, we can do phentermine for a few months. Return monthly for weight checks and refills.    ____________________________________________ Ihor Austin. Benjamin Stain, M.D., ABFM., CAQSM., AME. Primary Care and Sports Medicine [facility]  Adjunct Professor  of Family Medicine  [place] [employer] [facility] [employer]  Restaurant manager, fast food

## 2022-09-28 NOTE — Assessment & Plan Note (Signed)
Is a pleasant 72 year old female, she does have known knee osteoarthritis, arthroplasty coming up in October, having increasing pain, we are outside of the 12-week window so we can do another injection today but I would not do another 1 after the 17th of this month.

## 2022-09-28 NOTE — Assessment & Plan Note (Addendum)
[name] would like help losing weight, we can do phentermine for a few months. Return monthly for weight checks and refills.

## 2022-10-03 DIAGNOSIS — M1711 Unilateral primary osteoarthritis, right knee: Secondary | ICD-10-CM | POA: Diagnosis not present

## 2022-10-10 DIAGNOSIS — M1711 Unilateral primary osteoarthritis, right knee: Secondary | ICD-10-CM | POA: Diagnosis not present

## 2022-10-18 DIAGNOSIS — M1711 Unilateral primary osteoarthritis, right knee: Secondary | ICD-10-CM | POA: Diagnosis not present

## 2022-10-21 ENCOUNTER — Other Ambulatory Visit: Payer: Self-pay | Admitting: Obstetrics and Gynecology

## 2022-10-21 DIAGNOSIS — N951 Menopausal and female climacteric states: Secondary | ICD-10-CM

## 2022-10-23 ENCOUNTER — Other Ambulatory Visit: Payer: Self-pay | Admitting: Obstetrics and Gynecology

## 2022-10-23 DIAGNOSIS — M1711 Unilateral primary osteoarthritis, right knee: Secondary | ICD-10-CM | POA: Diagnosis not present

## 2022-10-23 MED ORDER — ESTRADIOL 0.0375 MG/24HR TD PTTW: 1.0000 | MEDICATED_PATCH | TRANSDERMAL | 0 refills | Status: DC

## 2022-10-23 NOTE — Telephone Encounter (Signed)
Rx sent separately, working to taper dose

## 2022-11-02 ENCOUNTER — Ambulatory Visit (INDEPENDENT_AMBULATORY_CARE_PROVIDER_SITE_OTHER): Payer: Medicare HMO | Admitting: Sports Medicine

## 2022-11-02 ENCOUNTER — Encounter: Payer: Self-pay | Admitting: Sports Medicine

## 2022-11-02 VITALS — BP 130/65 | Ht 63.0 in | Wt 156.0 lb

## 2022-11-02 DIAGNOSIS — L989 Disorder of the skin and subcutaneous tissue, unspecified: Secondary | ICD-10-CM | POA: Diagnosis not present

## 2022-11-02 DIAGNOSIS — R635 Abnormal weight gain: Secondary | ICD-10-CM

## 2022-11-02 DIAGNOSIS — M1711 Unilateral primary osteoarthritis, right knee: Secondary | ICD-10-CM | POA: Diagnosis not present

## 2022-11-02 MED ORDER — PHENTERMINE HCL 37.5 MG PO TABS: ORAL_TABLET | ORAL | 0 refills | Status: DC

## 2022-11-02 NOTE — Progress Notes (Signed)
    Procedures performed today:    None.  Independent interpretation of notes and tests performed by another provider:   None.  Brief History, Exam, Impression, and Recommendations:    WEIGHT GAIN [name] did have some weight loss, refilling phentermine, will do 2 months worth since she is going to [place] over the next month.  Non-healing skin lesion of nose Would like dermatology to weigh in.  She is established with [name].    ____________________________________________ Ihor Austin. Benjamin Stain, M.D., ABFM., CAQSM., AME. Primary Care and Sports Medicine [facility]  Adjunct Professor of Family Medicine  [place] [employer] [facility] [employer]  Restaurant manager, fast food

## 2022-11-02 NOTE — Assessment & Plan Note (Signed)
Would like dermatology to weigh in.  She is established with Dorcas Mcmurray.

## 2022-11-02 NOTE — Assessment & Plan Note (Signed)
[name] did have some weight loss, refilling phentermine, will do 2 months worth since she is going to [place] over the next month.

## 2022-11-04 ENCOUNTER — Encounter: Payer: Self-pay | Admitting: Sports Medicine

## 2022-11-04 DIAGNOSIS — I10 Essential (primary) hypertension: Secondary | ICD-10-CM

## 2022-11-06 MED ORDER — METOPROLOL SUCCINATE ER 25 MG PO TB24: 25.0000 mg | ORAL_TABLET | Freq: Every day | ORAL | 3 refills | Status: DC

## 2022-11-13 ENCOUNTER — Ambulatory Visit
Admission: EM | Admit: 2022-11-13 | Discharge: 2022-11-13 | Disposition: A | Discharge status: Home / Self Care | Payer: Medicare HMO | Attending: Family Medicine | Admitting: Family Medicine

## 2022-11-13 ENCOUNTER — Encounter: Payer: Self-pay | Admitting: Emergency Medicine

## 2022-11-13 DIAGNOSIS — L570 Actinic keratosis: Secondary | ICD-10-CM | POA: Diagnosis not present

## 2022-11-13 DIAGNOSIS — L821 Other seborrheic keratosis: Secondary | ICD-10-CM | POA: Diagnosis not present

## 2022-11-13 DIAGNOSIS — L814 Other melanin hyperpigmentation: Secondary | ICD-10-CM | POA: Diagnosis not present

## 2022-11-13 DIAGNOSIS — S61412A Laceration without foreign body of left hand, initial encounter: Secondary | ICD-10-CM | POA: Diagnosis not present

## 2022-11-13 NOTE — ED Provider Notes (Signed)
[facility]    CSN: [id number] Arrival date & time: 11/13/22  1007      History   Chief Complaint Chief Complaint  Patient presents with   Laceration    HPI [name] is a 72 y.o. female.   Patient has a laceration to her right hand.  This occurred on Saturday.  She has been trying to keep it taped shut but she is having trouble keeping tape in place.  Is here for wound evaluation.  See HPI    Past Medical History:  Diagnosis Date   Abnormal vaginal Pap smear    monitored annually by Dr. Konrad Dolores   Colon polyps    multple right colon polyps   Degenerative joint disease    GERD (gastroesophageal reflux disease)    Hyperlipidemia    NMR: LDL 120 (1267/584), HDL 59, TG 60. LDL goall<130, ideally <100   Hypertension    Hypokalemia    on HCTZ/Triam   PONV (postoperative nausea and vomiting)    Vitamin D deficiency    2000 IU    Patient Active Problem List   Diagnosis Date Noted   Non-healing skin lesion of nose 09/28/2022   Benign paroxysmal positional vertigo 05/22/2022   Nevus sebaceous of [place] 10/13/2021   Skin tag 07/08/2021   Carpal tunnel syndrome on right 12/28/2020   Preop examination 01/21/2020   Left shoulder osteoarthritis, status post right shoulder arthroplasty 07/11/2019   Xerosis of skin 04/18/2018   Tinea corporis 04/18/2018   Hair loss 04/18/2018   Left foot pain 01/04/2018   Seborrheic dermatitis 04/09/2017   H/O right hemicolectomy 03/22/2016   Screening for colon cancer 07/19/2015   Right cervical radiculopathy 05/26/2015   Seborrheic keratosis 05/26/2015   Bilateral shoulder bursitis 01/14/2015   Primary osteoarthritis of both knees 07/13/2014   Lumbar spondylosis with sacroiliac joint dysfunction 05/25/2014   Annual physical exam 05/25/2014   WEIGHT GAIN 05/09/2010   Hyperlipidemia 04/16/2007   Essential hypertension, benign 04/16/2007    Past Surgical History:  Procedure Laterality Date   ABDOMINAL  HYSTERECTOMY     APPENDECTOMY     BREAST BIOPSY     BUNIONECTOMY  03/2009   CESAREAN SECTION     CHOLECYSTECTOMY  09/2007   Lap, Dr.Gerkin    COLONOSCOPY  2008   virtual colonoscopy   DILATION AND CURETTAGE OF UTERUS     ENDOMETRIAL BIOPSY  04/28/2013   Dr Jennette Kettle   ENDOSCOPIC CONCHA BULLOSA RESECTION Left 03/24/2013   Procedure: ENDOSCOPIC CONCHA BULLOSA RESECTION;  Surgeon: Serena Colonel, MD;  Location: [facility];  Service: ENT;  Laterality: Left;   ETHMOIDECTOMY Left 03/24/2013   Procedure: ETHMOIDECTOMY;  Surgeon: Serena Colonel, MD;  Location: [facility];  Service: ENT;  Laterality: Left;   HAMMER TOE SURGERY     HYSTEROSCOPY     w/ D and C in 09/2005,02/2007,03/2008 (+ poly removal); IDU insertion 09/2008, Dr.Neal   Liposuction and tummy tuck  04/18/2022   Dr. Brennan Bailey   NEURECTOMY FOOT  03/2008   left   NEUROMA SURGERY     left foot ,Dr.Sikora   PARTIAL COLECTOMY Right 03/30/2016   Procedure: RIGHT COLECTOMY;  Surgeon: Darnell Level, MD;  Location: [facility] OR;  Service: General;  Laterality: Right;   TOE SURGERY     for bone spur   TOTAL SHOULDER ARTHROPLASTY Right 02/18/2020   Procedure: TOTAL SHOULDER ARTHROPLASTY;  Surgeon: Bjorn Pippin, MD;  Location: [facility];  Service: Orthopedics;  Laterality: Right;   TOTAL SHOULDER ARTHROPLASTY Left 02/23/2022   Procedure: TOTAL SHOULDER ARTHROPLASTY;  Surgeon: Bjorn Pippin, MD;  Location: [facility];  Service: Orthopedics;  Laterality: Left;   TUBAL LIGATION      OB History     Gravida  2   Para  2   Term  2   Preterm      AB      Living  2      SAB      IAB      Ectopic      Multiple      Live Births               Home Medications    Prior to Admission medications   Medication Sig Start Date End Date Taking? Authorizing Provider  calcium carbonate (OS-CAL) 600 MG tablet Take 600 mg by mouth 2 (two) times daily with a meal.   Yes [provider]  Cholecalciferol (VITAMIN D3) 125 MCG (5000 UT) CAPS Take 5,000 Units by mouth daily.   Yes [provider]  estradiol (VIVELLE-DOT) 0.0375 MG/24HR Place 1 patch onto the skin 2 (two) times a week. 10/23/22  Yes Lennart Pall, MD  famotidine (PEPCID AC) 10 MG chewable tablet Chew 10 mg by mouth as needed.   Yes [provider]  meloxicam (MOBIC) 15 MG tablet TAKE 1 TABLET DAILY 09/14/22  Yes Monica Becton, MD  metoprolol succinate (TOPROL-XL) 25 MG 24 hr tablet Take 1 tablet (25 mg total) by mouth daily. 11/06/22  Yes Monica Becton, MD  Misc Natural Products (PRO NUTRIENTS FRUIT & VEGGIE PO) Take by mouth.   Yes [provider]  phentermine (ADIPEX-P) 37.5 MG tablet One tab by mouth qAM 11/02/22  Yes Monica Becton, MD  pravastatin (PRAVACHOL) 40 MG tablet Take 1 tablet (40 mg total) by mouth daily. 07/11/22  Yes Monica Becton, MD  spironolactone (ALDACTONE) 25 MG tablet TAKE 1 TABLET TWICE A DAY 08/13/22  Yes Monica Becton, MD  tiZANidine (ZANAFLEX) 4 MG tablet Take 1 tablet (4 mg total) by mouth every 6 (six) hours as needed for muscle spasms. 07/11/22  Yes Monica Becton, MD  Triamcinolone Acetonide (TRIAMCINOLONE 0.1 % CREAM : EUCERIN) CREA Apply 1 application topically 2 (two) times daily. 10/21/19  Yes Monica Becton, MD  zolpidem (AMBIEN) 5 MG tablet Take 1 tablet (5 mg total) by mouth at bedtime as needed. 07/11/22  Yes Monica Becton, MD  omeprazole (PRILOSEC) 20 MG capsule Take 1 capsule (20 mg total) by mouth daily. To gastric protection while taking NSAIDs 02/23/22 06/27/22  McBane, Jerald Kief, PA-C    Family History Family History  Problem Relation Age of Onset   Heart attack Father 29       CABG 4-vessel   Diabetes Father        late onset   Lung cancer Mother        former smoker   Diabetes Mother        late onset   Breast cancer Mother    Obesity Sister    Stroke  Paternal Grandfather 32   Lung cancer Maternal Grandfather        former smoker   Breast cancer Maternal Aunt    Heart attack Paternal Grandmother 24   Diabetes type I Other        grand daughter    Social History Social  History   Tobacco Use   Smoking status: Never   Smokeless tobacco: Never  Vaping Use   Vaping status: Never Used  Substance Use Topics   Alcohol use: Yes    Comment: occasional   Drug use: No     Allergies   Hydrochlorothiazide w-triamterene, Tramadol, and Povidone-iodine   Review of Systems Review of Systems    Physical Exam Triage Vital Signs ED Triage Vitals  Encounter Vitals Group     BP 11/13/22 1024 135/79     Systolic BP Percentile --      Diastolic BP Percentile --      Pulse Rate 11/13/22 1024 66     Resp 11/13/22 1024 16     Temp 11/13/22 1024 (!) 97.5 F (36.4 C)     Temp Source 11/13/22 1024 Oral     SpO2 11/13/22 1024 99 %     Weight 11/13/22 1026 152 lb (68.9 kg)     Height 11/13/22 1026 5\' 2"  (1.575 m)     Head Circumference --      Peak Flow --      Pain Score 11/13/22 1026 2     Pain Loc --      Pain Education --      Exclude from Growth Chart --    No data found.  Updated Vital Signs BP 135/79 (BP Location: Left Arm)   Pulse 66   Temp (!) 97.5 F (36.4 C) (Oral)   Resp 16   Ht 5\' 2"  (1.575 m)   Wt 68.9 kg   SpO2 99%   BMI 27.80 kg/m   Physical Exam Constitutional:      General: She is not in acute distress.    Appearance: She is well-developed.  HENT:     Head: Normocephalic and atraumatic.  Eyes:     Conjunctiva/sclera: Conjunctivae normal.     Pupils: Pupils are equal, round, and reactive to light.  Cardiovascular:     Rate and Rhythm: Normal rate.  Pulmonary:     Effort: Pulmonary effort is normal. No respiratory distress.  Abdominal:     General: There is no distension.     Palpations: Abdomen is soft.  Musculoskeletal:        General: Normal range of motion.     Cervical back: Normal range  of motion.  Skin:    General: Skin is warm and dry.     Comments: On the left hand in the webspace between the thumb and index finger there is a 2 cm laceration that appears to be closed.  Skin is macerated from moisture under her dressing  Neurological:     Mental Status: She is alert.      UC Treatments / Results  Labs (all labs ordered are listed, but only abnormal results are displayed) Labs Reviewed - No data to display  EKG   Radiology No results found.  Procedures Area was cleansed with a skin cleanser.  Allowed to dry.  Dermabond placed.  Medications Ordered in UC Medications - No data to display  Initial Impression / Assessment and Plan / UC Course  I have reviewed the triage vital signs and the nursing notes.  Pertinent labs & imaging results that were available during my care of the patient were reviewed by me and considered in my medical decision making (see chart for details).     Wound care discussed Final Clinical Impressions(s) / UC Diagnoses   Final diagnoses:  Laceration of left hand  without foreign body, initial encounter     Discharge Instructions      Return if needed   ED Prescriptions   None    PDMP not reviewed this encounter.   Eustace Moore, MD 11/13/22 [phone]

## 2022-11-13 NOTE — ED Triage Notes (Signed)
Patient states that she was hit in the right hand by a softball and it caused a laceration.  The area comes together with steri-strips really well, but her hand stays sweaty and the area keeps opening up.  Patient is current on Tdap.

## 2022-11-13 NOTE — Discharge Instructions (Signed)
Return if needed

## 2022-11-15 ENCOUNTER — Ambulatory Visit (INDEPENDENT_AMBULATORY_CARE_PROVIDER_SITE_OTHER): Payer: Medicare HMO

## 2022-11-15 DIAGNOSIS — Z1231 Encounter for screening mammogram for malignant neoplasm of breast: Secondary | ICD-10-CM | POA: Diagnosis not present

## 2022-12-11 ENCOUNTER — Other Ambulatory Visit: Payer: Self-pay | Admitting: Sports Medicine

## 2022-12-18 ENCOUNTER — Other Ambulatory Visit: Payer: Self-pay | Admitting: Sports Medicine

## 2022-12-18 DIAGNOSIS — M1711 Unilateral primary osteoarthritis, right knee: Secondary | ICD-10-CM | POA: Diagnosis not present

## 2022-12-18 DIAGNOSIS — M25561 Pain in right knee: Secondary | ICD-10-CM | POA: Diagnosis not present

## 2022-12-18 DIAGNOSIS — M19011 Primary osteoarthritis, right shoulder: Secondary | ICD-10-CM | POA: Diagnosis not present

## 2022-12-20 ENCOUNTER — Encounter (INDEPENDENT_AMBULATORY_CARE_PROVIDER_SITE_OTHER): Payer: Self-pay | Admitting: Sports Medicine

## 2022-12-20 DIAGNOSIS — J00 Acute nasopharyngitis [common cold]: Secondary | ICD-10-CM

## 2022-12-20 NOTE — Telephone Encounter (Signed)

## 2022-12-26 ENCOUNTER — Telehealth: Payer: Self-pay | Admitting: Sports Medicine

## 2022-12-26 NOTE — Telephone Encounter (Signed)
Sherri from [facility] called requesting surgical clearance for pt. Pt is scheduled to have surgery on 10/17.

## 2022-12-28 ENCOUNTER — Ambulatory Visit: Payer: Medicare HMO | Admitting: Sports Medicine

## 2022-12-28 NOTE — Telephone Encounter (Signed)
I signed that and placed it in [facility] box yesterday to be faxed.

## 2023-01-02 DIAGNOSIS — G8918 Other acute postprocedural pain: Secondary | ICD-10-CM | POA: Diagnosis not present

## 2023-01-02 DIAGNOSIS — M1711 Unilateral primary osteoarthritis, right knee: Secondary | ICD-10-CM | POA: Diagnosis not present

## 2023-01-02 DIAGNOSIS — M25761 Osteophyte, right knee: Secondary | ICD-10-CM | POA: Diagnosis not present

## 2023-01-08 ENCOUNTER — Other Ambulatory Visit: Payer: Self-pay | Admitting: Obstetrics and Gynecology

## 2023-01-08 HISTORY — PX: OTHER SURGICAL HISTORY: SHX169

## 2023-01-08 NOTE — Telephone Encounter (Signed)
Needs lower dose, we are tapering

## 2023-01-12 ENCOUNTER — Other Ambulatory Visit: Payer: Self-pay | Admitting: Obstetrics and Gynecology

## 2023-01-12 MED ORDER — ESTRADIOL 0.025 MG/24HR TD PTTW: 1.0000 | MEDICATED_PATCH | TRANSDERMAL | 0 refills | Status: DC

## 2023-01-12 NOTE — Progress Notes (Signed)
Rx sent for lowest dose of estradiol patch (had previously planned to taper)

## 2023-01-12 NOTE — Telephone Encounter (Signed)
Sent with updated dose

## 2023-01-15 DIAGNOSIS — Z471 Aftercare following joint replacement surgery: Secondary | ICD-10-CM | POA: Diagnosis not present

## 2023-01-15 DIAGNOSIS — Z96651 Presence of right artificial knee joint: Secondary | ICD-10-CM | POA: Diagnosis not present

## 2023-01-15 DIAGNOSIS — G478 Other sleep disorders: Secondary | ICD-10-CM | POA: Diagnosis not present

## 2023-01-15 DIAGNOSIS — R2689 Other abnormalities of gait and mobility: Secondary | ICD-10-CM | POA: Diagnosis not present

## 2023-01-15 DIAGNOSIS — M25561 Pain in right knee: Secondary | ICD-10-CM | POA: Diagnosis not present

## 2023-01-15 DIAGNOSIS — R6889 Other general symptoms and signs: Secondary | ICD-10-CM | POA: Diagnosis not present

## 2023-01-17 DIAGNOSIS — M1711 Unilateral primary osteoarthritis, right knee: Secondary | ICD-10-CM | POA: Diagnosis not present

## 2023-01-17 DIAGNOSIS — G478 Other sleep disorders: Secondary | ICD-10-CM | POA: Diagnosis not present

## 2023-01-17 DIAGNOSIS — M25561 Pain in right knee: Secondary | ICD-10-CM | POA: Diagnosis not present

## 2023-01-17 DIAGNOSIS — Z471 Aftercare following joint replacement surgery: Secondary | ICD-10-CM | POA: Diagnosis not present

## 2023-01-17 DIAGNOSIS — Z96651 Presence of right artificial knee joint: Secondary | ICD-10-CM | POA: Diagnosis not present

## 2023-01-17 DIAGNOSIS — R6889 Other general symptoms and signs: Secondary | ICD-10-CM | POA: Diagnosis not present

## 2023-01-17 DIAGNOSIS — R2689 Other abnormalities of gait and mobility: Secondary | ICD-10-CM | POA: Diagnosis not present

## 2023-01-22 DIAGNOSIS — R2689 Other abnormalities of gait and mobility: Secondary | ICD-10-CM | POA: Diagnosis not present

## 2023-01-22 DIAGNOSIS — R6889 Other general symptoms and signs: Secondary | ICD-10-CM | POA: Diagnosis not present

## 2023-01-22 DIAGNOSIS — Z96651 Presence of right artificial knee joint: Secondary | ICD-10-CM | POA: Diagnosis not present

## 2023-01-22 DIAGNOSIS — Z471 Aftercare following joint replacement surgery: Secondary | ICD-10-CM | POA: Diagnosis not present

## 2023-01-22 DIAGNOSIS — M25561 Pain in right knee: Secondary | ICD-10-CM | POA: Diagnosis not present

## 2023-01-22 DIAGNOSIS — G478 Other sleep disorders: Secondary | ICD-10-CM | POA: Diagnosis not present

## 2023-01-24 DIAGNOSIS — Z96651 Presence of right artificial knee joint: Secondary | ICD-10-CM | POA: Diagnosis not present

## 2023-01-24 DIAGNOSIS — G478 Other sleep disorders: Secondary | ICD-10-CM | POA: Diagnosis not present

## 2023-01-24 DIAGNOSIS — M25561 Pain in right knee: Secondary | ICD-10-CM | POA: Diagnosis not present

## 2023-01-24 DIAGNOSIS — R6889 Other general symptoms and signs: Secondary | ICD-10-CM | POA: Diagnosis not present

## 2023-01-24 DIAGNOSIS — R2689 Other abnormalities of gait and mobility: Secondary | ICD-10-CM | POA: Diagnosis not present

## 2023-01-24 DIAGNOSIS — Z471 Aftercare following joint replacement surgery: Secondary | ICD-10-CM | POA: Diagnosis not present

## 2023-01-29 ENCOUNTER — Encounter: Payer: Self-pay | Admitting: Sports Medicine

## 2023-01-30 DIAGNOSIS — M25561 Pain in right knee: Secondary | ICD-10-CM | POA: Diagnosis not present

## 2023-01-30 DIAGNOSIS — Z96651 Presence of right artificial knee joint: Secondary | ICD-10-CM | POA: Diagnosis not present

## 2023-01-30 DIAGNOSIS — R2689 Other abnormalities of gait and mobility: Secondary | ICD-10-CM | POA: Diagnosis not present

## 2023-01-30 DIAGNOSIS — Z471 Aftercare following joint replacement surgery: Secondary | ICD-10-CM | POA: Diagnosis not present

## 2023-01-30 DIAGNOSIS — R6889 Other general symptoms and signs: Secondary | ICD-10-CM | POA: Diagnosis not present

## 2023-01-30 DIAGNOSIS — G478 Other sleep disorders: Secondary | ICD-10-CM | POA: Diagnosis not present

## 2023-01-30 MED ORDER — SPIRONOLACTONE 25 MG PO TABS: 25.0000 mg | ORAL_TABLET | Freq: Two times a day (BID) | ORAL | 1 refills | Status: DC

## 2023-01-30 NOTE — Telephone Encounter (Signed)
Forwarding to PCP.

## 2023-02-01 DIAGNOSIS — G478 Other sleep disorders: Secondary | ICD-10-CM | POA: Diagnosis not present

## 2023-02-01 DIAGNOSIS — Z471 Aftercare following joint replacement surgery: Secondary | ICD-10-CM | POA: Diagnosis not present

## 2023-02-01 DIAGNOSIS — Z96651 Presence of right artificial knee joint: Secondary | ICD-10-CM | POA: Diagnosis not present

## 2023-02-01 DIAGNOSIS — R2689 Other abnormalities of gait and mobility: Secondary | ICD-10-CM | POA: Diagnosis not present

## 2023-02-01 DIAGNOSIS — M25561 Pain in right knee: Secondary | ICD-10-CM | POA: Diagnosis not present

## 2023-02-01 DIAGNOSIS — R6889 Other general symptoms and signs: Secondary | ICD-10-CM | POA: Diagnosis not present

## 2023-02-05 DIAGNOSIS — Z96651 Presence of right artificial knee joint: Secondary | ICD-10-CM | POA: Diagnosis not present

## 2023-02-05 DIAGNOSIS — R6889 Other general symptoms and signs: Secondary | ICD-10-CM | POA: Diagnosis not present

## 2023-02-05 DIAGNOSIS — Z471 Aftercare following joint replacement surgery: Secondary | ICD-10-CM | POA: Diagnosis not present

## 2023-02-05 DIAGNOSIS — M25561 Pain in right knee: Secondary | ICD-10-CM | POA: Diagnosis not present

## 2023-02-05 DIAGNOSIS — R2689 Other abnormalities of gait and mobility: Secondary | ICD-10-CM | POA: Diagnosis not present

## 2023-02-05 DIAGNOSIS — G478 Other sleep disorders: Secondary | ICD-10-CM | POA: Diagnosis not present

## 2023-02-07 DIAGNOSIS — Z96651 Presence of right artificial knee joint: Secondary | ICD-10-CM | POA: Diagnosis not present

## 2023-02-07 DIAGNOSIS — G478 Other sleep disorders: Secondary | ICD-10-CM | POA: Diagnosis not present

## 2023-02-07 DIAGNOSIS — R2689 Other abnormalities of gait and mobility: Secondary | ICD-10-CM | POA: Diagnosis not present

## 2023-02-07 DIAGNOSIS — M25561 Pain in right knee: Secondary | ICD-10-CM | POA: Diagnosis not present

## 2023-02-07 DIAGNOSIS — Z471 Aftercare following joint replacement surgery: Secondary | ICD-10-CM | POA: Diagnosis not present

## 2023-02-07 DIAGNOSIS — R6889 Other general symptoms and signs: Secondary | ICD-10-CM | POA: Diagnosis not present

## 2023-02-12 DIAGNOSIS — M25561 Pain in right knee: Secondary | ICD-10-CM | POA: Diagnosis not present

## 2023-02-12 DIAGNOSIS — Z471 Aftercare following joint replacement surgery: Secondary | ICD-10-CM | POA: Diagnosis not present

## 2023-02-12 DIAGNOSIS — R6889 Other general symptoms and signs: Secondary | ICD-10-CM | POA: Diagnosis not present

## 2023-02-12 DIAGNOSIS — Z96651 Presence of right artificial knee joint: Secondary | ICD-10-CM | POA: Diagnosis not present

## 2023-02-12 DIAGNOSIS — R2689 Other abnormalities of gait and mobility: Secondary | ICD-10-CM | POA: Diagnosis not present

## 2023-02-12 DIAGNOSIS — G478 Other sleep disorders: Secondary | ICD-10-CM | POA: Diagnosis not present

## 2023-02-21 DIAGNOSIS — R2689 Other abnormalities of gait and mobility: Secondary | ICD-10-CM | POA: Diagnosis not present

## 2023-02-21 DIAGNOSIS — R6889 Other general symptoms and signs: Secondary | ICD-10-CM | POA: Diagnosis not present

## 2023-02-21 DIAGNOSIS — G478 Other sleep disorders: Secondary | ICD-10-CM | POA: Diagnosis not present

## 2023-02-21 DIAGNOSIS — M25561 Pain in right knee: Secondary | ICD-10-CM | POA: Diagnosis not present

## 2023-02-21 DIAGNOSIS — Z471 Aftercare following joint replacement surgery: Secondary | ICD-10-CM | POA: Diagnosis not present

## 2023-02-21 DIAGNOSIS — Z96651 Presence of right artificial knee joint: Secondary | ICD-10-CM | POA: Diagnosis not present

## 2023-02-22 ENCOUNTER — Other Ambulatory Visit (INDEPENDENT_AMBULATORY_CARE_PROVIDER_SITE_OTHER): Payer: Medicare HMO

## 2023-02-22 ENCOUNTER — Ambulatory Visit (INDEPENDENT_AMBULATORY_CARE_PROVIDER_SITE_OTHER): Payer: Medicare HMO | Admitting: Sports Medicine

## 2023-02-22 ENCOUNTER — Encounter: Payer: Self-pay | Admitting: Sports Medicine

## 2023-02-22 DIAGNOSIS — M533 Sacrococcygeal disorders, not elsewhere classified: Secondary | ICD-10-CM

## 2023-02-22 DIAGNOSIS — M47816 Spondylosis without myelopathy or radiculopathy, lumbar region: Secondary | ICD-10-CM | POA: Diagnosis not present

## 2023-02-22 NOTE — Assessment & Plan Note (Signed)
This very pleasant 72 year old female returns, she has chronic sacroiliac joint pain, left-sided historically, now having more right sided pain, requesting a right sided SI joint injection today. We did the right sided SI joint injection with ultrasound guidance, I would like her to do some home physical therapy, and she can return to see me 6 weeks as needed.

## 2023-02-22 NOTE — Progress Notes (Signed)
    Procedures performed today:    Procedure: Real-time Ultrasound Guided injection of the right sacroiliac joint Device: Samsung HS60  Verbal informed consent obtained.  Time-out conducted.  Noted no overlying erythema, induration, or other signs of local infection.  Skin prepped in a sterile fashion.  Local anesthesia: Topical Ethyl chloride.  With sterile technique and under real time ultrasound guidance: Minimally arthritic joint noted, 1 cc Kenalog 40, 2 cc lidocaine, 2 cc bupivacaine injected easily Completed without difficulty  Advised to call if fevers/chills, erythema, induration, drainage, or persistent bleeding.  Images permanently stored and available for review in PACS.  Impression: Technically successful ultrasound guided injection.  Independent interpretation of notes and tests performed by another provider:   None.  Brief History, Exam, Impression, and Recommendations:    Lumbar spondylosis with sacroiliac joint dysfunction This very pleasant 72 year old female returns, she has chronic sacroiliac joint pain, left-sided historically, now having more right sided pain, requesting a right sided SI joint injection today. We did the right sided SI joint injection with ultrasound guidance, I would like her to do some home physical therapy, and she can return to see me 6 weeks as needed.    ____________________________________________ Ihor Austin. Benjamin Stain, M.D., ABFM., CAQSM., AME. Primary Care and Sports Medicine [facility]  Adjunct Professor of Family Medicine  [place] [employer] [facility] [employer]  Restaurant manager, fast food

## 2023-02-23 DIAGNOSIS — M25561 Pain in right knee: Secondary | ICD-10-CM | POA: Diagnosis not present

## 2023-02-23 DIAGNOSIS — Z96651 Presence of right artificial knee joint: Secondary | ICD-10-CM | POA: Diagnosis not present

## 2023-02-23 DIAGNOSIS — Z471 Aftercare following joint replacement surgery: Secondary | ICD-10-CM | POA: Diagnosis not present

## 2023-02-23 DIAGNOSIS — R6889 Other general symptoms and signs: Secondary | ICD-10-CM | POA: Diagnosis not present

## 2023-02-23 DIAGNOSIS — R2689 Other abnormalities of gait and mobility: Secondary | ICD-10-CM | POA: Diagnosis not present

## 2023-02-23 DIAGNOSIS — G478 Other sleep disorders: Secondary | ICD-10-CM | POA: Diagnosis not present

## 2023-02-26 DIAGNOSIS — R6889 Other general symptoms and signs: Secondary | ICD-10-CM | POA: Diagnosis not present

## 2023-02-26 DIAGNOSIS — G478 Other sleep disorders: Secondary | ICD-10-CM | POA: Diagnosis not present

## 2023-02-26 DIAGNOSIS — Z96651 Presence of right artificial knee joint: Secondary | ICD-10-CM | POA: Diagnosis not present

## 2023-02-26 DIAGNOSIS — R2689 Other abnormalities of gait and mobility: Secondary | ICD-10-CM | POA: Diagnosis not present

## 2023-02-26 DIAGNOSIS — Z471 Aftercare following joint replacement surgery: Secondary | ICD-10-CM | POA: Diagnosis not present

## 2023-02-26 DIAGNOSIS — M25561 Pain in right knee: Secondary | ICD-10-CM | POA: Diagnosis not present

## 2023-02-27 DIAGNOSIS — M19011 Primary osteoarthritis, right shoulder: Secondary | ICD-10-CM | POA: Diagnosis not present

## 2023-02-27 DIAGNOSIS — M19012 Primary osteoarthritis, left shoulder: Secondary | ICD-10-CM | POA: Diagnosis not present

## 2023-03-07 DIAGNOSIS — R2689 Other abnormalities of gait and mobility: Secondary | ICD-10-CM | POA: Diagnosis not present

## 2023-03-07 DIAGNOSIS — Z471 Aftercare following joint replacement surgery: Secondary | ICD-10-CM | POA: Diagnosis not present

## 2023-03-07 DIAGNOSIS — R6889 Other general symptoms and signs: Secondary | ICD-10-CM | POA: Diagnosis not present

## 2023-03-07 DIAGNOSIS — M25561 Pain in right knee: Secondary | ICD-10-CM | POA: Diagnosis not present

## 2023-03-07 DIAGNOSIS — Z96651 Presence of right artificial knee joint: Secondary | ICD-10-CM | POA: Diagnosis not present

## 2023-03-07 DIAGNOSIS — G478 Other sleep disorders: Secondary | ICD-10-CM | POA: Diagnosis not present

## 2023-03-20 ENCOUNTER — Other Ambulatory Visit: Payer: Self-pay | Admitting: Sports Medicine

## 2023-03-25 ENCOUNTER — Other Ambulatory Visit: Payer: Self-pay | Admitting: Sports Medicine

## 2023-04-05 ENCOUNTER — Encounter: Payer: Self-pay | Admitting: Sports Medicine

## 2023-04-05 ENCOUNTER — Ambulatory Visit (INDEPENDENT_AMBULATORY_CARE_PROVIDER_SITE_OTHER): Payer: Medicare HMO | Admitting: Sports Medicine

## 2023-04-05 DIAGNOSIS — K219 Gastro-esophageal reflux disease without esophagitis: Secondary | ICD-10-CM

## 2023-04-05 MED ORDER — ESOMEPRAZOLE MAGNESIUM 40 MG PO CPDR: 40.0000 mg | DELAYED_RELEASE_CAPSULE | Freq: Two times a day (BID) | ORAL | 3 refills | Status: DC

## 2023-04-05 NOTE — Progress Notes (Signed)
    Procedures performed today:    None.  Independent interpretation of notes and tests performed by another provider:   None.  Brief History, Exam, Impression, and Recommendations:    GERD (gastroesophageal reflux disease) Pleasant 73 year old female, history of right hemicolectomy in 2018, she does have known GERD, historically controlled with Nexium. She has noted worsening symptoms of midepigastric discomfort and retrosternal burning, no melena, hematochezia, hematemesis. Nexium twice a day seems to help, she dropped it back down to once daily and she is having increasing discomfort We will go and check some labs to ensure no GI bleed, we will also check an H. pylori breath test, she did take a PPI recently, this can decrease the sensitivity of the H. pylori breath test but I do still think we need to proceed, she will also go back up to twice daily on her PPI. We will also have her do some home dietary changes for GERD and low FODMAP diet. If insufficient improvement after 6 weeks we will refer to GI for upper endoscopy.    ____________________________________________ Ihor Austin. Benjamin Stain, M.D., ABFM., CAQSM., AME. Primary Care and Sports Medicine [place] [facility]  Adjunct Professor of Family Medicine  [place] of [facility] [employer]  Restaurant manager, fast food

## 2023-04-05 NOTE — Assessment & Plan Note (Addendum)
Pleasant 73 year old female, history of right hemicolectomy in 2018, she does have known GERD, historically controlled with Nexium. She has noted worsening symptoms of midepigastric discomfort and retrosternal burning, no melena, hematochezia, hematemesis. Nexium twice a day seems to help, she dropped it back down to once daily and she is having increasing discomfort We will go and check some labs to ensure no GI bleed, we will also check an H. pylori breath test, she did take a PPI recently, this can decrease the sensitivity of the H. pylori breath test but I do still think we need to proceed, she will also go back up to twice daily on her PPI. We will also have her do some home dietary changes for GERD and low FODMAP diet. If insufficient improvement after 6 weeks we will refer to GI for upper endoscopy.

## 2023-04-06 ENCOUNTER — Other Ambulatory Visit: Payer: Self-pay | Admitting: Obstetrics and Gynecology

## 2023-04-06 MED ORDER — ZOLPIDEM TARTRATE 10 MG PO TABS: 10.0000 mg | ORAL_TABLET | Freq: Every evening | ORAL | 1 refills | Status: AC | PRN

## 2023-04-07 ENCOUNTER — Encounter: Payer: Self-pay | Admitting: Sports Medicine

## 2023-04-07 LAB — COMPREHENSIVE METABOLIC PANEL WITH GFR
Alkaline Phosphatase: 102 IU/L (ref 44–121)
BUN/Creatinine Ratio: 17 (ref 12–28)
BUN: 16 mg/dL (ref 8–27)
CO2: 23 mmol/L (ref 20–29)
Calcium: 9.9 mg/dL (ref 8.7–10.3)
Creatinine, Ser: 0.95 mg/dL (ref 0.57–1.00)
Globulin, Total: 2.4 g/dL (ref 1.5–4.5)
Potassium: 4.8 mmol/L (ref 3.5–5.2)
Sodium: 137 mmol/L (ref 134–144)
Total Protein: 6.8 g/dL (ref 6.0–8.5)
eGFR: 64 mL/min/1.73 (ref >59)

## 2023-04-07 LAB — CBC WITH DIFFERENTIAL/PLATELET
Basophils Absolute: 0.1 x10E3/uL (ref 0.0–0.2)
Basos: 1 %
EOS (ABSOLUTE): 0.1 10*3/uL (ref 0.0–0.4)
Eos: 2 %
Hematocrit: 44 % (ref 34.0–46.6)
Hemoglobin: 14.1 g/dL (ref 11.1–15.9)
Immature Grans (Abs): 0 10*3/uL (ref 0.0–0.1)
Immature Granulocytes: 0 %
Lymphocytes Absolute: 1.7 x10E3/uL (ref 0.7–3.1)
Lymphs: 25 %
MCH: 31.5 pg (ref 26.6–33.0)
MCHC: 32 g/dL (ref 31.5–35.7)
MCV: 98 fL — ABNORMAL HIGH (ref 79–97)
Monocytes Absolute: 0.4 x10E3/uL (ref 0.1–0.9)
Monocytes: 7 %
Neutrophils Absolute: 4.5 10*3/uL (ref 1.4–7.0)
Neutrophils: 65 %
Platelets: 300 x10E3/uL (ref 150–450)
RBC: 4.48 x10E6/uL (ref 3.77–5.28)
RDW: 11.8 % (ref 11.7–15.4)
WBC: 6.8 10*3/uL (ref 3.4–10.8)

## 2023-04-07 LAB — COMPREHENSIVE METABOLIC PANEL
ALT: 34 [IU]/L — ABNORMAL HIGH (ref 0–32)
AST: 30 [IU]/L (ref 0–40)
Albumin: 4.4 g/dL (ref 3.9–4.9)
Bilirubin Total: 0.5 mg/dL (ref 0.0–1.2)
Chloride: 97 mmol/L (ref 96–106)
Glucose: 87 mg/dL (ref 70–99)

## 2023-04-07 LAB — LIPASE: Lipase: 50 U/L (ref 14–72)

## 2023-04-07 LAB — H. PYLORI BREATH TEST: H pylori Breath Test: NEGATIVE

## 2023-04-07 LAB — AMYLASE: Amylase: 82 U/L (ref 31–110)

## 2023-04-11 ENCOUNTER — Encounter: Payer: Self-pay | Admitting: Sports Medicine

## 2023-04-15 ENCOUNTER — Encounter: Payer: Self-pay | Admitting: Sports Medicine

## 2023-04-16 ENCOUNTER — Other Ambulatory Visit: Payer: Self-pay | Admitting: Sports Medicine

## 2023-04-16 DIAGNOSIS — I10 Essential (primary) hypertension: Secondary | ICD-10-CM

## 2023-05-17 ENCOUNTER — Telehealth: Payer: Self-pay | Admitting: Sports Medicine

## 2023-05-17 ENCOUNTER — Ambulatory Visit (INDEPENDENT_AMBULATORY_CARE_PROVIDER_SITE_OTHER): Payer: Medicare HMO | Admitting: Sports Medicine

## 2023-05-17 ENCOUNTER — Encounter: Payer: Self-pay | Admitting: Sports Medicine

## 2023-05-17 DIAGNOSIS — K219 Gastro-esophageal reflux disease without esophagitis: Secondary | ICD-10-CM

## 2023-05-17 DIAGNOSIS — M170 Bilateral primary osteoarthritis of knee: Secondary | ICD-10-CM

## 2023-05-17 DIAGNOSIS — Z0000 Encounter for general adult medical examination without abnormal findings: Secondary | ICD-10-CM

## 2023-05-17 DIAGNOSIS — Z13820 Encounter for screening for osteoporosis: Secondary | ICD-10-CM

## 2023-05-17 MED ORDER — ESOMEPRAZOLE MAGNESIUM 40 MG PO CPDR: 40.0000 mg | DELAYED_RELEASE_CAPSULE | Freq: Two times a day (BID) | ORAL | 3 refills | Status: AC

## 2023-05-17 NOTE — Assessment & Plan Note (Signed)
 [name] has known left knee osteoarthritis, x-ray confirmed, she has failed over 6 weeks of physician directed conservative treatment, oral analgesics, proceeding with Visco approval. She is status post right knee arthroplasty.

## 2023-05-17 NOTE — Telephone Encounter (Signed)
 Left knee Orthovisc approval please

## 2023-05-17 NOTE — Assessment & Plan Note (Signed)
 This very pleasant 73 year old female returns, history of right hemicolectomy in 2018, known GERD, historically controlled with Nexium, at the last visit she was having increasing midepigastric discomfort, retrosternal burning, no melena, hematochezia, hematemesis. She had bumped her Nexium up to twice a day, we did do an H. pylori test, we also check some other labs, all came back normal. I had her do a low FODMAP and GERD diet, she returns today doing extremely well. We will refill her Nexium, I would like her to drop it back down to once daily dosing, but I am happy to refill it for up to twice daily use.

## 2023-05-17 NOTE — Progress Notes (Signed)
    Procedures performed today:    None.  Independent interpretation of notes and tests performed by another provider:   None.  Brief History, Exam, Impression, and Recommendations:    GERD (gastroesophageal reflux disease) This very pleasant 73 year old female returns, history of right hemicolectomy in 2018, known GERD, historically controlled with Nexium, at the last visit she was having increasing midepigastric discomfort, retrosternal burning, no melena, hematochezia, hematemesis. She had bumped her Nexium up to twice a day, we did do an H. pylori test, we also check some other labs, all came back normal. I had her do a low FODMAP and GERD diet, she returns today doing extremely well. We will refill her Nexium, I would like her to drop it back down to once daily dosing, but I am happy to refill it for up to twice daily use.  Primary osteoarthritis of left knee status post right knee arthroplasty Corinn has known left knee osteoarthritis, x-ray confirmed, she has failed over 6 weeks of physician directed conservative treatment, oral analgesics, proceeding with Visco approval. She is status post right knee arthroplasty.    ____________________________________________ Ihor Austin. Benjamin Stain, M.D., ABFM., CAQSM., AME. Primary Care and Sports Medicine [place] [facility]  Adjunct Professor of Family Medicine  [place] of [facility] of [employer]  Restaurant manager, fast food

## 2023-05-23 NOTE — Telephone Encounter (Addendum)
 Benefits Investigation Details received from [employer] Injection: Orthovisc PA required: Yes May fill through: Buy and Bill  OV Copay/Coinsurance: $30 Product Copay: 20% Administration Coinsurance: $30 Administration Copay: $30 Deductible: $ 4150($0 has been met)  Both the product and procedure are covered at 100% Prior authorization is required  for the drug through [place], forms have been faxed over. To [phone]   Prior authorization has been approved  from 05/24/23-11/24/2023 Called pt to inform of co-pay and to schedule front desk has been notified  to reach out and confirm scheduling

## 2023-05-24 ENCOUNTER — Other Ambulatory Visit: Payer: Self-pay | Admitting: Sports Medicine

## 2023-05-26 ENCOUNTER — Other Ambulatory Visit: Payer: Self-pay | Admitting: Sports Medicine

## 2023-05-28 ENCOUNTER — Encounter: Payer: Self-pay | Admitting: Sports Medicine

## 2023-06-04 DIAGNOSIS — Z1211 Encounter for screening for malignant neoplasm of colon: Secondary | ICD-10-CM | POA: Diagnosis not present

## 2023-06-04 DIAGNOSIS — Z09 Encounter for follow-up examination after completed treatment for conditions other than malignant neoplasm: Secondary | ICD-10-CM | POA: Diagnosis not present

## 2023-06-04 DIAGNOSIS — K635 Polyp of colon: Secondary | ICD-10-CM | POA: Diagnosis not present

## 2023-06-04 DIAGNOSIS — D124 Benign neoplasm of descending colon: Secondary | ICD-10-CM | POA: Diagnosis not present

## 2023-06-04 DIAGNOSIS — Z860101 Personal history of adenomatous and serrated colon polyps: Secondary | ICD-10-CM | POA: Diagnosis not present

## 2023-06-04 DIAGNOSIS — K5730 Diverticulosis of large intestine without perforation or abscess without bleeding: Secondary | ICD-10-CM | POA: Diagnosis not present

## 2023-06-07 ENCOUNTER — Ambulatory Visit: Admitting: Sports Medicine

## 2023-06-07 ENCOUNTER — Other Ambulatory Visit (INDEPENDENT_AMBULATORY_CARE_PROVIDER_SITE_OTHER): Payer: Self-pay

## 2023-06-07 DIAGNOSIS — M170 Bilateral primary osteoarthritis of knee: Secondary | ICD-10-CM

## 2023-06-07 MED ORDER — HYALURONAN 30 MG/2ML IX SOSY
15.0000 mg | PREFILLED_SYRINGE | Freq: Once | INTRA_ARTICULAR | Status: AC
  Administered 2023-06-07: 15 mg via INTRA_ARTICULAR

## 2023-06-07 NOTE — Progress Notes (Addendum)
    Procedures performed today:    Procedure: Real-time Ultrasound Guided injection of the left knee Device: Samsung HS60  Verbal informed consent obtained.  Time-out conducted.  Noted no overlying erythema, induration, or other signs of local infection.  Skin prepped in a sterile fashion.  Local anesthesia: Topical Ethyl chloride.  With sterile technique and under real time ultrasound guidance: Noted moderate effusion, 30 mg/2 mL of OrthoVisc (sodium hyaluronate) in a prefilled syringe was injected easily into the knee through a 22-gauge needle. Completed without difficulty  Advised to call if fevers/chills, erythema, induration, drainage, or persistent bleeding.  Images permanently stored and available for review in PACS.  Impression: Technically successful ultrasound guided injection.  Independent interpretation of notes and tests performed by another provider:   None.  Brief History, Exam, Impression, and Recommendations:    Primary osteoarthritis of left knee status post right knee arthroplasty Orthovisc No. 1 of 4 left knee, return in 1 week for #2 of 4.    ____________________________________________ Ihor Austin. Benjamin Stain, M.D., ABFM., CAQSM., AME. Primary Care and Sports Medicine [place] [facility]  Adjunct Professor of Family Medicine  [place] of [facility] [employer]  Restaurant manager, fast food

## 2023-06-07 NOTE — Assessment & Plan Note (Signed)
Orthovisc No. 1 of 4 left knee, return in 1 week for #2 of 4.

## 2023-06-07 NOTE — Addendum Note (Signed)
 Addended by: Samule Dry on: 06/07/2023 03:02 PM   Modules accepted: Orders

## 2023-06-14 ENCOUNTER — Ambulatory Visit: Admitting: Sports Medicine

## 2023-06-18 ENCOUNTER — Other Ambulatory Visit: Payer: Self-pay | Admitting: Sports Medicine

## 2023-06-19 ENCOUNTER — Ambulatory Visit (INDEPENDENT_AMBULATORY_CARE_PROVIDER_SITE_OTHER): Admitting: Sports Medicine

## 2023-06-19 ENCOUNTER — Other Ambulatory Visit (INDEPENDENT_AMBULATORY_CARE_PROVIDER_SITE_OTHER): Payer: Self-pay

## 2023-06-19 DIAGNOSIS — M170 Bilateral primary osteoarthritis of knee: Secondary | ICD-10-CM | POA: Diagnosis not present

## 2023-06-19 MED ORDER — HYALURONAN 30 MG/2ML IX SOSY
15.0000 mg | PREFILLED_SYRINGE | Freq: Once | INTRA_ARTICULAR | Status: AC
  Administered 2023-06-19: 15 mg via INTRA_ARTICULAR

## 2023-06-19 NOTE — Progress Notes (Signed)
    Procedures performed today:    Procedure: Real-time Ultrasound Guided injection of the left knee Device: Samsung HS60  Verbal informed consent obtained.  Time-out conducted.  Noted no overlying erythema, induration, or other signs of local infection.  Skin prepped in a sterile fashion.  Local anesthesia: Topical Ethyl chloride.  With sterile technique and under real time ultrasound guidance: Noted mild effusion, 30 mg/2 mL of OrthoVisc (sodium hyaluronate) in a prefilled syringe was injected easily into the knee through a 22-gauge needle. Completed without difficulty  Advised to call if fevers/chills, erythema, induration, drainage, or persistent bleeding.  Images permanently stored and available for review in PACS.  Impression: Technically successful ultrasound guided injection.  Independent interpretation of notes and tests performed by another provider:   None.  Brief History, Exam, Impression, and Recommendations:    Primary osteoarthritis of left knee status post right knee arthroplasty Orthovisc 2 of 4 left knee, return in a week for 3 of 4.    ____________________________________________ Ihor Austin. Benjamin Stain, M.D., ABFM., CAQSM., AME. Primary Care and Sports Medicine [place] [facility]  Adjunct Professor of Family Medicine  [place] of [facility] of [employer]  Restaurant manager, fast food

## 2023-06-19 NOTE — Addendum Note (Signed)
 Addended by: Samule Dry on: 06/19/2023 02:33 PM   Modules accepted: Orders

## 2023-06-19 NOTE — Assessment & Plan Note (Signed)
 Orthovisc 2 of 4 left knee, return in a week for 3 of 4.

## 2023-06-25 ENCOUNTER — Ambulatory Visit (INDEPENDENT_AMBULATORY_CARE_PROVIDER_SITE_OTHER): Admitting: Sports Medicine

## 2023-06-25 ENCOUNTER — Encounter: Payer: Self-pay | Admitting: Sports Medicine

## 2023-06-25 ENCOUNTER — Other Ambulatory Visit: Payer: Self-pay | Admitting: Family Medicine

## 2023-06-25 ENCOUNTER — Other Ambulatory Visit (INDEPENDENT_AMBULATORY_CARE_PROVIDER_SITE_OTHER)

## 2023-06-25 DIAGNOSIS — M170 Bilateral primary osteoarthritis of knee: Secondary | ICD-10-CM | POA: Diagnosis not present

## 2023-06-25 MED ORDER — HYALURONAN 30 MG/2ML IX SOSY
15.0000 mg | PREFILLED_SYRINGE | Freq: Once | INTRA_ARTICULAR | Status: AC
Start: 2023-06-25 — End: 2023-06-25
  Administered 2023-06-25: 15 mg via INTRA_ARTICULAR

## 2023-06-25 NOTE — Assessment & Plan Note (Signed)
Orthovisc 3 of 4 left knee, return in 1 week for #4 of 4.

## 2023-06-25 NOTE — Progress Notes (Signed)
    Procedures performed today:    Procedure: Real-time Ultrasound Guided injection of the left knee Device: Samsung HS60  Verbal informed consent obtained.  Time-out conducted.  Noted no overlying erythema, induration, or other signs of local infection.  Skin prepped in a sterile fashion.  Local anesthesia: Topical Ethyl chloride.  With sterile technique and under real time ultrasound guidance: Noted mild effusion, 30 mg/2 mL of OrthoVisc (sodium hyaluronate) in a prefilled syringe was injected easily into the knee through a 22-gauge needle. Completed without difficulty  Advised to call if fevers/chills, erythema, induration, drainage, or persistent bleeding.  Images permanently stored and available for review in PACS.  Impression: Technically successful ultrasound guided injection.  Independent interpretation of notes and tests performed by another provider:   None.  Brief History, Exam, Impression, and Recommendations:    Primary osteoarthritis of left knee status post right knee arthroplasty Orthovisc 3 of 4 left knee, return in 1 week for #4 of 4.    ____________________________________________ Ihor Austin. Benjamin Stain, M.D., ABFM., CAQSM., AME. Primary Care and Sports Medicine [place] [facility]  Adjunct Professor of Family Medicine  [place] of [facility] of Medicine  Restaurant manager, fast food

## 2023-06-25 NOTE — Addendum Note (Signed)
 Addended by: Samule Dry on: 06/25/2023 02:42 PM   Modules accepted: Orders

## 2023-06-29 ENCOUNTER — Encounter: Payer: Self-pay | Admitting: Obstetrics and Gynecology

## 2023-07-02 ENCOUNTER — Other Ambulatory Visit (INDEPENDENT_AMBULATORY_CARE_PROVIDER_SITE_OTHER)

## 2023-07-02 ENCOUNTER — Ambulatory Visit: Admitting: Sports Medicine

## 2023-07-02 DIAGNOSIS — M170 Bilateral primary osteoarthritis of knee: Secondary | ICD-10-CM

## 2023-07-02 MED ORDER — HYALURONAN 30 MG/2ML IX SOSY
15.0000 mg | PREFILLED_SYRINGE | Freq: Once | INTRA_ARTICULAR | Status: AC
  Administered 2023-07-02: 15 mg via INTRA_ARTICULAR

## 2023-07-02 NOTE — Addendum Note (Signed)
 Addended by: OLIVA-AVELLANEDA, Ednamae Schiano L on: 07/02/2023 03:49 PM   Modules accepted: Orders

## 2023-07-02 NOTE — Progress Notes (Signed)
    Procedures performed today:    Procedure: Real-time Ultrasound Guided injection of the left knee Device: Samsung HS60  Verbal informed consent obtained.  Time-out conducted.  Noted no overlying erythema, induration, or other signs of local infection.  Skin prepped in a sterile fashion.  Local anesthesia: Topical Ethyl chloride.  With sterile technique and under real time ultrasound guidance: Noted mild effusion, 30 mg/2 mL of OrthoVisc (sodium hyaluronate) in a prefilled syringe was injected easily into the knee through a 22-gauge needle. Completed without difficulty  Advised to call if fevers/chills, erythema, induration, drainage, or persistent bleeding.  Images permanently stored and available for review in PACS.  Impression: Technically successful ultrasound guided injection.  Independent interpretation of notes and tests performed by another provider:   None.  Brief History, Exam, Impression, and Recommendations:    Primary osteoarthritis of left knee status post right knee arthroplasty Orthovisc No. 4 of 4 left knee, return as needed.    ____________________________________________ Joselyn Nicely. Sandy Crumb, M.D., ABFM., CAQSM., AME. Primary Care and Sports Medicine [place] [facility]  Adjunct Professor of [facility] Medicine  [employer]  Restaurant manager, fast food

## 2023-07-02 NOTE — Assessment & Plan Note (Signed)
Orthovisc No. 4 of 4 left knee, return as needed. 

## 2023-07-03 MED ORDER — ESTRADIOL 0.025 MG/24HR TD PTTW: 1.0000 | MEDICATED_PATCH | TRANSDERMAL | 0 refills | Status: DC

## 2023-07-05 ENCOUNTER — Ambulatory Visit (INDEPENDENT_AMBULATORY_CARE_PROVIDER_SITE_OTHER)

## 2023-07-05 VITALS — Ht 63.0 in | Wt 146.0 lb

## 2023-07-05 DIAGNOSIS — Z0000 Encounter for general adult medical examination without abnormal findings: Secondary | ICD-10-CM | POA: Diagnosis not present

## 2023-07-05 NOTE — Patient Instructions (Signed)
  [name] , Thank you for taking time to come for your Medicare Wellness Visit. I appreciate your ongoing commitment to your health goals. Please review the following plan we discussed and let me know if I can assist you in the future.   These are the goals we discussed:  Goals       Patient Stated (pt-stated)      Would like to loose weight 10 lbs.      Patient Stated (pt-stated)      Patient stated that she would like to work on losing weight.      Patient Stated      She would like to continue a healthy lifestyle.         This is a list of the screening recommended for you and due dates:  Health Maintenance  Topic Date Due   COVID-19 Vaccine (6 - 2024-25 season) 11/19/2022   Flu Shot  10/19/2023   Mammogram  11/15/2023   Medicare Annual Wellness Visit  07/04/2024   Colon Cancer Screening  06/04/2026   DTaP/Tdap/Td vaccine (3 - Td or Tdap) 03/21/2029   Pneumonia Vaccine  Completed   DEXA scan (bone density measurement)  Completed   Hepatitis C Screening  Completed   Zoster (Shingles) Vaccine  Completed   HPV Vaccine  Aged Out   Meningitis B Vaccine  Aged Out

## 2023-07-05 NOTE — Progress Notes (Signed)
 Subjective:   [name] is a 73 y.o. female who presents for Medicare Annual (Subsequent) preventive examination.  Visit Complete: Virtual I connected with  [name] on 07/05/23 by a audio enabled telemedicine application and verified that I am speaking with the correct person using two identifiers.  Patient Location: Home  Provider Location: Office/Clinic  I discussed the limitations of evaluation and management by telemedicine. The patient expressed understanding and agreed to proceed.  Vital Signs: Because this visit was a virtual/telehealth visit, some criteria may be missing or patient reported. Any vitals not documented were not able to be obtained and vitals that have been documented are patient reported.  Patient Medicare AWV questionnaire was completed by the patient on n/a; I have confirmed that all information answered by patient is correct and no changes since this date.  Cardiac Risk Factors include: advanced age (>26men, >63 women);hypertension;dyslipidemia     Objective:    Today's Vitals   07/05/23 0801  Weight: 146 lb (66.2 kg)  Height: 5\' 3"  (1.6 m)   Body mass index is 25.86 kg/m.     07/05/2023    8:11 AM 06/27/2022    8:05 AM 02/23/2022    6:26 AM 02/16/2022   11:17 AM 01/03/2022    1:36 PM 06/22/2021    8:07 AM 02/18/2020    8:44 AM  Advanced Directives  Does Patient Have a Medical Advance Directive? Yes Yes No No Yes Yes Yes  Type of Estate agent of [place];Living will Living will   Healthcare Power of Attorney Living will   Does patient want to make changes to medical advance directive? No - Patient declined No - Patient declined    No - Patient declined No - Patient declined  Copy of Healthcare Power of Attorney in Chart? No - copy requested        Would patient like information on creating a medical advance directive?   No - Patient declined No - Patient declined   No - Patient declined    Current Medications  (verified) Outpatient Encounter Medications as of 07/05/2023  Medication Sig   calcium carbonate (OS-CAL) 600 MG tablet Take 600 mg by mouth 2 (two) times daily with a meal.   Cholecalciferol (VITAMIN D3) 125 MCG (5000 UT) CAPS Take 5,000 Units by mouth daily.   esomeprazole (NEXIUM) 40 MG capsule Take 1 capsule (40 mg total) by mouth 2 (two) times daily before a meal.   estradiol (VIVELLE-DOT) 0.025 MG/24HR Place 1 patch onto the skin 2 (two) times a week.   famotidine (PEPCID AC) 10 MG chewable tablet Chew 10 mg by mouth as needed.   magnesium gluconate (MAGONATE) 500 (27 Mg) MG TABS tablet Take 500 mg by mouth daily.   meloxicam (MOBIC) 15 MG tablet TAKE 1 TABLET DAILY   metoprolol succinate (TOPROL-XL) 25 MG 24 hr tablet Take 1 tablet (25 mg total) by mouth daily.   pravastatin (PRAVACHOL) 40 MG tablet TAKE 1 TABLET DAILY   spironolactone (ALDACTONE) 25 MG tablet Take 1 tablet (25 mg total) by mouth 2 (two) times daily.   tiZANidine (ZANAFLEX) 4 MG tablet TAKE 1 TABLET EVERY 6 HOURSAS NEEDED FOR MUSCLE SPASMS   zolpidem (AMBIEN) 10 MG tablet Take 1 tablet (10 mg total) by mouth at bedtime as needed.   Triamcinolone Acetonide (TRIAMCINOLONE 0.1 % CREAM : EUCERIN) CREA Apply 1 application topically 2 (two) times daily. (Patient not taking: Reported on 07/05/2023)   [DISCONTINUED] Misc Natural Products (  PRO NUTRIENTS FRUIT & VEGGIE PO) Take by mouth.   [DISCONTINUED] phentermine (ADIPEX-P) 37.5 MG tablet One tab by mouth qAM   No facility-administered encounter medications on file as of 07/05/2023.    Allergies (verified) Hydrochlorothiazide-triamterene, Tramadol, and Povidone-iodine   History: Past Medical History:  Diagnosis Date   Abnormal vaginal Pap smear    monitored annually by Dr. Konrad Dolores   Colon polyps    multple right colon polyps   Degenerative joint disease    GERD (gastroesophageal reflux disease)    Hyperlipidemia    NMR: LDL 120 (1267/584), HDL 59, TG 60. LDL  goall<130, ideally <100   Hypertension    Hypokalemia    on HCTZ/Triam   PONV (postoperative nausea and vomiting)    Vitamin D deficiency    2000 IU   Past Surgical History:  Procedure Laterality Date   ABDOMINAL HYSTERECTOMY     APPENDECTOMY     BREAST BIOPSY     BUNIONECTOMY  03/2009   CESAREAN SECTION     CHOLECYSTECTOMY  09/2007   Lap, Dr.Gerkin    COLONOSCOPY  2008   virtual colonoscopy   DILATION AND CURETTAGE OF UTERUS     ENDOMETRIAL BIOPSY  04/28/2013   Dr Jennette Kettle   ENDOSCOPIC CONCHA BULLOSA RESECTION Left 03/24/2013   Procedure: ENDOSCOPIC CONCHA BULLOSA RESECTION;  Surgeon: Serena Colonel, MD;  Location: [facility];  Service: ENT;  Laterality: Left;   ETHMOIDECTOMY Left 03/24/2013   Procedure: ETHMOIDECTOMY;  Surgeon: Serena Colonel, MD;  Location: [facility];  Service: ENT;  Laterality: Left;   HAMMER TOE SURGERY     HYSTEROSCOPY     w/ D and C in 09/2005,02/2007,03/2008 (+ poly removal); IDU insertion 09/2008, Dr.Neal   Liposuction and tummy tuck  04/18/2022   Dr. Brennan Bailey   NEURECTOMY FOOT  03/2008   left   NEUROMA SURGERY     left foot ,Dr.Sikora   PARTIAL COLECTOMY Right 03/30/2016   Procedure: RIGHT COLECTOMY;  Surgeon: Darnell Level, MD;  Location: [facility] OR;  Service: General;  Laterality: Right;   S/P total knee replacement, right  Right 01/08/2023   TOE SURGERY     for bone spur   TOTAL SHOULDER ARTHROPLASTY Right 02/18/2020   Procedure: TOTAL SHOULDER ARTHROPLASTY;  Surgeon: Bjorn Pippin, MD;  Location: [facility];  Service: Orthopedics;  Laterality: Right;   TOTAL SHOULDER ARTHROPLASTY Left 02/23/2022   Procedure: TOTAL SHOULDER ARTHROPLASTY;  Surgeon: Bjorn Pippin, MD;  Location: [facility];  Service: Orthopedics;  Laterality: Left;   TUBAL LIGATION     Family History  Problem Relation Age of Onset   Heart attack Father 43       CABG 4-vessel   Diabetes Father        late onset   Lung cancer  Mother        former smoker   Diabetes Mother        late onset   Breast cancer Mother    Obesity Sister    Stroke Paternal Grandfather 50   Lung cancer Maternal Grandfather        former smoker   Breast cancer Maternal Aunt    Heart attack Paternal Grandmother 64   Diabetes type I Other        grand daughter   Social History   Socioeconomic History   Marital status: Married    Spouse name: Reuel Boom   Number of children: 2  Years of education: 75   Highest education level: Associate degree: academic program  Occupational History   Occupation: Engineer, civil (consulting), [employer]   Tobacco Use   Smoking status: Never   Smokeless tobacco: Never  Vaping Use   Vaping status: Never Used  Substance and Sexual Activity   Alcohol use: Yes    Comment: occasional   Drug use: No   Sexual activity: Yes    Birth control/protection: Surgical    Comment: hyst  Other Topics Concern   Not on file  Social History Narrative   Lives with her husband. She enjoys playing golf, bowling, softball and reading.    Social Drivers of Corporate investment banker Strain: Low Risk  (07/05/2023)   Overall Financial Resource Strain (CARDIA)    Difficulty of Paying Living Expenses: Not hard at all  Food Insecurity: No Food Insecurity (07/05/2023)   Hunger Vital Sign    Worried About Running Out of Food in the Last Year: Never true    Ran Out of Food in the Last Year: Never true  Transportation Needs: No Transportation Needs (07/05/2023)   PRAPARE - Administrator, Civil Service (Medical): No    Lack of Transportation (Non-Medical): No  Physical Activity: Sufficiently Active (07/05/2023)   Exercise Vital Sign    Days of Exercise per Week: 4 days    Minutes of Exercise per Session: 90 min  Stress: No Stress Concern Present (07/05/2023)   [employer] of Occupational Health - Occupational Stress Questionnaire    Feeling of Stress : Not at all  Social Connections: Socially Integrated (07/05/2023)    Social Connection and Isolation Panel [NHANES]    Frequency of Communication with Friends and Family: More than three times a week    Frequency of Social Gatherings with Friends and Family: Once a week    Attends Religious Services: More than 4 times per year    Active Member of [employer] or Organizations: Yes    Attends Engineer, structural: More than 4 times per year    Marital Status: Married    Tobacco Counseling Counseling given: Not Answered   Clinical Intake:  Pre-visit preparation completed: Yes  Pain : No/denies pain     BMI - recorded: 25.86 Nutritional Status: BMI 25 -29 Overweight Nutritional Risks: None Diabetes: No  How often do you need to have someone help you when you read instructions, pamphlets, or other written materials from your doctor or pharmacy?: 1 - Never What is the last grade level you completed in school?: 14  Interpreter Needed?: No      Activities of Daily Living    07/05/2023    8:02 AM  In your present state of health, do you have any difficulty performing the following activities:  Hearing? 0  Vision? 0  Difficulty concentrating or making decisions? 0  Walking or climbing stairs? 0  Dressing or bathing? 0  Doing errands, shopping? 0  Preparing Food and eating ? N  Using the Toilet? N  In the past six months, have you accidently leaked urine? Y  Do you have problems with loss of bowel control? N  Managing your Medications? N  Managing your Finances? N  Housekeeping or managing your Housekeeping? N    Patient Care Team: Gean Keels, MD as PCP - General (Sports Medicine) Izell Marsh, MD as Consulting Physician (Obstetrics and Gynecology)  Indicate any recent Medical Services you may have received from other than [facility] providers in the  past year (date may be approximate).     Assessment:   This is a routine wellness examination for [name].  Hearing/Vision screen No results found.   Goals Addressed              This Visit's Progress    Patient Stated       She would like to continue a healthy lifestyle.       Depression Screen    07/05/2023    8:10 AM 02/22/2023   11:22 AM 06/27/2022    8:06 AM 05/22/2022   10:08 AM 07/08/2021   10:37 AM 06/22/2021    8:08 AM 06/02/2020   11:18 AM  PHQ 2/9 Scores  PHQ - 2 Score 0 0 0 0 0 0 0    Fall Risk    07/05/2023    8:11 AM 02/22/2023   11:22 AM 06/27/2022    8:05 AM 05/22/2022   10:08 AM 07/08/2021   10:37 AM  Fall Risk   Falls in the past year? 0 0 1 0 1  Number falls in past yr: 0 0 0 0 0  Injury with Fall? 1 0 0 0 0  Risk for fall due to : No Fall Risks  History of fall(s)  No Fall Risks  Follow up Falls evaluation completed Falls evaluation completed Falls evaluation completed;Education provided;Falls prevention discussed Falls evaluation completed Falls evaluation completed    MEDICARE RISK AT HOME: Medicare Risk at Home Any stairs in or around the home?: Yes If so, are there any without handrails?: Yes Home free of loose throw rugs in walkways, pet beds, electrical cords, etc?: Yes Adequate lighting in your home to reduce risk of falls?: Yes Life alert?: No Use of a cane, walker or w/c?: No Grab bars in the bathroom?: No Shower chair or bench in shower?: Yes Elevated toilet seat or a handicapped toilet?: Yes  TIMED UP AND GO:  Was the test performed?  No    Cognitive Function:        07/05/2023    8:12 AM 06/27/2022    8:12 AM 06/22/2021    8:16 AM  6CIT Screen  What Year? 0 points 0 points 0 points  What month? 0 points 0 points 0 points  What time? 0 points 0 points 0 points  Count back from 20 0 points 0 points 0 points  Months in reverse 0 points 0 points 0 points  Repeat phrase 2 points 2 points 2 points  Total Score 2 points 2 points 2 points    Immunizations Immunization History  Administered Date(s) Administered   Fluad Quad(high Dose 65+) 12/31/2018   H1N1 04/22/2008   Influenza Whole 12/19/2011    Influenza, High Dose Seasonal PF 01/04/2018, 04/16/2023   Influenza-Unspecified 12/19/2014, 12/19/2015, 12/18/2016, 01/26/2021   Moderna Covid-19 Vaccine Bivalent Booster 57yrs & up 01/24/2021   Moderna Sars-Covid-2 Vaccination 01/27/2020   PFIZER(Purple Top)SARS-COV-2 Vaccination 05/22/2019, 06/18/2019   PNEUMOCOCCAL CONJUGATE-20 06/01/2021   Pfizer(Comirnaty)Fall Seasonal Vaccine 12 years and older 01/31/2022   Pneumococcal Polysaccharide-23 01/04/2018, 04/08/2019   Td 01/25/2005   Tdap 03/22/2019   Zoster Recombinant(Shingrix) 06/01/2021, 08/23/2021   Zoster, Live 05/09/2010    TDAP status: Up to date  Flu Vaccine status: Up to date  Pneumococcal vaccine status: Up to date  Covid-19 vaccine status: Information provided on how to obtain vaccines.   Qualifies for Shingles Vaccine? Yes   Zostavax completed Yes   Shingrix Completed?: Yes  Screening Tests Health Maintenance  Topic Date  Due   COVID-19 Vaccine (6 - 2024-25 season) 11/19/2022   INFLUENZA VACCINE  10/19/2023   MAMMOGRAM  11/15/2023   Medicare Annual Wellness (AWV)  07/04/2024   Colonoscopy  06/04/2026   DTaP/Tdap/Td (3 - Td or Tdap) 03/21/2029   Pneumonia Vaccine 58+ Years old  Completed   DEXA SCAN  Completed   Hepatitis C Screening  Completed   Zoster Vaccines- Shingrix  Completed   HPV VACCINES  Aged Out   Meningococcal B Vaccine  Aged Out    Health Maintenance  Health Maintenance Due  Topic Date Due   COVID-19 Vaccine (6 - 2024-25 season) 11/19/2022    Colorectal cancer screening: Type of screening: Colonoscopy. Completed 06/04/2023. Repeat every 5 years  Mammogram status: Completed 11/15/2022. Repeat every year  Bone Density status: Completed 11/01/2021. Results reflect: Bone density results: OSTEOPENIA. Repeat every 2 years.  Lung Cancer Screening: (Low Dose CT Chest recommended if Age 80-80 years, 20 pack-year currently smoking OR have quit w/in 15years.) does not qualify.   Lung Cancer  Screening Referral: n/a  Additional Screening:  Hepatitis C Screening: does qualify; Completed 05/17/2017  Vision Screening: Recommended annual ophthalmology exams for early detection of glaucoma and other disorders of the eye. Is the patient up to date with their annual eye exam?  Yes  Who is the provider or what is the name of the office in which the patient attends annual eye exams? [facility] If pt is not established with a provider, would they like to be referred to a provider to establish care?  N/a .   Dental Screening: Recommended annual dental exams for proper oral hygiene    Community Resource Referral / Chronic Care Management: CRR required this visit?  No   CCM required this visit?  No     Plan:     I have personally reviewed and noted the following in the patient's chart:   Medical and social history Use of alcohol, tobacco or illicit drugs  Current medications and supplements including opioid prescriptions. Patient is not currently taking opioid prescriptions. Functional ability and status Nutritional status Physical activity Advanced directives List of other physicians Hospitalizations, surgeries, and ER visits in previous 12 months - 1 Surgery. No ER or Hospitalization  Vitals Screenings to include cognitive, depression, and falls Referrals and appointments  In addition, I have reviewed and discussed with patient certain preventive protocols, quality metrics, and best practice recommendations. A written personalized care plan for preventive services as well as general preventive health recommendations were provided to patient.     Aubrey Leaf, CMA   07/05/2023   After Visit Summary: (MyChart) Due to this being a telephonic visit, the after visit summary with patients personalized plan was offered to patient via MyChart   Nurse Notes:   [name] is a 73 y.o. female patient of Thekkekandam, Joselyn Nicely, MD who had a Medicare Annual Wellness Visit  today via telephone. [name] is Retired and lives with their spouse. She has 2 children. She reports that she is socially active and does interact with friends/family regularly. She is moderately physically active and enjoys playing golf, bowling, softball and reading.

## 2023-07-16 NOTE — Telephone Encounter (Signed)
 Prior auth for: NEXIUM  Determination: Pending  Auth #: [id number] Valid from: n/a

## 2023-07-17 ENCOUNTER — Other Ambulatory Visit (HOSPITAL_COMMUNITY): Payer: Self-pay

## 2023-07-23 NOTE — Telephone Encounter (Signed)
 Prior auth for: NEXIUM  Determination: APPROVED Auth #: [id number] Valid from: 03/21/23 - 03/19/24

## 2023-07-31 ENCOUNTER — Other Ambulatory Visit: Payer: Self-pay | Admitting: Sports Medicine

## 2023-08-22 ENCOUNTER — Ambulatory Visit (INDEPENDENT_AMBULATORY_CARE_PROVIDER_SITE_OTHER): Admitting: Sports Medicine

## 2023-08-22 ENCOUNTER — Encounter: Payer: Self-pay | Admitting: Sports Medicine

## 2023-08-22 ENCOUNTER — Other Ambulatory Visit (INDEPENDENT_AMBULATORY_CARE_PROVIDER_SITE_OTHER)

## 2023-08-22 DIAGNOSIS — G5601 Carpal tunnel syndrome, right upper limb: Secondary | ICD-10-CM | POA: Diagnosis not present

## 2023-08-22 MED ORDER — TRIAMCINOLONE ACETONIDE 40 MG/ML IJ SUSP
40.0000 mg | Freq: Once | INTRAMUSCULAR | Status: AC
  Administered 2023-08-22: 40 mg via INTRAMUSCULAR

## 2023-08-22 NOTE — Progress Notes (Signed)
    Procedures performed today:    Procedure: Real-time Ultrasound Guided hydrodissection of the right median nerve at the carpal tunnel Device: Samsung HS60 Verbal informed consent obtained.  Time-out conducted.  Noted no overlying erythema, induration, or other signs of local infection.  Skin prepped in a sterile fashion.  Local anesthesia: Topical Ethyl chloride.  With sterile technique and under real time ultrasound guidance: Noted enlarged nerve, using a 25-gauge needle advanced into the carpal tunnel, taking care to avoid intraneural injection I injected medication both superficial to and deep to the median nerve freeing it from surrounding structures, I then redirected the needle deep and injected further medication around the flexor tendons deep within the carpal tunnel for a total of 1 cc kenalog  40, 5 cc 1% lidocaine  without epinephrine . Completed without difficulty  Advised to call if fevers/chills, erythema, induration, drainage, or persistent bleeding.  Images permanently stored and available for review in PACS.  Impression: Technically successful ultrasound guided median nerve hydrodissection.  Independent interpretation of notes and tests performed by another provider:   None.  Brief History, Exam, Impression, and Recommendations:    Carpal tunnel syndrome on right Increasing carpal tunnel symptoms, last injection was March 2024, repeat right median nerve hydrodissection today, return to see me as needed.    ____________________________________________ Joselyn Nicely. Sandy Crumb, M.D., ABFM., CAQSM., AME. Primary Care and Sports Medicine [facility]  Adjunct Professor of [facility]  [employer]  Restaurant manager, fast food

## 2023-08-22 NOTE — Assessment & Plan Note (Signed)
 Increasing carpal tunnel symptoms, last injection was March 2024, repeat right median nerve hydrodissection today, return to see me as needed.

## 2023-08-22 NOTE — Addendum Note (Signed)
 Addended by: OLIVA-AVELLANEDA, Othman Masur L on: 08/22/2023 09:54 AM   Modules accepted: Orders

## 2023-09-09 ENCOUNTER — Other Ambulatory Visit: Payer: Self-pay | Admitting: Sports Medicine

## 2023-09-17 ENCOUNTER — Encounter: Payer: Self-pay | Admitting: Sports Medicine

## 2023-09-17 DIAGNOSIS — I10 Essential (primary) hypertension: Secondary | ICD-10-CM

## 2023-09-17 MED ORDER — METOPROLOL SUCCINATE ER 25 MG PO TB24: 25.0000 mg | ORAL_TABLET | Freq: Every day | ORAL | 3 refills | Status: AC

## 2023-09-21 ENCOUNTER — Other Ambulatory Visit: Payer: Self-pay | Admitting: Obstetrics and Gynecology

## 2023-09-26 ENCOUNTER — Ambulatory Visit (INDEPENDENT_AMBULATORY_CARE_PROVIDER_SITE_OTHER): Admitting: Family Medicine

## 2023-09-26 ENCOUNTER — Encounter: Payer: Self-pay | Admitting: Family Medicine

## 2023-09-26 ENCOUNTER — Telehealth: Payer: Self-pay

## 2023-09-26 VITALS — BP 128/74 | HR 81 | Temp 98.1°F | Ht 63.0 in | Wt 152.1 lb

## 2023-09-26 DIAGNOSIS — R399 Unspecified symptoms and signs involving the genitourinary system: Secondary | ICD-10-CM

## 2023-09-26 LAB — POCT URINALYSIS DIP (CLINITEK)
Bilirubin, UA: NEGATIVE
Glucose, UA: NEGATIVE mg/dL
Ketones, POC UA: NEGATIVE mg/dL
Nitrite, UA: NEGATIVE
POC PROTEIN,UA: NEGATIVE
Spec Grav, UA: <=1.005 — AB (ref 1.010–1.025)
Urobilinogen, UA: 0.2 U/dL
pH, UA: 6 (ref 5.0–8.0)

## 2023-09-26 MED ORDER — NITROFURANTOIN MONOHYD MACRO 100 MG PO CAPS: 100.0000 mg | ORAL_CAPSULE | Freq: Two times a day (BID) | ORAL | 0 refills | Status: AC

## 2023-09-26 NOTE — Progress Notes (Signed)
 Acute Office Visit  Subjective:     Patient ID: [name], female    DOB: 10/03/1950, 73 y.o.   MRN: [id number]  Chief Complaint  Patient presents with   Urinary Tract Infection    Per patient - sxs started today. She is not voiding completely.    Urinary Tract Infection  Associated symptoms include urgency. Pertinent negatives include no flank pain, frequency or hematuria.   Patient is in today for acute visit.  Pt reports around 9am, she started feeling her bladder was full. She went outside to mow the lawn and came back inside. Felt like she had to go but not much came out. She has some urgency but no pain with urination. She did have some dark urine. She does drink water daily, at least 3 glasses of water a day. She doesn't have frequent UTIs. She reports the last one was when her daughter was born and she's 58 y.o.  She has never had kidney stones.   Review of Systems  Genitourinary:  Positive for urgency. Negative for dysuria, flank pain, frequency and hematuria.  All other systems reviewed and are negative.       Objective:    BP 128/74 (BP Location: Left Arm, Patient Position: Sitting, Cuff Size: Normal)   Pulse 81   Temp 98.1 F (36.7 C) (Oral)   Ht 5' 3 (1.6 m)   Wt 152 lb 1.6 oz (69 kg)   SpO2 98%   BMI 26.94 kg/m    Physical Exam Vitals and nursing note reviewed.  Constitutional:      Appearance: Normal appearance. She is normal weight.  HENT:     Head: Normocephalic and atraumatic.     Right Ear: External ear normal.     Left Ear: External ear normal.     Nose: Nose normal.     Mouth/Throat:     Mouth: Mucous membranes are moist.     Pharynx: Oropharynx is clear.  Eyes:     Conjunctiva/sclera: Conjunctivae normal.     Pupils: Pupils are equal, round, and reactive to light.  Cardiovascular:     Rate and Rhythm: Normal rate and regular rhythm.  Pulmonary:     Effort: Pulmonary effort is normal.     Breath sounds: Normal breath sounds.   Abdominal:     General: Abdomen is flat. Bowel sounds are normal. There is no distension.     Palpations: Abdomen is soft.     Tenderness: There is no abdominal tenderness. There is no guarding.  Skin:    General: Skin is warm.     Capillary Refill: Capillary refill takes less than 2 seconds.  Neurological:     General: No focal deficit present.     Mental Status: She is alert and oriented to person, place, and time. Mental status is at baseline.  Psychiatric:        Mood and Affect: Mood normal.        Behavior: Behavior normal.        Thought Content: Thought content normal.        Judgment: Judgment normal.    No results found for any visits on 09/26/23.      Assessment & Plan:   Problem List Items Addressed This Visit   None Visit Diagnoses       UTI symptoms    -  Primary   Relevant Orders   POCT URINALYSIS DIP (CLINITEK)       No orders of  the defined types were placed in this encounter.  UTI symptoms -     POCT URINALYSIS DIP (CLINITEK) -     Urine Culture -     Nitrofurantoin  Monohyd Macro; Take 1 capsule (100 mg total) by mouth 2 (two) times daily.  Dispense: 14 capsule; Refill: 0   Pt with UTI symptoms. Urine dip + for blood and leuks. Send for culture. Empirically treat with Macrobid  100mg  BID x 7 days. Follow up on culture and symptoms. If not helping or culture inconclusive, consider kidney stones due to urinary urgency and large blood? No follow-ups on file.  Torrence CINDERELLA Barrier, MD

## 2023-09-26 NOTE — Telephone Encounter (Signed)
 Spoke with patient. C/o   urinary urgency with little production  x 9 am this morning .  Scheduled patient with Dr. Colette at 1:10pm today

## 2023-09-30 LAB — URINE CULTURE

## 2023-10-01 ENCOUNTER — Ambulatory Visit: Payer: Self-pay | Admitting: Family Medicine

## 2023-10-02 ENCOUNTER — Other Ambulatory Visit: Payer: Self-pay | Admitting: Sports Medicine

## 2023-10-02 DIAGNOSIS — Z13820 Encounter for screening for osteoporosis: Secondary | ICD-10-CM

## 2023-10-25 ENCOUNTER — Encounter (INDEPENDENT_AMBULATORY_CARE_PROVIDER_SITE_OTHER): Payer: Self-pay | Admitting: Sports Medicine

## 2023-10-25 DIAGNOSIS — L821 Other seborrheic keratosis: Secondary | ICD-10-CM

## 2023-10-25 NOTE — Telephone Encounter (Signed)

## 2023-10-29 ENCOUNTER — Other Ambulatory Visit: Payer: Self-pay | Admitting: Radiology

## 2023-11-20 ENCOUNTER — Encounter: Payer: Self-pay | Admitting: Sports Medicine

## 2023-12-05 ENCOUNTER — Ambulatory Visit

## 2023-12-05 ENCOUNTER — Ambulatory Visit (INDEPENDENT_AMBULATORY_CARE_PROVIDER_SITE_OTHER)

## 2023-12-05 DIAGNOSIS — Z13820 Encounter for screening for osteoporosis: Secondary | ICD-10-CM

## 2023-12-06 ENCOUNTER — Ambulatory Visit (INDEPENDENT_AMBULATORY_CARE_PROVIDER_SITE_OTHER)

## 2023-12-06 DIAGNOSIS — Z13820 Encounter for screening for osteoporosis: Secondary | ICD-10-CM | POA: Diagnosis not present

## 2023-12-06 DIAGNOSIS — Z1231 Encounter for screening mammogram for malignant neoplasm of breast: Secondary | ICD-10-CM | POA: Diagnosis not present

## 2023-12-13 ENCOUNTER — Other Ambulatory Visit: Payer: Self-pay

## 2023-12-13 MED ORDER — MELOXICAM 15 MG PO TABS: 15.0000 mg | ORAL_TABLET | Freq: Every day | ORAL | 0 refills | Status: DC

## 2023-12-16 ENCOUNTER — Other Ambulatory Visit: Payer: Self-pay | Admitting: Family Medicine

## 2023-12-17 ENCOUNTER — Encounter: Payer: Self-pay | Admitting: Family Medicine

## 2023-12-17 ENCOUNTER — Telehealth: Payer: Self-pay

## 2023-12-17 ENCOUNTER — Other Ambulatory Visit: Payer: Self-pay

## 2023-12-17 DIAGNOSIS — M47816 Spondylosis without myelopathy or radiculopathy, lumbar region: Secondary | ICD-10-CM

## 2023-12-17 MED ORDER — MELOXICAM 15 MG PO TABS: 15.0000 mg | ORAL_TABLET | Freq: Every day | ORAL | 0 refills | Status: AC

## 2023-12-17 NOTE — Telephone Encounter (Signed)
 Spoke with patient and medication was sent to correct pharmacy . [facility].  Spoke with [facility] and had original prescription cancelled.

## 2023-12-17 NOTE — Telephone Encounter (Signed)
 Called and LVM  for patient to return call to discuss medication Meloxicam . Patient medication was filled 90 day supply at [facility] on 12/13/2023, by Zada Palin, Called [facility] and patient did not pick medication up. Patient has new request for a refill for Meloxicam  90 day supply to go to [facility]. Just needing to verify information regarding Prescription and correct Pharmacy

## 2023-12-18 ENCOUNTER — Encounter: Payer: Self-pay | Admitting: Obstetrics and Gynecology

## 2023-12-18 DIAGNOSIS — N951 Menopausal and female climacteric states: Secondary | ICD-10-CM

## 2023-12-20 MED ORDER — ESTRADIOL 0.025 MG/24HR TD PTTW: 1.0000 | MEDICATED_PATCH | TRANSDERMAL | 5 refills | Status: AC

## 2023-12-26 ENCOUNTER — Encounter: Payer: Self-pay | Admitting: Family Medicine

## 2023-12-26 ENCOUNTER — Telehealth: Payer: Self-pay

## 2023-12-26 NOTE — Telephone Encounter (Signed)
 Would you please assist thie patient in scheduling a TOC to Dr. Colette ?

## 2023-12-26 NOTE — Telephone Encounter (Signed)
 Copied from CRM #[id number]. Topic: Appointments - Transfer of Care >> Dec 26, 2023 10:30 AM [name] wrote: Pt is requesting to transfer FROM: [name] Pt is requesting to transfer TO: Colette Menghini  Reason for requested transfer: Change in insurance It is the responsibility of the team the patient would like to transfer to (Dr. Colette, Menghini ) to reach out to the patient if for any reason this transfer is not acceptable.

## 2024-01-04 DIAGNOSIS — Z96651 Presence of right artificial knee joint: Secondary | ICD-10-CM | POA: Diagnosis not present

## 2024-01-14 ENCOUNTER — Other Ambulatory Visit: Payer: Self-pay | Admitting: Family Medicine

## 2024-01-14 MED ORDER — SPIRONOLACTONE 25 MG PO TABS: 25.0000 mg | ORAL_TABLET | Freq: Two times a day (BID) | ORAL | 1 refills | Status: AC

## 2024-02-05 DIAGNOSIS — H353131 Nonexudative age-related macular degeneration, bilateral, early dry stage: Secondary | ICD-10-CM | POA: Diagnosis not present

## 2024-02-05 DIAGNOSIS — H25813 Combined forms of age-related cataract, bilateral: Secondary | ICD-10-CM | POA: Diagnosis not present

## 2024-02-28 DIAGNOSIS — M1712 Unilateral primary osteoarthritis, left knee: Secondary | ICD-10-CM | POA: Diagnosis not present

## 2024-03-07 ENCOUNTER — Other Ambulatory Visit: Payer: Self-pay | Admitting: Family Medicine

## 2024-03-07 DIAGNOSIS — M47816 Spondylosis without myelopathy or radiculopathy, lumbar region: Secondary | ICD-10-CM

## 2024-03-31 ENCOUNTER — Encounter: Admitting: Family Medicine

## 2024-04-06 ENCOUNTER — Other Ambulatory Visit: Payer: Self-pay | Admitting: Family Medicine

## 2024-04-06 DIAGNOSIS — M47816 Spondylosis without myelopathy or radiculopathy, lumbar region: Secondary | ICD-10-CM

## 2024-05-06 ENCOUNTER — Encounter: Admitting: Family Medicine

## 2024-07-09 ENCOUNTER — Ambulatory Visit
